# Patient Record
Sex: Male | Born: 1935 | Race: Black or African American | Hispanic: No | Smoking: Former smoker
Health system: Southern US, Community
[De-identification: ages and names within clinical notes are randomized; demographics above are authoritative.]

## PROBLEM LIST (undated history)

## (undated) DIAGNOSIS — E785 Hyperlipidemia, unspecified: Secondary | ICD-10-CM

## (undated) DIAGNOSIS — Z720 Tobacco use: Secondary | ICD-10-CM

## (undated) DIAGNOSIS — I2510 Atherosclerotic heart disease of native coronary artery without angina pectoris: Secondary | ICD-10-CM

## (undated) DIAGNOSIS — M1990 Unspecified osteoarthritis, unspecified site: Secondary | ICD-10-CM

## (undated) DIAGNOSIS — I739 Peripheral vascular disease, unspecified: Secondary | ICD-10-CM

## (undated) DIAGNOSIS — I219 Acute myocardial infarction, unspecified: Secondary | ICD-10-CM

## (undated) DIAGNOSIS — I10 Essential (primary) hypertension: Secondary | ICD-10-CM

## (undated) DIAGNOSIS — D649 Anemia, unspecified: Secondary | ICD-10-CM

## (undated) DIAGNOSIS — N184 Chronic kidney disease, stage 4 (severe): Secondary | ICD-10-CM

## (undated) DIAGNOSIS — I214 Non-ST elevation (NSTEMI) myocardial infarction: Secondary | ICD-10-CM

## (undated) HISTORY — DX: Essential (primary) hypertension: I10

## (undated) HISTORY — DX: Unspecified osteoarthritis, unspecified site: M19.90

## (undated) HISTORY — DX: Hyperlipidemia, unspecified: E78.5

## (undated) HISTORY — PX: OTHER SURGICAL HISTORY: SHX169

## (undated) HISTORY — DX: Non-ST elevation (NSTEMI) myocardial infarction: I21.4

## (undated) HISTORY — PX: REPAIR OF PERFORATED ULCER: SHX6065

---

## 2012-02-06 ENCOUNTER — Encounter: Payer: Self-pay | Admitting: Physician Assistant

## 2012-02-06 ENCOUNTER — Inpatient Hospital Stay (HOSPITAL_COMMUNITY)
Admission: AD | Admit: 2012-02-06 | Discharge: 2012-02-12 | DRG: 247 | Disposition: A | Discharge status: Home / Self Care | Payer: Medicare Other | Source: Other Acute Inpatient Hospital | Attending: Cardiology | Admitting: Cardiology

## 2012-02-06 ENCOUNTER — Other Ambulatory Visit: Payer: Self-pay | Admitting: Physician Assistant

## 2012-02-06 ENCOUNTER — Other Ambulatory Visit: Payer: Self-pay | Admitting: Cardiology

## 2012-02-06 DIAGNOSIS — I739 Peripheral vascular disease, unspecified: Secondary | ICD-10-CM

## 2012-02-06 DIAGNOSIS — D638 Anemia in other chronic diseases classified elsewhere: Secondary | ICD-10-CM

## 2012-02-06 DIAGNOSIS — I214 Non-ST elevation (NSTEMI) myocardial infarction: Secondary | ICD-10-CM

## 2012-02-06 DIAGNOSIS — I129 Hypertensive chronic kidney disease with stage 1 through stage 4 chronic kidney disease, or unspecified chronic kidney disease: Secondary | ICD-10-CM | POA: Diagnosis present

## 2012-02-06 DIAGNOSIS — I200 Unstable angina: Secondary | ICD-10-CM

## 2012-02-06 DIAGNOSIS — I2510 Atherosclerotic heart disease of native coronary artery without angina pectoris: Principal | ICD-10-CM | POA: Diagnosis present

## 2012-02-06 DIAGNOSIS — N179 Acute kidney failure, unspecified: Secondary | ICD-10-CM | POA: Diagnosis present

## 2012-02-06 DIAGNOSIS — N184 Chronic kidney disease, stage 4 (severe): Secondary | ICD-10-CM

## 2012-02-06 DIAGNOSIS — N039 Chronic nephritic syndrome with unspecified morphologic changes: Secondary | ICD-10-CM | POA: Diagnosis present

## 2012-02-06 DIAGNOSIS — D631 Anemia in chronic kidney disease: Secondary | ICD-10-CM | POA: Diagnosis present

## 2012-02-06 DIAGNOSIS — E872 Acidosis, unspecified: Secondary | ICD-10-CM | POA: Diagnosis present

## 2012-02-06 DIAGNOSIS — E785 Hyperlipidemia, unspecified: Secondary | ICD-10-CM

## 2012-02-06 DIAGNOSIS — Z720 Tobacco use: Secondary | ICD-10-CM

## 2012-02-06 DIAGNOSIS — F17200 Nicotine dependence, unspecified, uncomplicated: Secondary | ICD-10-CM | POA: Diagnosis present

## 2012-02-06 DIAGNOSIS — Z955 Presence of coronary angioplasty implant and graft: Secondary | ICD-10-CM

## 2012-02-06 DIAGNOSIS — R079 Chest pain, unspecified: Secondary | ICD-10-CM

## 2012-02-06 DIAGNOSIS — Z7982 Long term (current) use of aspirin: Secondary | ICD-10-CM

## 2012-02-06 DIAGNOSIS — I10 Essential (primary) hypertension: Secondary | ICD-10-CM

## 2012-02-06 DIAGNOSIS — Z79899 Other long term (current) drug therapy: Secondary | ICD-10-CM

## 2012-02-06 DIAGNOSIS — I959 Hypotension, unspecified: Secondary | ICD-10-CM | POA: Diagnosis present

## 2012-02-06 HISTORY — DX: Anemia, unspecified: D64.9

## 2012-02-06 HISTORY — DX: Peripheral vascular disease, unspecified: I73.9

## 2012-02-06 HISTORY — DX: Hyperlipidemia, unspecified: E78.5

## 2012-02-06 HISTORY — DX: Atherosclerotic heart disease of native coronary artery without angina pectoris: I25.10

## 2012-02-06 HISTORY — DX: Tobacco use: Z72.0

## 2012-02-06 HISTORY — DX: Chronic kidney disease, stage 4 (severe): N18.4

## 2012-02-06 MED ORDER — ASPIRIN 81 MG PO CHEW
324.0000 mg | CHEWABLE_TABLET | ORAL | Status: AC
  Administered 2012-02-09: 324 mg via ORAL
  Filled 2012-02-06: qty 4

## 2012-02-06 MED ORDER — HEPARIN (PORCINE) IN NACL 100-0.45 UNIT/ML-% IJ SOLN
1000.0000 [IU]/h | INTRAMUSCULAR | Status: DC
  Administered 2012-02-06: 1000 [IU]/h via INTRAVENOUS
  Administered 2012-02-07 – 2012-02-08 (×2): 1200 [IU]/h via INTRAVENOUS
  Filled 2012-02-06 (×4): qty 250

## 2012-02-06 MED ORDER — ATORVASTATIN CALCIUM 80 MG PO TABS
80.0000 mg | ORAL_TABLET | ORAL | Status: AC
  Administered 2012-02-06: 80 mg via ORAL
  Filled 2012-02-06: qty 1

## 2012-02-06 MED ORDER — AMLODIPINE BESYLATE 10 MG PO TABS
10.0000 mg | ORAL_TABLET | Freq: Every day | ORAL | Status: DC
  Administered 2012-02-07 – 2012-02-12 (×6): 10 mg via ORAL
  Filled 2012-02-06 (×6): qty 1

## 2012-02-06 MED ORDER — ACETAMINOPHEN 325 MG PO TABS
650.0000 mg | ORAL_TABLET | ORAL | Status: DC | PRN
  Administered 2012-02-07: 650 mg via ORAL
  Filled 2012-02-06: qty 2

## 2012-02-06 MED ORDER — SODIUM CHLORIDE 0.9 % IJ SOLN
3.0000 mL | Freq: Two times a day (BID) | INTRAMUSCULAR | Status: DC
  Administered 2012-02-06 – 2012-02-10 (×7): 3 mL via INTRAVENOUS

## 2012-02-06 MED ORDER — SODIUM CHLORIDE 0.9 % IV SOLN: 250.0000 mL | INTRAVENOUS | Status: DC | PRN

## 2012-02-06 MED ORDER — ASPIRIN EC 81 MG PO TBEC
81.0000 mg | DELAYED_RELEASE_TABLET | Freq: Every day | ORAL | Status: DC
  Administered 2012-02-07 – 2012-02-12 (×5): 81 mg via ORAL
  Filled 2012-02-06 (×6): qty 1

## 2012-02-06 MED ORDER — ATORVASTATIN CALCIUM 80 MG PO TABS
80.0000 mg | ORAL_TABLET | Freq: Every day | ORAL | Status: AC
  Administered 2012-02-07 – 2012-02-09 (×3): 80 mg via ORAL
  Filled 2012-02-06 (×3): qty 1

## 2012-02-06 MED ORDER — ATORVASTATIN CALCIUM 80 MG PO TABS
80.0000 mg | ORAL_TABLET | Freq: Every day | ORAL | Status: DC
  Administered 2012-02-10 – 2012-02-11 (×2): 80 mg via ORAL
  Filled 2012-02-06 (×4): qty 1

## 2012-02-06 MED ORDER — METOPROLOL TARTRATE 25 MG PO TABS
25.0000 mg | ORAL_TABLET | Freq: Two times a day (BID) | ORAL | Status: DC
  Administered 2012-02-06 – 2012-02-12 (×12): 25 mg via ORAL
  Filled 2012-02-06 (×9): qty 1
  Filled 2012-02-06: qty 2
  Filled 2012-02-06 (×4): qty 1

## 2012-02-06 MED ORDER — NITROGLYCERIN 0.4 MG SL SUBL
0.4000 mg | SUBLINGUAL_TABLET | SUBLINGUAL | Status: DC | PRN
  Administered 2012-02-07 (×2): 0.4 mg via SUBLINGUAL

## 2012-02-06 MED ORDER — SODIUM CHLORIDE 0.9 % IV SOLN
INTRAVENOUS | Status: DC
  Administered 2012-02-06: 50 mL via INTRAVENOUS
  Administered 2012-02-07: 15:00:00 via INTRAVENOUS
  Administered 2012-02-08: 50 mL via INTRAVENOUS
  Administered 2012-02-09: 06:00:00 via INTRAVENOUS

## 2012-02-06 MED ORDER — ONDANSETRON HCL 4 MG/2ML IJ SOLN: 4.0000 mg | Freq: Four times a day (QID) | INTRAMUSCULAR | Status: DC | PRN

## 2012-02-06 MED ORDER — SODIUM CHLORIDE 0.9 % IJ SOLN: 3.0000 mL | INTRAMUSCULAR | Status: DC | PRN

## 2012-02-06 NOTE — Progress Notes (Signed)
ANTICOAGULATION CONSULT NOTE - Initial Consult  Pharmacy Consult for heparin Indication: chect pain, r/o PE  Allergies not on file  Patient Measurements: Height: 5\' 6"  (167.6 cm) Weight: 185 lb (83.915 kg) IBW/kg (Calculated) : 63.8  Heparin Dosing Weight: 71 kg  Vital Signs: Temp: 98.6 F (37 C) (01/10 1543) Temp src: Oral (01/10 1543) BP: 143/59 mmHg (01/10 1543) Pulse Rate: 67  (01/10 1543)  Labs: No results found for this basename: HGB:2,HCT:3,PLT:3,APTT:3,LABPROT:3,INR:3,HEPARINUNFRC:3,CREATININE:3,CKTOTAL:3,CKMB:3,TROPONINI:3 in the last 72 hours  CrCl is unknown because no creatinine reading has been taken.   Medical History: No past medical history on file.  Medications:  Prescriptions prior to admission  Medication Sig Dispense Refill  . amLODipine (NORVASC) 10 MG tablet Take 10 mg by mouth daily.      Marland Kitchen aspirin EC 81 MG tablet Take 81 mg by mouth daily.      . calcium-vitamin D (OSCAL WITH D) 500-200 MG-UNIT per tablet Take 1 tablet by mouth 3 (three) times daily.      . hydrALAZINE (APRESOLINE) 25 MG tablet Take 25 mg by mouth 2 (two) times daily.      . metoprolol succinate (TOPROL-XL) 25 MG 24 hr tablet Take 25 mg by mouth daily.        Assessment: 79 yo man to start heparin for CP.  He received lovenox 90 mg this am at [facility].   VQ scan was low probability for PE.  CrCl ~ 42 ml/min. Goal of Therapy:  Heparin level 0.3-0.7 units/ml Monitor platelets by anticoagulation protocol: Yes   Plan:  Will start heparin with no bolus as received lovenox earlier today. Heparin drip at 1000 units/hr Check heparin level and CBC 8 hours after start and daily while on heparin.  Excell Seltzer Poteet 02/06/2012,4:22 PM

## 2012-02-06 NOTE — H&P (Signed)
Kevin [name], Kevin [name]  [id number]     Kevin [name]                    ROOM:          305-01                UNIT NUMBER:  [id number]                        LOCATION:      81F         ADM/VISIT DATE:     02/06/12                ADM PHYS:      Kevin Chroman MD       ACCT: [id number]                               DOB:           06-03-2030   CARDIOLOGY CONSULTATION REPORT   CONSULTING CARDIOLOGIST:  Kevin [name], M.D. (new).   REFERRING PHYSICIAN:  Jerene [name], M.D.   REASON FOR CONSULTATION:  Mr. Kevin [name] is a very pleasant 79 year old male, with no known history of heart disease, but several cardiac risk factors, including longstanding tobacco smoking, who presented to the ED late last night with the complaint of severe chest pain.  He is now referred to Kevin [name] for further evaluation.   Patient experienced sudden, severe midsternal chest pain (8/10) at 9:00 p.m. last evening, while he was sitting and watching television.  He referred to this as dull and was associated with radiation to the left biceps, as well as diaphoresis, dyspnea, nausea/vomiting.  He states that he had a similar episode 1 month ago, when he was briefly hospitalized here with acute/chronic renal failure and hyperkalemia.   Patient denies any antecedent history of exertional angina, recent development of exertional dyspnea.  His cardiac risk factors are notable for HTN, longstanding tobacco smoking, HLD, and age.  He denies ever having had a stress test.  Of note, during his recent hospitalization he did have an echocardiogram, reviewed by Kevin [name], which was essentially within normal limits.   Patient was essentially pain free by the time of arrival to the ED, and he was treated with morphine and Lovenox.  He presented with a blood pressure of 108/74.  He is currently pain free, and 1 set of cardiac markers was  drawn, notable for normal CPK-MB with a troponin I of 0.04.   Admission EKG notable for normal sinus rhythm with short PR interval, but no deltoid wave, and nonspecific ST changes.   ALLERGIES:  No known drug allergies.   HOME MEDICATIONS: 1.   Lopressor 25 mg daily. 2.   Gemfibrozil 600 mg b.i.d. 3.   Norvasc 10 mg daily. 4.   Aspirin 81 mg daily. 5.   Hydralazine 25 mg b.i.d.   PAST MEDICAL HISTORY: 1.   HTN. 2.   HLD. 3.  Tobacco. 4.   Chronic kidney disease.      a.   Followed by Kevin [name]. 5.   Chronic anemia. 6.   Ascending aorta dilatation, mild.           a.   2-D echocardiogram, 12/2011.   PAST SURGICAL HISTORY:  Bilateral carpal tunnel surgery.   SOCIAL HISTORY:  Patient lives here in [place], alone.  He has 5 children, 4 grandchildren and 2 great grandchildren.  He is a retired Programmer, systems.  He continues to smoke half a pack a day, and started at the age of 55. Denies alcohol use.   FAMILY HISTORY:  Negative for coronary artery disease.   REVIEW OF SYSTEMS:  Denies diabetes mellitus.  Has had longstanding occasional palpitations, for which he has never had a formal evaluation.  Denies history of MI, CHF, or stroke.  Denies intermittent claudication.  Remaining symptoms reviewed and are negative.   PHYSICAL EXAMINATION:  Vital signs:  Blood pressure currently 102/55, pulse 80s, regular; respirations 23, temperature afebrile, sats 100% on 2 L, weight 185 pounds.  General:  A 79 year old male, well nourished, well developed, lying supine, in no distress.  HEENT:  Normocephalic, atraumatic.  PERRLA.  EOMI.  Neck:  Palpable bilateral carotid bruits without bruits; no JVD at 30 degrees.  Lungs:  Clear to auscultation in all fields.  Heart:  Regular rate and rhythm.  No significant murmurs, rubs or gallops.  Abdomen:  Soft, nontender with intact bowel sounds.  No abdominal bruits.  Extremities:  Palpable bilateral femoral pulses with bilateral bruits  (R greater than L); nonpalpable PT/DPs.  No peripheral edema.  Skin:  Warm and dry.  Musculoskeletal:  No obvious abnormality.  Neurologic:  No focal deficits.   IMAGING: 1.   Chest x-ray:  No acute changes. 2.   Admission EKG:  NSR at 92 BPM; left anterior descending artery; short PR      interval, no delta waves; nonspecific ST-T changes.   LABORATORY DATA:  CPK 56/1.2, troponin I 0.04 (no prior studies for comparison).  INR 1.0, D-dimer 4.7, BNP 160, elevated alkaline phosphatase 138, AST 111, and ALT 63, otherwise normal LFTs.  Sodium 136, potassium 4.3, BUN 33, creatinine 1.9 (GFR 42), glucose 97 and pCO2 17.  WBC 4400, hemoglobin 10.3, hematocrit 33 (MCV 93) and platelets 148,000.  IMPRESSION: 1.   Chest pain.      a.   Typical/atypical features, consider unstable angina. 2.   Multiple cardiac risk factors.      a.   HTN.      b.   HLD.      c.   Tobacco, longstanding.      d.   Age.      [name].   Probable PAD. 3.   Normal LV function.      a.   2-D echocardiogram, 12/2011. 4.   Chronic kidney disease. 5.   Chronic anemia. 6.   Elevated D-dimer. VQ scan pending. 7.   Abnormal LFTs. 8.   Hypotension. 9.   Femoral bruits, bilateral. Probable PAD.   PLAN: 1.   Continue cycling cardiac markers and, if negative for MI, then      recommendation would be to proceed with a stress test for risk      stratification, and rule out of underlying ischemia.  If, however, cardiac      markers are abnormal and suggestive of MI, and if a V/Q scan is low  probability for PE, then we will need to consider more aggressive      evaluation with diagnostic coronary angiography.  Of note, however,      patient is at increased risk for contrast-induced nephropathy, given his      underlying chronic renal disease. 2.   Recommend initiation of low-dose aspirin.  Due to patient's current relative hypotension, we are unable to continue treatment with beta blocker.                                                 ______________________________                                             Kevin Mask, PA-C                                                                                                      ______________________________                                             Kevin Lesches, MD, [facility]   Attending note:  The patient was seen and examined.  Please refer to the complete consultation note by Kevin [name] for details.  We discussed the case and plan prior to dictation.  In brief summary, Kevin [name] is a 79 year old male with a longstanding history of tobacco abuse, hypertension, hyperlipidemia, chronic kidney disease followed by Kevin [name], and possible peripheral arterial disease based on current examination with finding of femoral bruits.  He presented to [facility] with recurring chest pain, describing the sudden onset of a severe midsternal discomfort at rest, radiation to the left biceps, subsequently diaphoresis, shortness of breath, and nausea with emesis.  He states in retrospect that he has had more mild episodes over the last few months, also with prior hospitalization at [facility] in December.   He did have an echocardiogram done in December demonstrating normal left ventricular function, although has not undergone any ischemic evaluation as yet.  He was found to have an elevated D-dimer of 4.7, prompting Dr. Woody [name] to arrange a ventilation perfusion lung scan today which was low probability for pulmonary embolus.  Otherwise, we were consulted to assist with his evaluation.   On examination this morning he was comfortable without active chest pain.  Vital signs:  Blood pressure 102/55, heart rate in the 80s in sinus rhythm.  Lungs:  Clear and nonlabored.  Cardiac:  With regular rate and rhythm.  No gallop.  No pitting edema.   Laboratory data showed initial troponin I of 0.04, subsequently found to be increased to 0.15.  BNP 160, AST 111, ALT 63,  potassium 4.3, BUN 33, creatinine 1.9 with GFR  of 42, hemoglobin 10.3, and platelets 148,000.  ECG reviewed showing sinus rhythm with PAC, nonspecific T wave changes.  Previous tracings also reviewed without acute ST segment changes, short PR interval visualized as well.  His chest x-ray reports no acute findings.   Concern based on symptom description in the setting of significantly increased cardiac risk factor profile and mild increase in troponin I at this point with nonspecific ECG is acute coronary syndrome/unstable angina. He did have a low probability ventilation perfusion lung scan with initially elevated D-dimer   I had a detailed discussion with the patient and several family members and friends in the room regarding these concerns, possibility of further cardiac testing to include both noninvasive and invasive techniques, also risks for contrast-induced nephropathy, and potentially even hemodialysis, although not a high likelihood of the latter based on his current renal function.  I have recommended that we transfer him to our cardiology service at [facility] to receive further medication modifications, obtain nephrology consultation for further recommendations, and tentatively to be scheduled for a diagnostic cardiac catheterization on Monday with limited contrast dye and no left ventriculogram to best define his coronary anatomy and determine if revascularization would be indicated.  The patient and family members/friends present were all in agreement, some taking copious notes and another appearing to record the entire conversation on his cell phone.  We are making arrangements to have him transferred via [place]. Further plans can be directed by our inpatient cardiology service at [facility] going forward.                                                 ______________________________                                                Kevin Lesches, MD, [facility]

## 2012-02-07 DIAGNOSIS — I200 Unstable angina: Secondary | ICD-10-CM

## 2012-02-07 DIAGNOSIS — E785 Hyperlipidemia, unspecified: Secondary | ICD-10-CM

## 2012-02-07 DIAGNOSIS — F17200 Nicotine dependence, unspecified, uncomplicated: Secondary | ICD-10-CM

## 2012-02-07 DIAGNOSIS — I10 Essential (primary) hypertension: Secondary | ICD-10-CM

## 2012-02-07 DIAGNOSIS — I739 Peripheral vascular disease, unspecified: Secondary | ICD-10-CM

## 2012-02-07 DIAGNOSIS — D638 Anemia in other chronic diseases classified elsewhere: Secondary | ICD-10-CM

## 2012-02-07 DIAGNOSIS — N184 Chronic kidney disease, stage 4 (severe): Secondary | ICD-10-CM

## 2012-02-07 DIAGNOSIS — Z720 Tobacco use: Secondary | ICD-10-CM

## 2012-02-07 LAB — URINALYSIS, ROUTINE W REFLEX MICROSCOPIC
Bilirubin Urine: NEGATIVE
Glucose, UA: NEGATIVE mg/dL
Leukocytes, UA: NEGATIVE
Nitrite: NEGATIVE
Specific Gravity, Urine: 1.014 (ref 1.005–1.030)
pH: 5 (ref 5.0–8.0)

## 2012-02-07 LAB — COMPREHENSIVE METABOLIC PANEL
AST: 42 U/L — ABNORMAL HIGH (ref 0–37)
BUN: 44 mg/dL — ABNORMAL HIGH (ref 6–23)
CO2: 18 mEq/L — ABNORMAL LOW (ref 19–32)
Calcium: 8.2 mg/dL — ABNORMAL LOW (ref 8.4–10.5)
Chloride: 104 mEq/L (ref 96–112)
Creatinine, Ser: 2.54 mg/dL — ABNORMAL HIGH (ref 0.50–1.35)
GFR calc Af Amer: 26 mL/min — ABNORMAL LOW (ref >90)
GFR calc non Af Amer: 22 mL/min — ABNORMAL LOW (ref >90)
Glucose, Bld: 92 mg/dL (ref 70–99)
Total Bilirubin: 0.3 mg/dL (ref 0.3–1.2)

## 2012-02-07 LAB — LIPID PANEL
Cholesterol: 132 mg/dL (ref 0–200)
HDL: 35 mg/dL — ABNORMAL LOW (ref >39)
LDL Cholesterol: 75 mg/dL (ref 0–99)
Triglycerides: 108 mg/dL (ref <150)

## 2012-02-07 LAB — CBC
HCT: 27.9 % — ABNORMAL LOW (ref 39.0–52.0)
Hemoglobin: 9.1 g/dL — ABNORMAL LOW (ref 13.0–17.0)
MCH: 30.2 pg (ref 26.0–34.0)
MCHC: 32.6 g/dL (ref 30.0–36.0)
RBC: 3.01 MIL/uL — ABNORMAL LOW (ref 4.22–5.81)

## 2012-02-07 LAB — HEPARIN LEVEL (UNFRACTIONATED)
Heparin Unfractionated: 0.45 IU/mL (ref 0.30–0.70)
Heparin Unfractionated: 0.21 IU/mL — ABNORMAL LOW (ref 0.30–0.70)

## 2012-02-07 LAB — TROPONIN I: Troponin I: <0.3 ng/mL (ref <0.30)

## 2012-02-07 MED ORDER — MORPHINE SULFATE 2 MG/ML IJ SOLN: 2.0000 mg | Freq: Every day | INTRAMUSCULAR | Status: DC | PRN

## 2012-02-07 MED ORDER — NITROGLYCERIN 2 % TD OINT
0.5000 [in_us] | TOPICAL_OINTMENT | Freq: Four times a day (QID) | TRANSDERMAL | Status: DC
  Administered 2012-02-07 – 2012-02-10 (×10): 0.5 [in_us] via TOPICAL
  Filled 2012-02-07: qty 30

## 2012-02-07 MED ORDER — MORPHINE BOLUS VIA INFUSION: 2.0000 mg | Freq: Every day | INTRAVENOUS | Status: DC | PRN

## 2012-02-07 MED ORDER — MORPHINE SULFATE 2 MG/ML IJ SOLN
2.0000 mg | INTRAMUSCULAR | Status: DC | PRN
  Administered 2012-02-07: 2 mg via INTRAVENOUS
  Filled 2012-02-07: qty 1

## 2012-02-07 NOTE — Progress Notes (Signed)
ANTICOAGULATION CONSULT NOTE - Follow Up Consult  Pharmacy Consult for Heparin Indication: chest pain/ACS  No Known Allergies  Patient Measurements: Height: 5\' 6"  (167.6 cm) Weight: 185 lb (83.915 kg) IBW/kg (Calculated) : 63.8  Heparin Dosing Weight: 81 kg  Vital Signs: Temp: 97.9 F (36.6 C) (01/11 0416) Temp src: Oral (01/11 0416) BP: 127/65 mmHg (01/11 1100) Pulse Rate: 60  (01/11 1100)  Labs:  Basename 02/07/12 1200 02/07/12 0730 02/07/12 0630 02/07/12 0059  HGB -- 9.1* -- --  HCT -- 27.9* -- --  PLT -- 133* -- --  APTT -- -- -- --  LABPROT -- -- -- --  INR -- -- -- --  HEPARINUNFRC 0.21* -- -- 0.45  CREATININE -- 2.54* -- --  CKTOTAL -- -- -- --  CKMB -- -- -- --  TROPONINI -- -- <0.30 --    Estimated Creatinine Clearance: 23.9 ml/min (by C-G formula based on Cr of 2.54).   Medications:  Infusions:    . sodium chloride 50 mL (02/06/12 1739)  . heparin 1,000 Units/hr (02/06/12 1734)    Assessment: 79 year old male who continues on anticoagulation with heparin for chest pain.  His heparin level is now subtherapeutic.  Goal of Therapy:  Heparin level 0.3-0.7 units/ml Monitor platelets by anticoagulation protocol: Yes   Plan:  Increase Heparin to 1200 units/hr Check heparin level in 8 hours  Legrand Como, Pharm.D., BCPS Clinical Pharmacist  Phone [phone] Pager [phone] 02/07/2012, 2:22 PM

## 2012-02-07 NOTE — Progress Notes (Signed)
Patient complained of midsternal chest pressure/pain. Vitals T 97.9, P 77, R 22, BP 172/73, 02 98 RA. EKG showed SR with nonspecific ST &T wave abnormalities. One sublingual nitrogylcerin given, 2L of oxygen applied to patient.  BP decreased to 124/63 after administration of the one sublingual nitroglycerin.   Dr. Raelyn Number on call notified and made aware. IV morphine ordered for patient. Patient expressed relief with the one sublingual nitroglycerin. Will continue to monitor patient.

## 2012-02-07 NOTE — Progress Notes (Signed)
[place] for heparin Indication: chest pain  No Known Allergies  Patient Measurements: Height: 5\' 6"  (167.6 cm) Weight: 185 lb (83.915 kg) IBW/kg (Calculated) : 63.8  Heparin Dosing Weight: 71 kg  Vital Signs: Temp: 98.5 F (36.9 C) (01/10 2100) Temp src: Oral (01/10 1543) BP: 108/56 mmHg (01/10 2146) Pulse Rate: 65  (01/10 2146)  Labs:  Basename 02/07/12 0059  HGB --  HCT --  PLT --  APTT --  LABPROT --  INR --  HEPARINUNFRC 0.45  CREATININE --  CKTOTAL --  CKMB --  TROPONINI --    CrCl is unknown because no creatinine reading has been taken.    Assessment: 79 yo man with chest pain for Heparin  Goal of Therapy:  Heparin level 0.3-0.7 units/ml Monitor platelets by anticoagulation protocol: Yes   Plan:  Continue Heparin at current rate  Recheck level later today to ensure heparin remains in therapeutic range  Briteny Fulghum, Bronson Curb 02/07/2012,2:19 AM

## 2012-02-07 NOTE — Progress Notes (Signed)
Primary cardiologist: Dr. Rozann Lesches (new as of 1/10)  Subjective:   Patient sitting up in room, family member present. Note that he has had some recurrent episodes of chest pain overnight that were nitroglycerin responsive. None this morning.   Objective:   Temp:  [97.9 F (36.6 C)-98.6 F (37 C)] 97.9 F (36.6 C) (01/11 0416) Pulse Rate:  [60-77] 60  (01/11 1100) Resp:  [18-22] 22  (01/11 0416) BP: (108-172)/(56-73) 127/65 mmHg (01/11 1100) SpO2:  [96 %-100 %] 98 % (01/11 0423) Weight:  [185 lb (83.915 kg)] 185 lb (83.915 kg) (01/10 1543) Last BM Date: 02/06/12  Filed Weights   02/06/12 1543  Weight: 185 lb (83.915 kg)    Intake/Output Summary (Last 24 hours) at 02/07/12 1141 Last data filed at 02/07/12 1100  Gross per 24 hour  Intake      3 ml  Output    300 ml  Net   -297 ml   Telemetry: Sinus rhythm.  Exam:  General: NAD.  Lungs: Clear, nonlabored.  Cardiac: RRR, no rub or gallop.  Abdomen: NABS.  Extremities: No pitting.  Lab Results:  Basic Metabolic Panel:  Lab [id number] 0730  NA 134*  K 4.9  CL 104  CO2 18*  GLUCOSE 92  BUN 44*  CREATININE 2.54*  CALCIUM 8.2*  MG --    Liver Function Tests:  Lab 02/07/12 0730  AST 42*  ALT 58*  ALKPHOS 120*  BILITOT 0.3  PROT 6.0  ALBUMIN 2.9*    CBC:  Lab 02/07/12 0730  WBC 11.9*  HGB 9.1*  HCT 27.9*  MCV 92.7  PLT 133*    Cardiac Enzymes:  Lab 02/07/12 0630  CKTOTAL --  CKMB --  CKMBINDEX --  TROPONINI <0.30    ECG: Sinus rhythm with nonspecific ST-T changes.   Medications:   Scheduled Medications:    . amLODipine  10 mg Oral Daily  . aspirin  324 mg Oral Pre-Cath  . aspirin EC  81 mg Oral Daily  . atorvastatin  80 mg Oral Q0600  . atorvastatin  80 mg Oral q1800  . metoprolol tartrate  25 mg Oral BID  . sodium chloride  3 mL Intravenous Q12H     Infusions:    . sodium chloride 50 mL (02/06/12 1739)  . heparin 1,000 Units/hr (02/06/12 1734)     PRN  Medications:  sodium chloride, acetaminophen, morphine injection, nitroGLYCERIN, ondansetron (ZOFRAN) IV, sodium chloride   Assessment:   1. Symptoms concerning for accelerating angina/ACS, minor increase in troponin I 0.15 at [facility] (normal here). He has had recurrent episodes of chest pain on heparin, nitroglycerin responsive, nonspecific ST-T changes by ECG.  2. Elevated d-dimer of 4.7 and [place], low probability VQ scan noted. Doubt pulmonary embolus.  3. Chronic kidney disease, stage 3-4, typically followed by Dr. Lowanda Foster. Episode of acute on chronic renal insufficiency with hyperkalemia back in December. Not on ACE inhibitor or ARB.  4. History of anemia chronic disease, current hemoglobin 9.1 down from 10.3 at [facility].  5. Hyperlipidemia, on statin therapy. LDL 75.  6. Possible PAD based on femoral bruits.  7. Long-standing history of tobacco abuse.  8. Hypertension, blood pressure currently controlled.  9. Echocardiogram from December at [facility] indicated normal LV function, mild aortic root dilatation.   Plan/Discussion:    Continue current medical regimen, add low-dose nitroglycerin paste. Also guaiac stools. Cycle further cardiac markers. Nephrology consultation is pending to assist with renal status as ultimately cardiac catheterization and  is planned. He is at high risk for contrast-induced nephropathy and is being gently hydrated at this time, not on ACE inhibitor or ARB, no other obvious nephrotoxic agents. Creatinine 1.9 and Morehead up to 2.5 at [facility].   Satira Sark, M.D., F.A.C.C.

## 2012-02-07 NOTE — Progress Notes (Signed)
Pt with a couple episodes of CP overnight.  EKG with nonspec ST changes.  Pain relieved with nitro.  Morphine ordered as back up in case nitro doesn't relieve pain.  Pt is on heparin.  Plan is for cath on Monday.  Pt currently CP free.

## 2012-02-08 ENCOUNTER — Encounter (HOSPITAL_COMMUNITY): Payer: Self-pay | Admitting: *Deleted

## 2012-02-08 ENCOUNTER — Inpatient Hospital Stay (HOSPITAL_COMMUNITY): Payer: Medicare Other

## 2012-02-08 LAB — BASIC METABOLIC PANEL
CO2: 16 mEq/L — ABNORMAL LOW (ref 19–32)
Chloride: 107 mEq/L (ref 96–112)
GFR calc Af Amer: 31 mL/min — ABNORMAL LOW (ref >90)
Potassium: 5.1 mEq/L (ref 3.5–5.1)

## 2012-02-08 LAB — CBC
Platelets: 126 10*3/uL — ABNORMAL LOW (ref 150–400)
RBC: 2.73 MIL/uL — ABNORMAL LOW (ref 4.22–5.81)
RDW: 13.9 % (ref 11.5–15.5)
WBC: 9.3 10*3/uL (ref 4.0–10.5)

## 2012-02-08 LAB — URINALYSIS, ROUTINE W REFLEX MICROSCOPIC
Glucose, UA: NEGATIVE mg/dL
Hgb urine dipstick: NEGATIVE
Ketones, ur: NEGATIVE mg/dL
Protein, ur: NEGATIVE mg/dL

## 2012-02-08 LAB — PROTEIN / CREATININE RATIO, URINE
Creatinine, Urine: 80.81 mg/dL
Total Protein, Urine: 9.2 mg/dL

## 2012-02-08 LAB — IRON AND TIBC
Iron: 17 ug/dL — ABNORMAL LOW (ref 42–135)
TIBC: 245 ug/dL (ref 215–435)

## 2012-02-08 LAB — TROPONIN I: Troponin I: <0.3 ng/mL (ref <0.30)

## 2012-02-08 LAB — HEPARIN LEVEL (UNFRACTIONATED): Heparin Unfractionated: 0.49 IU/mL (ref 0.30–0.70)

## 2012-02-08 MED ORDER — DOCUSATE SODIUM 100 MG PO CAPS
100.0000 mg | ORAL_CAPSULE | Freq: Two times a day (BID) | ORAL | Status: DC | PRN
  Filled 2012-02-08: qty 1

## 2012-02-08 NOTE — Progress Notes (Signed)
Primary cardiologist: Dr. Rozann Lesches (new as of 1/10)  Subjective:   Patient had recurrent recurrent chest pain overnight, treated with nitroglycerin and morphine, no pain this morning.   Objective:   Temp:  [98.6 F (37 C)-100.6 F (38.1 C)] 98.6 F (37 C) (01/12 0541) Pulse Rate:  [60-78] 69  (01/12 0541) Resp:  [18-20] 18  (01/12 0541) BP: (105-162)/(31-81) 155/77 mmHg (01/12 0541) SpO2:  [96 %-100 %] 96 % (01/12 0541) Weight:  [184 lb 8 oz (83.689 kg)] 184 lb 8 oz (83.689 kg) (01/12 0541) Last BM Date: 02/08/12  Filed Weights   02/06/12 1543 02/08/12 0541  Weight: 185 lb (83.915 kg) 184 lb 8 oz (83.689 kg)    Intake/Output Summary (Last 24 hours) at 02/08/12 0838 Last data filed at 02/08/12 0500  Gross per 24 hour  Intake      3 ml  Output    325 ml  Net   -322 ml   Telemetry: Sinus rhythm. PACs and 3 beat VT noted.  Exam:  General: NAD.  Lungs: Clear, nonlabored.  Cardiac: RRR, no rub or gallop.  Abdomen: NABS.  Extremities: No pitting.  Lab Results:  Basic Metabolic Panel:  Lab [id number] 0249 02/07/12 0730  NA 135 134*  K 5.1 4.9  CL 107 104  CO2 16* 18*  GLUCOSE 142* 92  BUN 45* 44*  CREATININE 2.21* 2.54*  CALCIUM 8.2* 8.2*  MG -- --    Liver Function Tests:  Lab 02/07/12 0730  AST 42*  ALT 58*  ALKPHOS 120*  BILITOT 0.3  PROT 6.0  ALBUMIN 2.9*    CBC:  Lab 02/08/12 0249 02/07/12 0730  WBC 9.3 11.9*  HGB 8.2* 9.1*  HCT 25.5* 27.9*  MCV 93.4 92.7  PLT 126* 133*    Cardiac Enzymes:  Lab 02/08/12 0250 02/07/12 2030 02/07/12 0630  CKTOTAL -- -- --  CKMB -- -- --  CKMBINDEX -- -- --  TROPONINI <0.30 <0.30 <0.30    ECG 1/12: Sinus rhythm.   Medications:   Scheduled Medications:    . amLODipine  10 mg Oral Daily  . aspirin  324 mg Oral Pre-Cath  . aspirin EC  81 mg Oral Daily  . atorvastatin  80 mg Oral Q0600  . atorvastatin  80 mg Oral q1800  . metoprolol tartrate  25 mg Oral BID  . nitroGLYCERIN  0.5  inch Topical Q6H  . sodium chloride  3 mL Intravenous Q12H    Infusions:    . sodium chloride 50 mL/hr at 02/07/12 1459  . heparin 1,200 Units/hr (02/07/12 1733)    PRN Medications: sodium chloride, acetaminophen, docusate sodium, morphine injection, nitroGLYCERIN, ondansetron (ZOFRAN) IV, sodium chloride   Assessment:   1. Symptoms concerning for accelerating angina/ACS, minor increase in troponin I 0.15 at [facility] (normal here). He has had recurrent episodes of chest pain on heparin, nitroglycerin and morphine responsive, nonspecific ST-T changes by ECG. Monitor shows sinus rhythm with occasional PACs, brief 3 beat VT.  2. Elevated d-dimer of 4.7 and [place], low probability VQ scan noted. Doubt pulmonary embolus.  3. Chronic kidney disease, stage 3-4, typically followed by Dr. Lowanda Foster. Episode of acute on chronic renal insufficiency with hyperkalemia back in December. Not on ACE inhibitor or ARB. Appreciate nephrology consult by Dr. Justin Mend.  4. History of anemia chronic disease, current hemoglobin 8.2 down from 10.3 at [facility]. Stool guaiacs pending, has also been consistently hydrated during this time. Iron studies pending. MCV normal.  5. Hyperlipidemia,  on statin therapy. LDL 75.  6. Possible PAD based on femoral bruits.  7. Long-standing history of tobacco abuse.  8. Hypertension, blood pressure currently controlled.  9. Echocardiogram from December at [facility] indicated normal LV function, mild aortic root dilatation.   Plan/Discussion:    Discussed with patient this morning. Continue medical therapy as outlined. Ultimately anticipate diagnostic cardiac catheterization with very limited dye load (no ventriculogram) to assess coronary anatomy in light of recurring episodes of unstable angina. I have not scheduled this procedure yet in light of the patient's acute on chronic renal insufficiency. Appreciate nephrology consultation with recommendations. Plan to continue  hydration at this point, creatinine is down from 2.5-2.2, although I suspect his baseline is lower. Creatinine was 1.9 at [facility]. He does have in addition a downward trend in his hemoglobin with history of chronic anemia, stool guaiacs pending. Question whether his ongoing hydration is also related. Iron studies recommended by nephrology, MCV is normal. Decision regarding proceeding with cardiac catheterization can be made on a day by day basis depending on his overall clinical status and renal function by followup lab work.  Satira Sark, M.D., F.A.C.C.

## 2012-02-08 NOTE — Progress Notes (Signed)
ANTICOAGULATION CONSULT NOTE - Follow Up Consult  Pharmacy Consult for Heparin Indication: chest pain/ACS  No Known Allergies  Patient Measurements: Height: 5\' 6"  (167.6 cm) Weight: 184 lb 8 oz (83.689 kg) IBW/kg (Calculated) : 63.8  Heparin Dosing Weight: 81 kg  Vital Signs: Temp: 98.6 F (37 C) (01/12 0541) Temp src: Oral (01/12 0541) BP: 147/61 mmHg (01/12 0952) Pulse Rate: 69  (01/12 0952)  Labs:  Basename 02/08/12 0250 02/08/12 0249 02/08/12 0045 02/07/12 2030 02/07/12 1200 02/07/12 0730 02/07/12 0630  HGB -- 8.2* -- -- -- 9.1* --  HCT -- 25.5* -- -- -- 27.9* --  PLT -- 126* -- -- -- 133* --  APTT -- -- -- -- -- -- --  LABPROT -- -- -- -- -- -- --  INR -- -- -- -- -- -- --  HEPARINUNFRC -- 0.49 0.48 -- 0.21* -- --  CREATININE -- 2.21* -- -- -- 2.54* --  CKTOTAL -- -- -- -- -- -- --  CKMB -- -- -- -- -- -- --  TROPONINI <0.30 -- -- <0.30 -- -- <0.30    Estimated Creatinine Clearance: 27.5 ml/min (by C-G formula based on Cr of 2.21).   Medications:  Infusions:     . sodium chloride 50 mL (02/08/12 1000)  . heparin 1,200 Units/hr (02/07/12 1733)    Assessment: 79 year old male who continues on anticoagulation with heparin for chest pain.  His heparin level remains therapeutic.  Goal of Therapy:  Heparin level 0.3-0.7 units/ml Monitor platelets by anticoagulation protocol: Yes   Plan:  Continue Heparin at 1200 units/hr AM Heparin level and CBC  Legrand Como, Pharm.D., BCPS Clinical Pharmacist  Phone [phone] Pager [phone] 02/08/2012, 10:55 AM

## 2012-02-08 NOTE — Consult Note (Signed)
Referring Provider: No ref. provider found Primary Care Physician:  No primary provider on file. Primary Nephrologist:  Dr. Tracey Harries Reason for Consultation: Chronic renal insufficiency Stage 3 Will nee IV contrast  HPI: no known history of heart disease, but several cardiac risk factors, including  longstanding tobacco smoking, who presented to the ED. Needs cardiac catherization, no prior creatinines. No history of diabetes, history hypertension, smoking tobacco and prior NSAID use. No complaints of prostatism or renal calculi no FH of renal disease.  No past medical history on file.  No past surgical history on file.  Prior to Admission medications   Medication Sig Start Date End Date Taking? Authorizing Provider  amLODipine (NORVASC) 10 MG tablet Take 10 mg by mouth daily.   Yes Historical Provider, MD  aspirin EC 81 MG tablet Take 81 mg by mouth daily.   Yes Historical Provider, MD  calcium-vitamin D (OSCAL WITH D) 500-200 MG-UNIT per tablet Take 1 tablet by mouth 3 (three) times daily.   Yes Historical Provider, MD  hydrALAZINE (APRESOLINE) 25 MG tablet Take 25 mg by mouth 2 (two) times daily.   Yes Historical Provider, MD  metoprolol succinate (TOPROL-XL) 25 MG 24 hr tablet Take 25 mg by mouth daily.   Yes Historical Provider, MD    Current Facility-Administered Medications  Medication Dose Route Frequency Provider Last Rate Last Dose  . 0.9 %  sodium chloride infusion  250 mL Intravenous PRN Aurora Mask, PA      . 0.9 %  sodium chloride infusion   Intravenous Continuous Aurora Mask, PA 50 mL/hr at 02/07/12 1459    . acetaminophen (TYLENOL) tablet 650 mg  650 mg Oral Q4H PRN Aurora Mask, PA   650 mg at 02/07/12 1458  . amLODipine (NORVASC) tablet 10 mg  10 mg Oral Daily Aurora Mask, PA   10 mg at 02/07/12 1056  . aspirin chewable tablet 324 mg  324 mg Oral Pre-Cath Aurora Mask, PA      . aspirin EC tablet 81 mg  81 mg Oral Daily Aurora Mask, PA   81 mg at 02/07/12 1056  .  atorvastatin (LIPITOR) tablet 80 mg  80 mg Oral Q0600 Peter M Martinique, MD   80 mg at 02/08/12 0531  . atorvastatin (LIPITOR) tablet 80 mg  80 mg Oral q1800 Peter M Martinique, MD      . docusate sodium (COLACE) capsule 100 mg  100 mg Oral BID PRN Peter M Martinique, MD      . heparin ADULT infusion 100 units/mL (25000 units/250 mL)  1,200 Units/hr Intravenous Continuous Sandford Craze, PHARMD 12 mL/hr at 02/07/12 1733 1,200 Units/hr at 02/07/12 1733  . metoprolol tartrate (LOPRESSOR) tablet 25 mg  25 mg Oral BID Aurora Mask, PA   25 mg at 02/07/12 2156  . morphine 2 MG/ML injection 2 mg  2 mg Intravenous Q2H PRN Peter M Martinique, MD   2 mg at 02/07/12 2350  . nitroGLYCERIN (NITROGLYN) 2 % ointment 0.5 inch  0.5 inch Topical Q6H Satira Sark, MD   0.5 inch at 02/08/12 0531  . nitroGLYCERIN (NITROSTAT) SL tablet 0.4 mg  0.4 mg Sublingual Q5 Min x 3 PRN Aurora Mask, PA   0.4 mg at 02/07/12 2025  . ondansetron (ZOFRAN) injection 4 mg  4 mg Intravenous Q6H PRN Aurora Mask, PA      . sodium chloride 0.9 % injection 3 mL  3 mL Intravenous Q12H Aurora Mask, PA   3 mL at 02/07/12  2200  . sodium chloride 0.9 % injection 3 mL  3 mL Intravenous PRN Aurora Mask, PA        Allergies as of 02/06/2012  . (No Known Allergies)    No family history on file.  History   Social History  . Marital Status: Widowed    Spouse Name: N/A    Number of Children: N/A  . Years of Education: N/A   Occupational History  . Not on file.   Social History Main Topics  . Smoking status: Not on file  . Smokeless tobacco: Not on file  . Alcohol Use: Not on file  . Drug Use: Not on file  . Sexually Active: Not on file   Other Topics Concern  . Not on file   Social History Narrative  . No narrative on file    Review of Systems: Gen: Denies any fever, chills, sweats, anorexia, fatigue, weakness, malaise, weight loss, and sleep disorder HEENT: No visual complaints, No history of Retinopathy. Normal  external appearance No Epistaxis or Sore throat. No sinusitis.   CV: Chest pain since Thursday on minimal exercise also dyspnea and diaphoresis. Also complains of rest symptoms that are only partially controlled with nitroglycerin Resp: Denies dyspnea at rest, dyspnea with exercise, cough, sputum, wheezing, coughing up blood, and pleurisy. GI: Denies vomiting blood, jaundice, and fecal incontinence.   Denies dysphagia or odynophagia. Prior history of GI Bleed from ulcer disease Colonoscopy 2 years ago GU : Denies urinary burning, blood in urine, urinary frequency, urinary hesitancy, nocturnal urination, and urinary incontinence.  No renal calculi. [id number] in hips on ambulation Derm: Denies rash, itching, dry skin, hives, moles, warts, or unhealing ulcers.  Psych: Denies depression, anxiety, memory loss, suicidal ideation, hallucinations, paranoia, and confusion. Heme: Denies bruising, bleeding, and enlarged lymph nodes. Neuro: No headache.  No diplopia. No dysarthria.  No dysphasia.  No history of CVA.  No Seizures. No paresthesias.  No weakness. Endocrine No DM.  No Thyroid disease.  No Adrenal disease.  Physical Exam: Vital signs in last 24 hours: Temp:  [98.6 F (37 C)-100.6 F (38.1 C)] 98.6 F (37 C) (01/12 0541) Pulse Rate:  [60-78] 69  (01/12 0541) Resp:  [18-20] 18  (01/12 0541) BP: (105-162)/(31-81) 155/77 mmHg (01/12 0541) SpO2:  [96 %-100 %] 96 % (01/12 0541) Weight:  [83.689 kg (184 lb 8 oz)] 83.689 kg (184 lb 8 oz) (01/12 0541) Last BM Date: 02/06/12 General:   Alert,  Well-developed, well-nourished, pleasant and cooperative in NAD Head:  Normocephalic and atraumatic. Eyes:  Sclera clear, no icterus.   Conjunctiva pink. Ears:  Normal auditory acuity. Nose:  No deformity, discharge,  or lesions. Mouth:  No deformity or lesions, dentition normal. Neck:  Supple; no masses or thyromegaly. JVP not elevated Lungs:  Clear throughout to auscultation.   No wheezes, crackles,  or rhonchi. No acute distress. Heart:  Regular rate and rhythm; no murmurs, clicks, rubs,  or gallops. Abdomen:  Soft, nontender and nondistended.obese  No masses, hepatosplenomegaly or hernias noted. Normal bowel sounds, without guarding, and without rebound.   Msk:  Symmetrical without gross deformities. Normal posture. Pulses:  No carotid, renal,Faint femoral bruits. diminshed DP and PT symmetrical and equal Extremities:  Without clubbing or edema. Neurologic:  Alert and  oriented x4;  grossly normal neurologically. Skin:  Intact without significant lesions or rashes. Cervical Nodes:  No significant cervical adenopathy. Psych:  Alert and cooperative. Normal mood and affect.  Intake/Output from previous day: 01/11 0701 -  01/12 0700 In: 3 [I.V.:3] Out: 325 [Urine:325] Intake/Output this shift:    Lab Results:  Basename 02/08/12 0249 02/07/12 0730  WBC 9.3 11.9*  HGB 8.2* 9.1*  HCT 25.5* 27.9*  PLT 126* 133*   BMET  Basename 02/08/12 0249 02/07/12 0730  NA 135 134*  K 5.1 4.9  CL 107 104  CO2 16* 18*  GLUCOSE 142* 92  BUN 45* 44*  CREATININE 2.21* 2.54*  CALCIUM 8.2* 8.2*  PHOS -- --   LFT  Basename 02/07/12 0730  PROT 6.0  ALBUMIN 2.9*  AST 42*  ALT 58*  ALKPHOS 120*  BILITOT 0.3  BILIDIR --  IBILI --   PT/INR No results found for this basename: LABPROT:2,INR:2 in the last 72 hours Hepatitis Panel No results found for this basename: HEPBSAG,HCVAB,HEPAIGM,HEPBIGM in the last 72 hours  Studies/Results: No results found.  Assessment/Plan:  Chronic renal Failure most likely but will check U/A and Renal ultrasound. SPEP and UPEP. Urinalysis for protein creatinine ratio  HTN/VOL  Controlled with amlodipine and Metoprolol  Anemia will check iron studies  Bones check calcium phophorus and PTH  Patient is at risk of Contrast induced acute kidney injury, he appears well hydrated and could use mucomyst although studies have been inconclusive. Minimize dye  load.   LOS: 2 Reida Hem W @TODAY @8 :24 AM

## 2012-02-08 NOTE — Progress Notes (Signed)
ANTICOAGULATION CONSULT NOTE  Pharmacy Consult for Heparin Indication: chest pain/ACS  No Known Allergies  Patient Measurements: Height: 5\' 6"  (167.6 cm) Weight: 185 lb (83.915 kg) IBW/kg (Calculated) : 63.8  Heparin Dosing Weight: 81 kg  Vital Signs: Temp: 99.5 F (37.5 C) (01/11 2100) Temp src: Oral (01/11 1440) BP: 144/65 mmHg (01/11 2156) Pulse Rate: 69  (01/11 2156)  Labs:  Basename 02/08/12 0045 02/07/12 2030 02/07/12 1200 02/07/12 0730 02/07/12 0630 02/07/12 0059  HGB -- -- -- 9.1* -- --  HCT -- -- -- 27.9* -- --  PLT -- -- -- 133* -- --  APTT -- -- -- -- -- --  LABPROT -- -- -- -- -- --  INR -- -- -- -- -- --  HEPARINUNFRC 0.48 -- 0.21* -- -- 0.45  CREATININE -- -- -- 2.54* -- --  CKTOTAL -- -- -- -- -- --  CKMB -- -- -- -- -- --  TROPONINI -- <0.30 -- -- <0.30 --    Estimated Creatinine Clearance: 23.9 ml/min (by C-G formula based on Cr of 2.54).  Assessment: 79 year old male with chest pain for Heparin  Goal of Therapy:  Heparin level 0.3-0.7 units/ml Monitor platelets by anticoagulation protocol: Yes   Plan:  Continue Heparin at current rate   Phillis Knack, PharmD, BCPS  02/08/2012, 1:43 AM

## 2012-02-08 NOTE — Progress Notes (Addendum)
Pt c/o midsternal chest pressure/pain rating it a 9/10. Vital signs P 78, R 20, BP 162/81 02 100 2L N/C. EKG showed SR with short PR with premature atrial complexes, Nonspecific ST and T wave abnormality.  1 sublingual nitroglycerin given. Dr. On call notified and made aware. Will continue to monitor patient.

## 2012-02-09 ENCOUNTER — Encounter (HOSPITAL_COMMUNITY)
Admission: AD | Disposition: A | Discharge status: Home / Self Care | Payer: Self-pay | Source: Other Acute Inpatient Hospital | Attending: Cardiology

## 2012-02-09 ENCOUNTER — Encounter (HOSPITAL_COMMUNITY): Payer: Self-pay | Admitting: Nurse Practitioner

## 2012-02-09 ENCOUNTER — Inpatient Hospital Stay (HOSPITAL_COMMUNITY): Payer: Medicare Other

## 2012-02-09 DIAGNOSIS — I2510 Atherosclerotic heart disease of native coronary artery without angina pectoris: Secondary | ICD-10-CM

## 2012-02-09 HISTORY — PX: LEFT HEART CATHETERIZATION WITH CORONARY ANGIOGRAM: SHX5451

## 2012-02-09 LAB — POCT ACTIVATED CLOTTING TIME: Activated Clotting Time: 171 seconds

## 2012-02-09 LAB — CBC
Hemoglobin: 8.4 g/dL — ABNORMAL LOW (ref 13.0–17.0)
MCH: 29.9 pg (ref 26.0–34.0)
RBC: 2.81 MIL/uL — ABNORMAL LOW (ref 4.22–5.81)
WBC: 7.4 10*3/uL (ref 4.0–10.5)

## 2012-02-09 LAB — BASIC METABOLIC PANEL
CO2: 20 mEq/L (ref 19–32)
Chloride: 109 mEq/L (ref 96–112)
Glucose, Bld: 97 mg/dL (ref 70–99)
Potassium: 5.2 mEq/L — ABNORMAL HIGH (ref 3.5–5.1)
Sodium: 139 mEq/L (ref 135–145)

## 2012-02-09 LAB — PROTIME-INR
INR: 1.14 (ref 0.00–1.49)
Prothrombin Time: 14.4 seconds (ref 11.6–15.2)

## 2012-02-09 LAB — PARATHYROID HORMONE, INTACT (NO CA): PTH: 134.5 pg/mL — ABNORMAL HIGH (ref 14.0–72.0)

## 2012-02-09 SURGERY — LEFT HEART CATHETERIZATION WITH CORONARY ANGIOGRAM
Anesthesia: LOCAL

## 2012-02-09 MED ORDER — CLOPIDOGREL BISULFATE 75 MG PO TABS
300.0000 mg | ORAL_TABLET | Freq: Once | ORAL | Status: AC
  Administered 2012-02-09: 300 mg via ORAL
  Filled 2012-02-09: qty 4

## 2012-02-09 MED ORDER — DARBEPOETIN ALFA-POLYSORBATE 60 MCG/0.3ML IJ SOLN
60.0000 ug | INTRAMUSCULAR | Status: DC
  Administered 2012-02-10: 60 ug via SUBCUTANEOUS
  Filled 2012-02-09 (×3): qty 0.3

## 2012-02-09 MED ORDER — NITROGLYCERIN 0.2 MG/ML ON CALL CATH LAB
INTRAVENOUS | Status: AC
  Filled 2012-02-09: qty 1

## 2012-02-09 MED ORDER — SODIUM CHLORIDE 0.9 % IV SOLN: 1.0000 mL/kg/h | INTRAVENOUS | Status: AC

## 2012-02-09 MED ORDER — HEPARIN (PORCINE) IN NACL 100-0.45 UNIT/ML-% IJ SOLN
1050.0000 [IU]/h | INTRAMUSCULAR | Status: DC
  Administered 2012-02-09 – 2012-02-10 (×2): 1050 [IU]/h via INTRAVENOUS
  Filled 2012-02-09 (×4): qty 250

## 2012-02-09 MED ORDER — CLOPIDOGREL BISULFATE 75 MG PO TABS
75.0000 mg | ORAL_TABLET | Freq: Every day | ORAL | Status: DC
  Administered 2012-02-10 – 2012-02-12 (×3): 75 mg via ORAL
  Filled 2012-02-09 (×2): qty 1

## 2012-02-09 MED ORDER — MIDAZOLAM HCL 2 MG/2ML IJ SOLN
INTRAMUSCULAR | Status: AC
  Filled 2012-02-09: qty 2

## 2012-02-09 MED ORDER — FENTANYL CITRATE 0.05 MG/ML IJ SOLN
INTRAMUSCULAR | Status: AC
  Filled 2012-02-09: qty 2

## 2012-02-09 MED ORDER — FERUMOXYTOL INJECTION 510 MG/17 ML
1020.0000 mg | Freq: Once | INTRAVENOUS | Status: AC
  Administered 2012-02-09: 1020 mg via INTRAVENOUS
  Filled 2012-02-09: qty 34

## 2012-02-09 MED ORDER — LIDOCAINE HCL (PF) 1 % IJ SOLN
INTRAMUSCULAR | Status: AC
  Filled 2012-02-09: qty 30

## 2012-02-09 MED ORDER — HEPARIN (PORCINE) IN NACL 2-0.9 UNIT/ML-% IJ SOLN
INTRAMUSCULAR | Status: AC
  Filled 2012-02-09: qty 1000

## 2012-02-09 MED ORDER — SODIUM BICARBONATE 650 MG PO TABS
650.0000 mg | ORAL_TABLET | Freq: Two times a day (BID) | ORAL | Status: DC
  Administered 2012-02-09 – 2012-02-12 (×7): 650 mg via ORAL
  Filled 2012-02-09 (×9): qty 1

## 2012-02-09 NOTE — Progress Notes (Signed)
I reviewed cardiac cath findings in detail with the patient and his family. The options are to stent the LAD and treat his other disease medically versus consider CABG. I think his best option is to have PCI. The distal RCA is a poor target for CABG and the distal LCX disease is also small for grafting. I think PCI would carry less risk due to his age, CKD, and other co-morbidities. With stenting of the LAD he should do very well. I would like to monitor his renal function over the next 48 hours. If it remains stable we will plan PCI on Wednesday. Will go ahead and load with Plavix. Patient and family understand options and risks and are willing to proceed.  Kimberleigh Mehan Martinique MD, [facility]  02/09/2012 3:58 PM

## 2012-02-09 NOTE — CV Procedure (Signed)
   Cardiac Catheterization Procedure Note  Name: Kevin [name] MRN: [id number] DOB: 10/06/1930  Procedure: Left Heart Cath, Selective Coronary Angiography  Indication: 79 year old black male with history of chronic kidney disease stage IV, hypertension, tobacco abuse, and hyperlipidemia presents with unstable angina.   Procedural details: The right groin was prepped, draped, and anesthetized with 1% lidocaine. Using modified Seldinger technique, a 5 French sheath was introduced into the right femoral artery. Standard Judkins catheters were used for coronary angiography. Catheter exchanges were performed over a guidewire. There were no immediate procedural complications. The patient was transferred to the post catheterization recovery area for further monitoring. 50 cc of Omnipaque was used.  Procedural Findings: Hemodynamics:  AO 153/60 with a mean of 96 mmHg LV 152/18 mmHg   Coronary angiography: Coronary dominance: right  Left mainstem: The left main coronary has mild irregularities less than 10%.  Left anterior descending (LAD): The LAD has a 70-80% stenosis proximally followed by a 90-95% stenosis at the bifurcation with the second diagonal branch. The first diagonal branch is without significant disease.  Left circumflex (LCx): The left circumflex is a large vessel. It has a 30% stenosis in the mid vessel. There is a 70% focal stenosis prior to the takeoff of the second marginal branch. The distal circumflex on the posterior lateral wall has a 90% stenosis.  Right coronary artery (RCA): The right coronary arises and distributes normally. Has a 90% stenosis in the mid vessel and is then occluded at the crux. There are left to right collaterals to the distal RCA.  Left ventriculography: Not performed  Final Conclusions:   1. Severe three-vessel obstructive coronary disease.   Recommendations: We will discuss revascularization options with the patient. These include coronary  bypass surgery versus percutaneous intervention of the LAD only.  Collier Salina [facility] 02/09/2012, 12:19 PM

## 2012-02-09 NOTE — Interval H&P Note (Signed)
History and Physical Interval Note:  02/09/2012 11:47 AM  Kevin [name]  has presented today for surgery, with the diagnosis of CP  The various methods of treatment have been discussed with the patient and family. After consideration of risks, benefits and other options for treatment, the patient has consented to  Procedure(s) (LRB) with comments: LEFT HEART CATHETERIZATION WITH CORONARY ANGIOGRAM (N/A) as a surgical intervention .  The patient's history has been reviewed, patient examined, no change in status, stable for surgery.  I have reviewed the patient's chart and labs.  Questions were answered to the patient's satisfaction.     Collier Salina [facility] 02/09/2012 11:48 AM

## 2012-02-09 NOTE — Progress Notes (Signed)
Patient ID: Kevin [name], male   DOB: [birth year], 79 y.o.   MRN: [id number]   [facility] Progress Note    Subjective:   Reports mild chest pain overnight otherwise asymptomatic. Denies any emergent shortness of breath.    Objective:   BP 136/65  Pulse 55  Temp 98.6 F (37 C) (Oral)  Resp 16  Ht 5\' 6"  (1.676 m)  Wt 83.689 kg (184 lb 8 oz)  BMI 29.78 kg/m2  SpO2 99%  Intake/Output Summary (Last 24 hours) at 02/09/12 0753 Last data filed at 02/09/12 0500  Gross per 24 hour  Intake      3 ml  Output   1100 ml  Net  -1097 ml   Weight change:   Physical Exam: Gen: Comfortably resting in bed-laying flat on his back. CVS: Pulse regular bradycardia, heart sounds S1 and S2 normal Resp: Clear to auscultation bilaterally, no rales/rhonchi Abd: Soft, obese, nontender and bowel sounds are normal Ext: Trace to 1+ bipedal ankle edema  Imaging: No results found.  Labs: BMET  Lab 02/08/12 0249 02/07/12 0730  NA 135 134*  K 5.1 4.9  CL 107 104  CO2 16* 18*  GLUCOSE 142* 92  BUN 45* 44*  CREATININE 2.21* 2.54*  ALB -- --  CALCIUM 8.2* 8.2*  PHOS -- --   CBC  Lab 02/08/12 0249 02/07/12 0730  WBC 9.3 11.9*  NEUTROABS -- --  HGB 8.2* 9.1*  HCT 25.5* 27.9*  MCV 93.4 92.7  PLT 126* 133*    Medications:      . amLODipine  10 mg Oral Daily  . aspirin  324 mg Oral Pre-Cath  . aspirin EC  81 mg Oral Daily  . atorvastatin  80 mg Oral q1800  . metoprolol tartrate  25 mg Oral BID  . nitroGLYCERIN  0.5 inch Topical Q6H  . sodium chloride  3 mL Intravenous Q12H     Assessment/ Plan:   1. Chronic kidney disease stage IV with anticipated nephrotoxic exposure/contrast. Appropriate maneuvers have been taken to minimize contrast toxicity with intravenous fluids and anticipated low-dose contrast. According to the [facility] production model-he has a 25-30% chance of acute renal failure following contrast exposure and a 1-2% chance of needing dialysis. 2. Unstable  angina/acute coronary syndrome: Cardiology notes reviewed, anticipated coronary angiography today. Currently on nitroglycerin/morphine for symptomatic relief of chest pain. 3. Anemia: Suspect anemia of chronic kidney disease, we'll start ESA following the procedure. Suspect that his anemia may also be contributing to his angina. 4. Metabolic acidosis: Suspected this is chronic from underlying chronic kidney disease, start oral sodium bicarbonate therapy.   Elmarie Shiley, MD 02/09/2012, 7:53 AM

## 2012-02-09 NOTE — Progress Notes (Addendum)
ANTICOAGULATION CONSULT NOTE - Follow Up Consult  Pharmacy Consult for Heparin Indication: chest pain/ACS  No Known Allergies  Patient Measurements: Height: 5\' 6"  (167.6 cm) Weight: 184 lb 8 oz (83.689 kg) IBW/kg (Calculated) : 63.8  Heparin Dosing Weight: 81kg  Vital Signs: Temp: 98.6 F (37 C) (01/13 0500) BP: 136/65 mmHg (01/13 0500) Pulse Rate: 55  (01/13 0500)  Labs:  Basename 02/09/12 0805 02/08/12 0250 02/08/12 0249 02/08/12 0045 02/07/12 2030 02/07/12 0730 02/07/12 0630  HGB 8.4* -- 8.2* -- -- -- --  HCT 26.3* -- 25.5* -- -- 27.9* --  PLT 149* -- 126* -- -- 133* --  APTT -- -- -- -- -- -- --  LABPROT 14.4 -- -- -- -- -- --  INR 1.14 -- -- -- -- -- --  HEPARINUNFRC 0.95* -- 0.49 0.48 -- -- --  CREATININE 1.81* -- 2.21* -- -- 2.54* --  CKTOTAL -- -- -- -- -- -- --  CKMB -- -- -- -- -- -- --  TROPONINI -- <0.30 -- -- <0.30 -- <0.30    Estimated Creatinine Clearance: 33.6 ml/min (by C-G formula based on Cr of 1.81).   Medications:  Heparin 1200 units/hr  Assessment: 81yom on heparin for CP/ACS. Heparin level (0.95) has trended up to supratherapeutic levels. Lab drawn on opposite arm of infusion. Patient is scheduled for cath today - will decrease heparin rate and follow-up post cath orders. - H/H and Plts low but stable - No significant bleeding reported  Goal of Therapy:  Heparin level 0.3-0.7 units/ml Monitor platelets by anticoagulation protocol: Yes   Plan:  1. Decrease heparin drip to 1000 units/hr (10 ml/hr) 2. Follow-up post cath orders  [name] [id number] 02/09/2012,10:23 AM    Addendum:  Patient now s/p cath revealing severe multivessel CAD. Pharmacy consulted to restart heparin 8 hours post sheath removal while treatment options discussed. Heparin level was supratherapeutic (0.95) this AM on 1200 units/hr - most likely from accumulation with renal function.  - Sheath was removed ~1300 per Cath Note - No significant bleeding/cath  site appropriate per RN  Goal Heparin Level: 0.3-0.7  Plan: 1. Restart heparin drip 1050 units/hr (10.5 ml/hr) @ 2100 tonight 2. Follow-up AM heparin level and CBC  Janina Mayo, PharmD Clinical Pharmacist [phone] 02/09/2012, 3:34 PM

## 2012-02-09 NOTE — Progress Notes (Signed)
Patient Name: Kevin [name] Date of Encounter: 02/09/2012   Principal Problem:  *Accelerating angina Active Problems:  CKD (chronic kidney disease) stage 4, GFR 15-29 ml/min  Tobacco abuse  Essential hypertension, benign  Hyperlipidemia  Peripheral arterial disease  Anemia of chronic disease   SUBJECTIVE  No chest pain or sob overnight.  BMET pending this AM.  Tentatively scheduled for cath today.  CURRENT MEDS    . amLODipine  10 mg Oral Daily  . aspirin EC  81 mg Oral Daily  . atorvastatin  80 mg Oral q1800  . darbepoetin (ARANESP) injection - NON-DIALYSIS  60 mcg Subcutaneous Q Tue-1800  . ferumoxytol  1,020 mg Intravenous Once  . metoprolol tartrate  25 mg Oral BID  . nitroGLYCERIN  0.5 inch Topical Q6H  . sodium bicarbonate  650 mg Oral BID  . sodium chloride  3 mL Intravenous Q12H   OBJECTIVE  Filed Vitals:   02/08/12 1749 02/08/12 2100 02/08/12 2135 02/09/12 0500  BP: 160/67 130/71 130/71 136/65  Pulse: 62 62 60 55  Temp:  98.4 F (36.9 C)  98.6 F (37 C)  TempSrc:      Resp:  18  16  Height:      Weight:      SpO2:  98%  99%    Intake/Output Summary (Last 24 hours) at 02/09/12 0920 Last data filed at 02/09/12 0700  Gross per 24 hour  Intake      3 ml  Output   1100 ml  Net  -1097 ml   Filed Weights   02/06/12 1543 02/08/12 0541  Weight: 185 lb (83.915 kg) 184 lb 8 oz (83.689 kg)    PHYSICAL EXAM  General: Pleasant, NAD. Neuro: Alert and oriented X 3. Moves all extremities spontaneously. Psych: Normal affect. HEENT:  Normal  Neck: Supple without bruits or JVD. Lungs:  Resp regular and unlabored, CTA. Heart: RRR no s3, s4, or murmurs. Abdomen: Soft, non-tender, non-distended, BS + x 4.  Extremities: No clubbing, cyanosis or edema. DP/PT/Radials 1+ and equal bilaterally.  Bilat femoral bruits.  Accessory Clinical Findings  CBC  Basename 02/09/12 0805 02/08/12 0249  WBC 7.4 9.3  NEUTROABS -- --  HGB 8.4* 8.2*  HCT 26.3* 25.5*  MCV  93.6 93.4  PLT 149* 123XX123*   Basic Metabolic Panel  Basename [id number] 0249 02/07/12 0730  NA 135 134*  K 5.1 4.9  CL 107 104  CO2 16* 18*  GLUCOSE 142* 92  BUN 45* 44*  CREATININE 2.21* 2.54*  CALCIUM 8.2* 8.2*  MG -- --  PHOS -- --   Liver Function Tests  Basename 02/07/12 0730  AST 42*  ALT 58*  ALKPHOS 120*  BILITOT 0.3  PROT 6.0  ALBUMIN 2.9*   Cardiac Enzymes  Basename 02/08/12 0250 02/07/12 2030 02/07/12 0630  CKTOTAL -- -- --  CKMB -- -- --  CKMBINDEX -- -- --  TROPONINI <0.30 <0.30 <0.30   Fasting Lipid Panel  Basename 02/07/12 0630  CHOL 132  HDL 35*  LDLCALC 75  TRIG 108  CHOLHDL 3.8  LDLDIRECT --   TELE  Sb, pac's - 50's.  Radiology/Studies  No results found.  ASSESSMENT AND PLAN  1.  Accelerating Angina/USA:  No further chest pain.  CE negative.  Tentatively scheduled for cath today pending creatinine this AM (bmet pending).  Cont asa, statin, bb, heparin, & NTP.  2.  CKD IV:  BMET pending this AM.  Appreciate renal input.  He has  been receiving hydration pending cath.  3.  HTN:  BP stable.  Cont low dose bb and CCB.  Had been on hydralazine @ home also.  4.  HL:  LDL 75 - previously statin naive.  5.  R Leg Claudication:  bilat femoral bruits ->prob PVD.  Will need outpt ABI's.  6.  Tobacco Abuse:  Cessation advised.  Signed, Murray Hodgkins NP Patient seen and examined and history reviewed. Agree with above findings and plan.Creatinine decreased to 1.8 today. Will proceed with cardiac cath. The procedure and risks were reviewed including but not limited to death, myocardial infarction, stroke, arrythmias, bleeding, transfusion, emergency surgery, dye allergy, or renal dysfunction. The patient voices understanding and is agreeable to proceed. Luana Shu 02/09/2012 10:23 AM

## 2012-02-09 NOTE — H&P (View-Only) (Signed)
Patient Name: Kevin [name] Date of Encounter: 02/09/2012   Principal Problem:  *Accelerating angina Active Problems:  CKD (chronic kidney disease) stage 4, GFR 15-29 ml/min  Tobacco abuse  Essential hypertension, benign  Hyperlipidemia  Peripheral arterial disease  Anemia of chronic disease   SUBJECTIVE  No chest pain or sob overnight.  BMET pending this AM.  Tentatively scheduled for cath today.  CURRENT MEDS    . amLODipine  10 mg Oral Daily  . aspirin EC  81 mg Oral Daily  . atorvastatin  80 mg Oral q1800  . darbepoetin (ARANESP) injection - NON-DIALYSIS  60 mcg Subcutaneous Q Tue-1800  . ferumoxytol  1,020 mg Intravenous Once  . metoprolol tartrate  25 mg Oral BID  . nitroGLYCERIN  0.5 inch Topical Q6H  . sodium bicarbonate  650 mg Oral BID  . sodium chloride  3 mL Intravenous Q12H   OBJECTIVE  Filed Vitals:   02/08/12 1749 02/08/12 2100 02/08/12 2135 02/09/12 0500  BP: 160/67 130/71 130/71 136/65  Pulse: 62 62 60 55  Temp:  98.4 F (36.9 C)  98.6 F (37 C)  TempSrc:      Resp:  18  16  Height:      Weight:      SpO2:  98%  99%    Intake/Output Summary (Last 24 hours) at 02/09/12 0920 Last data filed at 02/09/12 0700  Gross per 24 hour  Intake      3 ml  Output   1100 ml  Net  -1097 ml   Filed Weights   02/06/12 1543 02/08/12 0541  Weight: 185 lb (83.915 kg) 184 lb 8 oz (83.689 kg)    PHYSICAL EXAM  General: Pleasant, NAD. Neuro: Alert and oriented X 3. Moves all extremities spontaneously. Psych: Normal affect. HEENT:  Normal  Neck: Supple without bruits or JVD. Lungs:  Resp regular and unlabored, CTA. Heart: RRR no s3, s4, or murmurs. Abdomen: Soft, non-tender, non-distended, BS + x 4.  Extremities: No clubbing, cyanosis or edema. DP/PT/Radials 1+ and equal bilaterally.  Bilat femoral bruits.  Accessory Clinical Findings  CBC  Basename 02/09/12 0805 02/08/12 0249  WBC 7.4 9.3  NEUTROABS -- --  HGB 8.4* 8.2*  HCT 26.3* 25.5*  MCV  93.6 93.4  PLT 149* 123XX123*   Basic Metabolic Panel  Basename [id number] 0249 02/07/12 0730  NA 135 134*  K 5.1 4.9  CL 107 104  CO2 16* 18*  GLUCOSE 142* 92  BUN 45* 44*  CREATININE 2.21* 2.54*  CALCIUM 8.2* 8.2*  MG -- --  PHOS -- --   Liver Function Tests  Basename 02/07/12 0730  AST 42*  ALT 58*  ALKPHOS 120*  BILITOT 0.3  PROT 6.0  ALBUMIN 2.9*   Cardiac Enzymes  Basename 02/08/12 0250 02/07/12 2030 02/07/12 0630  CKTOTAL -- -- --  CKMB -- -- --  CKMBINDEX -- -- --  TROPONINI <0.30 <0.30 <0.30   Fasting Lipid Panel  Basename 02/07/12 0630  CHOL 132  HDL 35*  LDLCALC 75  TRIG 108  CHOLHDL 3.8  LDLDIRECT --   TELE  Sb, pac's - 50's.  Radiology/Studies  No results found.  ASSESSMENT AND PLAN  1.  Accelerating Angina/USA:  No further chest pain.  CE negative.  Tentatively scheduled for cath today pending creatinine this AM (bmet pending).  Cont asa, statin, bb, heparin, & NTP.  2.  CKD IV:  BMET pending this AM.  Appreciate renal input.  He has  been receiving hydration pending cath.  3.  HTN:  BP stable.  Cont low dose bb and CCB.  Had been on hydralazine @ home also.  4.  HL:  LDL 75 - previously statin naive.  5.  R Leg Claudication:  bilat femoral bruits ->prob PVD.  Will need outpt ABI's.  6.  Tobacco Abuse:  Cessation advised.  Signed, Murray Hodgkins NP Patient seen and examined and history reviewed. Agree with above findings and plan.Creatinine decreased to 1.8 today. Will proceed with cardiac cath. The procedure and risks were reviewed including but not limited to death, myocardial infarction, stroke, arrythmias, bleeding, transfusion, emergency surgery, dye allergy, or renal dysfunction. The patient voices understanding and is agreeable to proceed. Luana Shu 02/09/2012 10:23 AM

## 2012-02-10 DIAGNOSIS — I517 Cardiomegaly: Secondary | ICD-10-CM

## 2012-02-10 LAB — BASIC METABOLIC PANEL
CO2: 21 mEq/L (ref 19–32)
Chloride: 108 mEq/L (ref 96–112)
Potassium: 5 mEq/L (ref 3.5–5.1)
Sodium: 139 mEq/L (ref 135–145)

## 2012-02-10 LAB — UIFE/LIGHT CHAINS/TP QN, 24-HR UR
Albumin, U: DETECTED
Alpha 1, Urine: DETECTED — AB
Beta, Urine: DETECTED — AB
Gamma Globulin, Urine: DETECTED — AB

## 2012-02-10 LAB — PROTEIN ELECTROPHORESIS, SERUM
Albumin ELP: 51 % — ABNORMAL LOW (ref 55.8–66.1)
Alpha-1-Globulin: 8 % — ABNORMAL HIGH (ref 2.9–4.9)
Alpha-2-Globulin: 15.2 % — ABNORMAL HIGH (ref 7.1–11.8)
Beta 2: 6.5 % (ref 3.2–6.5)
Beta Globulin: 6.9 % (ref 4.7–7.2)
Gamma Globulin: 12.4 % (ref 11.1–18.8)
M-Spike, %: NOT DETECTED g/dL
Total Protein ELP: 5.5 g/dL — ABNORMAL LOW (ref 6.0–8.3)

## 2012-02-10 LAB — CBC
Platelets: 167 10*3/uL (ref 150–400)
RBC: 3.07 MIL/uL — ABNORMAL LOW (ref 4.22–5.81)
WBC: 7.3 10*3/uL (ref 4.0–10.5)

## 2012-02-10 LAB — IMMUNOFIXATION ELECTROPHORESIS: IgG (Immunoglobin G), Serum: 744 mg/dL (ref 650–1600)

## 2012-02-10 LAB — HEPARIN LEVEL (UNFRACTIONATED): Heparin Unfractionated: 0.37 IU/mL (ref 0.30–0.70)

## 2012-02-10 MED ORDER — ISOSORBIDE MONONITRATE ER 30 MG PO TB24
30.0000 mg | ORAL_TABLET | Freq: Every day | ORAL | Status: DC
  Administered 2012-02-10 – 2012-02-12 (×3): 30 mg via ORAL
  Filled 2012-02-10 (×3): qty 1

## 2012-02-10 MED ORDER — SODIUM CHLORIDE 0.9 % IJ SOLN: 3.0000 mL | INTRAMUSCULAR | Status: DC | PRN

## 2012-02-10 MED ORDER — SODIUM CHLORIDE 0.9 % IV SOLN
1.0000 mL/kg/h | INTRAVENOUS | Status: DC
  Administered 2012-02-11: 1 mL/kg/h via INTRAVENOUS

## 2012-02-10 MED ORDER — DIAZEPAM 2 MG PO TABS
2.0000 mg | ORAL_TABLET | ORAL | Status: AC
  Administered 2012-02-11: 2 mg via ORAL
  Filled 2012-02-10: qty 1

## 2012-02-10 MED ORDER — SODIUM CHLORIDE 0.9 % IV SOLN: 250.0000 mL | INTRAVENOUS | Status: DC | PRN

## 2012-02-10 MED ORDER — SODIUM CHLORIDE 0.9 % IJ SOLN
3.0000 mL | Freq: Two times a day (BID) | INTRAMUSCULAR | Status: DC
  Administered 2012-02-10 – 2012-02-11 (×2): 3 mL via INTRAVENOUS

## 2012-02-10 MED ORDER — ASPIRIN 81 MG PO CHEW
324.0000 mg | CHEWABLE_TABLET | ORAL | Status: AC
  Administered 2012-02-11: 324 mg via ORAL
  Filled 2012-02-10: qty 4

## 2012-02-10 NOTE — Progress Notes (Signed)
ANTICOAGULATION CONSULT NOTE - Follow Up Consult  Pharmacy Consult for Heparin Indication: Multivessel CAD awaiting PCI  No Known Allergies  Patient Measurements: Height: 5\' 6"  (167.6 cm) Weight: 184 lb 8 oz (83.689 kg) IBW/kg (Calculated) : 63.8  Heparin Dosing Weight: 81kg  Vital Signs: Temp: 98.4 F (36.9 C) (01/14 0608) Temp src: Oral (01/14 0608) BP: 154/70 mmHg (01/14 0608) Pulse Rate: 54  (01/14 0608)  Labs:  Kevin [name] 02/10/12 0537 02/09/12 0805 02/08/12 0250 02/08/12 0249 02/07/12 2030  HGB 9.3* 8.4* -- -- --  HCT 28.6* 26.3* -- 25.5* --  PLT 167 149* -- 126* --  APTT -- -- -- -- --  LABPROT -- 14.4 -- -- --  INR -- 1.14 -- -- --  HEPARINUNFRC 0.37 0.95* -- 0.49 --  CREATININE 1.71* 1.81* -- 2.21* --  CKTOTAL -- -- -- -- --  CKMB -- -- -- -- --  TROPONINI -- -- <0.30 -- <0.30    Estimated Creatinine Clearance: 35.6 ml/min (by C-G formula based on Cr of 1.71).   Medications:  Heparin 1050 units/hr  Assessment: 81yom on heparin s/p cath (1/14) for multivessel CAD while awaiting PCI (scheduled for 1/15 AM). Heparin level (0.37) is therapeutic after restart last PM. Cath site is appropriate per documentation. - H/H and Plts improving - No significant bleeding reported  Goal of Therapy:  Heparin level 0.3-0.7 units/ml Monitor platelets by anticoagulation protocol: Yes   Plan:  1. Continue heparin 1050 units/hr (10.5 ml/hr) 2. Follow-up AM heparin level and post PCI plans  Kevin [name] [id number] 02/10/2012,8:53 AM

## 2012-02-10 NOTE — Progress Notes (Signed)
Patient ID: Kevin [name], male   DOB: February 18, 1930, 79 y.o.   MRN: [id number]   [facility] Progress Note    Subjective:   Reports an uneventful night following coronary angiography yesterday. No acute clinical changes.    Objective:   BP 154/70  Pulse 54  Temp 98.4 F (36.9 C) (Oral)  Resp 18  Ht 5\' 6"  (1.676 m)  Wt 83.689 kg (184 lb 8 oz)  BMI 29.78 kg/m2  SpO2 97%  Intake/Output Summary (Last 24 hours) at 02/10/12 0756 Last data filed at 02/10/12 0600  Gross per 24 hour  Intake 836.35 ml  Output   1600 ml  Net -763.65 ml   Weight change:   Physical Exam: Gen: Comfortably sitting up on the side of his bed, eating breakfast CVS: Pulse regular bradycardia, heart sounds S1 and S2 normal Resp: Clear to auscultation bilaterally, no rales/rhonchi Abd: Soft, obese, nontender and bowel sounds are normal Ext: Trace lower extremity edema  Imaging: US Renal  02/09/2012  *RADIOLOGY REPORT*  Clinical Data: Renal failure, chronic kidney disease, hypertension  RENAL/URINARY TRACT ULTRASOUND COMPLETE  Comparison:  Renal ultrasound - 01/04/2012  Findings:  Right Kidney:  Unchanged mild diffuse cortical thinning.  Normal cortical echogenicity and size, measuring 9.7 cm in length.  No focal renal lesions.  No echogenic renal stones.  No urinary obstruction.  Left Kidney:  Unchanged mild diffuse cortical thinning.  Normal cortical echogenicity and size, measuring 10.3 cm in length. Unchanged anechoic exophytic cyst arising from the mid aspect of the left kidney measuring approximately 5.1 x 4.1 x 3.7 cm. Previously noted adjacent sub-centimeter possible cyst within the inferior pole left kidney is not well appreciated on today's examination.  No echogenic renal stones.  No urinary obstruction.  Bladder:  Normal given degree of distension  IMPRESSION: 1.  Unchanged mild bilateral cortical thinning suggesting medical renal disease.  No evidence of urinary obstruction. 2.  Unchanged  exophytic approximately 5.1 left-sided renal cyst.   Original Report Authenticated By: Jake Seats, MD     Labs: BMET  Lab 02/10/12 0537 02/09/12 0805 02/08/12 0249 02/07/12 0730  NA 139 139 135 134*  K 5.0 5.2* 5.1 4.9  CL 108 109 107 104  CO2 21 20 16* 18*  GLUCOSE 92 97 142* 92  BUN 28* 35* 45* 44*  CREATININE 1.71* 1.81* 2.21* 2.54*  ALB -- -- -- --  CALCIUM 9.1 8.5 8.2* 8.2*  PHOS -- -- -- --   CBC  Lab 02/10/12 0537 02/09/12 0805 02/08/12 0249 02/07/12 0730  WBC 7.3 7.4 9.3 11.9*  NEUTROABS -- -- -- --  HGB 9.3* 8.4* 8.2* 9.1*  HCT 28.6* 26.3* 25.5* 27.9*  MCV 93.2 93.6 93.4 92.7  PLT 167 149* 126* 133*    Medications:      . amLODipine  10 mg Oral Daily  . aspirin EC  81 mg Oral Daily  . atorvastatin  80 mg Oral q1800  . clopidogrel  75 mg Oral Q breakfast  . darbepoetin (ARANESP) injection - NON-DIALYSIS  60 mcg Subcutaneous Q Tue-1800  . metoprolol tartrate  25 mg Oral BID  . nitroGLYCERIN  0.5 inch Topical Q6H  . sodium bicarbonate  650 mg Oral BID  . sodium chloride  3 mL Intravenous Q12H     Assessment/ Plan:   1. Chronic kidney disease stage IV with anticipated nephrotoxic exposure/contrast. Continues to have excellent urine output as well as renal function 24 hours following coronary angiography (50  cc iodinated contrast used). This was a staged procedure-he has a interventional procedure planned for tomorrow based on results. We'll continue efforts at fluid resuscitation/ minimization of contrast load to avoid nephropathy. According to the [facility] production model-he has a 25-30% chance of acute renal failure following contrast exposure and a 1-2% chance of needing dialysis.  2. Unstable angina/acute coronary syndrome: Cardiology notes reviewed, coronary angiography showed critical LAD stenosis. RCA appears chronically occluded and not amenable to intervention. Moderate distal LCX disease. Planned for PCI of LAD tomorrow 3. Anemia: Suspect anemia of  chronic kidney disease, we'll start ESA following the procedure. Suspect that his anemia may also be contributing to his angina.  4. Metabolic acidosis: Suspected this is chronic from underlying chronic kidney disease, start oral sodium bicarbonate therapy.    Elmarie Shiley, MD 02/10/2012, 7:56 AM

## 2012-02-10 NOTE — Progress Notes (Signed)
TELEMETRY: Reviewed telemetry pt in NSR: Filed Vitals:   02/09/12 1815 02/09/12 2042 02/09/12 2104 02/10/12 0608  BP: 119/77 157/67  154/70  Pulse: 57 53 65 54  Temp:  98.3 F (36.8 C)  98.4 F (36.9 C)  TempSrc:  Oral  Oral  Resp:  17  18  Height:      Weight:      SpO2:  96%  97%    Intake/Output Summary (Last 24 hours) at 02/10/12 0751 Last data filed at 02/10/12 0600  Gross per 24 hour  Intake 836.35 ml  Output   1600 ml  Net -763.65 ml    SUBJECTIVE Feels well this am. No chest pain or SOB. Urine output good.  LABS: Basic Metabolic Panel:  Basename 02/10/12 0537 02/09/12 0805  NA 139 139  K 5.0 5.2*  CL 108 109  CO2 21 20  GLUCOSE 92 97  BUN 28* 35*  CREATININE 1.71* 1.81*  CALCIUM 9.1 8.5  MG -- --  PHOS -- --   CBC:  Basename 02/10/12 0537 02/09/12 0805  WBC 7.3 7.4  NEUTROABS -- --  HGB 9.3* 8.4*  HCT 28.6* 26.3*  MCV 93.2 93.6  PLT 167 149*   Cardiac Enzymes:  Basename 02/08/12 0250 02/07/12 2030  CKTOTAL -- --  CKMB -- --  CKMBINDEX -- --  TROPONINI <0.30 <0.30   Radiology/Studies:  US Renal  02/09/2012  *RADIOLOGY REPORT*  Clinical Data: Renal failure, chronic kidney disease, hypertension  RENAL/URINARY TRACT ULTRASOUND COMPLETE  Comparison:  Renal ultrasound - 01/04/2012  Findings:  Right Kidney:  Unchanged mild diffuse cortical thinning.  Normal cortical echogenicity and size, measuring 9.7 cm in length.  No focal renal lesions.  No echogenic renal stones.  No urinary obstruction.  Left Kidney:  Unchanged mild diffuse cortical thinning.  Normal cortical echogenicity and size, measuring 10.3 cm in length. Unchanged anechoic exophytic cyst arising from the mid aspect of the left kidney measuring approximately 5.1 x 4.1 x 3.7 cm. Previously noted adjacent sub-centimeter possible cyst within the inferior pole left kidney is not well appreciated on today's examination.  No echogenic renal stones.  No urinary obstruction.  Bladder:  Normal given  degree of distension  IMPRESSION: 1.  Unchanged mild bilateral cortical thinning suggesting medical renal disease.  No evidence of urinary obstruction. 2.  Unchanged exophytic approximately 5.1 left-sided renal cyst.   Original Report Authenticated By: Jake Seats, MD     PHYSICAL EXAM General: Well developed, well nourished, in no acute distress. Head: Normal Neck: Negative for carotid bruits. JVD not elevated. Lungs: Clear bilaterally to auscultation without wheezes, rales, or rhonchi. Breathing is unlabored. Heart: RRR S1 S2 without murmurs, rubs, or gallops.  Abdomen: Soft, non-tender, non-distended with normoactive bowel sounds. No hepatomegaly. No rebound/guarding. No obvious abdominal masses. Extremities: No clubbing, cyanosis or edema.  Distal pedal pulses are 2+ and equal bilaterally. No groin hematoma. Neuro: Alert and oriented X 3. Moves all extremities spontaneously. Psych:  Responds to questions appropriately with a normal affect.  ASSESSMENT AND PLAN: 1. ACS with troponin .15 at [facility]. Cath demonstrates critical LAD stenosis. RCA chronically occluded. Moderate distal LCX disease. Plan PCI of LAD tomorrow. Will treat other disease medically. On beta blocker, nitrates, amlodipine. On ASA and plavix. Will update Echo here.  2. CKD stage IV- creatinine improved with hydration. Now down to 1.71. Will reassess in am and if stable proceed with PCI.   3. Anemia of chronic disease.  4.  Hyperlipidemia. On statin.  5. PAD with femoral bruits.   6. Tobacco abuse- counseled on smoking cessation.  8. HTN  Principal Problem:  *Accelerating angina Active Problems:  CKD (chronic kidney disease) stage 4, GFR 15-29 ml/min  Tobacco abuse  Essential hypertension, benign  Hyperlipidemia  Peripheral arterial disease  Anemia of chronic disease    Signed, Elorah Dewing [place] MD,FACC 02/10/2012 7:58 AM

## 2012-02-10 NOTE — Progress Notes (Signed)
  Echocardiogram 2D Echocardiogram has been performed.  Diamond Nickel 02/10/2012, 1:57 PM

## 2012-02-11 ENCOUNTER — Encounter (HOSPITAL_COMMUNITY)
Admission: AD | Disposition: A | Discharge status: Home / Self Care | Payer: Self-pay | Source: Other Acute Inpatient Hospital | Attending: Cardiology

## 2012-02-11 DIAGNOSIS — I2510 Atherosclerotic heart disease of native coronary artery without angina pectoris: Secondary | ICD-10-CM

## 2012-02-11 HISTORY — PX: PERCUTANEOUS CORONARY STENT INTERVENTION (PCI-S): SHX5485

## 2012-02-11 LAB — BASIC METABOLIC PANEL
Calcium: 8.9 mg/dL (ref 8.4–10.5)
GFR calc Af Amer: 49 mL/min — ABNORMAL LOW (ref >90)
GFR calc non Af Amer: 42 mL/min — ABNORMAL LOW (ref >90)
Glucose, Bld: 91 mg/dL (ref 70–99)
Potassium: 4.7 mEq/L (ref 3.5–5.1)
Sodium: 141 mEq/L (ref 135–145)

## 2012-02-11 LAB — CBC
HCT: 25.9 % — ABNORMAL LOW (ref 39.0–52.0)
MCHC: 33.2 g/dL (ref 30.0–36.0)
Platelets: 175 10*3/uL (ref 150–400)
RDW: 13.5 % (ref 11.5–15.5)
WBC: 9.1 10*3/uL (ref 4.0–10.5)

## 2012-02-11 LAB — HEPARIN LEVEL (UNFRACTIONATED): Heparin Unfractionated: 0.54 IU/mL (ref 0.30–0.70)

## 2012-02-11 SURGERY — PERCUTANEOUS CORONARY STENT INTERVENTION (PCI-S)
Anesthesia: LOCAL

## 2012-02-11 MED ORDER — HYDRALAZINE HCL 20 MG/ML IJ SOLN
10.0000 mg | Freq: Once | INTRAMUSCULAR | Status: AC
  Administered 2012-02-11: 15:00:00 10 mg via INTRAVENOUS
  Filled 2012-02-11: qty 0.5

## 2012-02-11 MED ORDER — VERAPAMIL HCL 2.5 MG/ML IV SOLN
INTRAVENOUS | Status: AC
  Filled 2012-02-11: qty 2

## 2012-02-11 MED ORDER — FENTANYL CITRATE 0.05 MG/ML IJ SOLN
INTRAMUSCULAR | Status: AC
  Filled 2012-02-11: qty 2

## 2012-02-11 MED ORDER — BIVALIRUDIN 250 MG IV SOLR
INTRAVENOUS | Status: AC
  Filled 2012-02-11: qty 250

## 2012-02-11 MED ORDER — LIDOCAINE HCL (PF) 1 % IJ SOLN
INTRAMUSCULAR | Status: AC
  Filled 2012-02-11: qty 30

## 2012-02-11 MED ORDER — SODIUM CHLORIDE 0.9 % IV SOLN
INTRAVENOUS | Status: AC
  Administered 2012-02-11: 13:00:00 via INTRAVENOUS

## 2012-02-11 MED ORDER — NITROGLYCERIN 0.2 MG/ML ON CALL CATH LAB
INTRAVENOUS | Status: AC
  Filled 2012-02-11: qty 1

## 2012-02-11 MED ORDER — HEPARIN (PORCINE) IN NACL 2-0.9 UNIT/ML-% IJ SOLN
INTRAMUSCULAR | Status: AC
  Filled 2012-02-11: qty 1000

## 2012-02-11 MED ORDER — MIDAZOLAM HCL 2 MG/2ML IJ SOLN
INTRAMUSCULAR | Status: AC
  Filled 2012-02-11: qty 2

## 2012-02-11 NOTE — Progress Notes (Signed)
TELEMETRY: Reviewed telemetry pt in NSR: Filed Vitals:   02/10/12 0958 02/10/12 1330 02/10/12 2143 02/11/12 0455  BP: 160/65 150/69 152/66 152/74  Pulse: 64 46 58 55  Temp:  98.9 F (37.2 C) 98.6 F (37 C) 99.1 F (37.3 C)  TempSrc:   Oral Oral  Resp:  18 18 18   Height:      Weight:      SpO2:  93% 99% 97%    Intake/Output Summary (Last 24 hours) at 02/11/12 0802 Last data filed at 02/11/12 0500  Gross per 24 hour  Intake    360 ml  Output   1300 ml  Net   -940 ml    SUBJECTIVE Feels well this am. No chest pain or SOB. Urine output good. Labs are just being drawn now.  LABS: Basic Metabolic Panel:  Basename 02/10/12 0537 02/09/12 0805  NA 139 139  K 5.0 5.2*  CL 108 109  CO2 21 20  GLUCOSE 92 97  BUN 28* 35*  CREATININE 1.71* 1.81*  CALCIUM 9.1 8.5  MG -- --  PHOS -- --   CBC:  Basename 02/10/12 0537 02/09/12 0805  WBC 7.3 7.4  NEUTROABS -- --  HGB 9.3* 8.4*  HCT 28.6* 26.3*  MCV 93.2 93.6  PLT 167 149*   Cardiac Enzymes: No results found for this basename: CKTOTAL:3,CKMB:3,CKMBINDEX:3,TROPONINI:3 in the last 72 hours Radiology/Studies:  US Renal  02/09/2012  *RADIOLOGY REPORT*  Clinical Data: Renal failure, chronic kidney disease, hypertension  RENAL/URINARY TRACT ULTRASOUND COMPLETE  Comparison:  Renal ultrasound - 01/04/2012  Findings:  Right Kidney:  Unchanged mild diffuse cortical thinning.  Normal cortical echogenicity and size, measuring 9.7 cm in length.  No focal renal lesions.  No echogenic renal stones.  No urinary obstruction.  Left Kidney:  Unchanged mild diffuse cortical thinning.  Normal cortical echogenicity and size, measuring 10.3 cm in length. Unchanged anechoic exophytic cyst arising from the mid aspect of the left kidney measuring approximately 5.1 x 4.1 x 3.7 cm. Previously noted adjacent sub-centimeter possible cyst within the inferior pole left kidney is not well appreciated on today's examination.  No echogenic renal stones.  No  urinary obstruction.  Bladder:  Normal given degree of distension  IMPRESSION: 1.  Unchanged mild bilateral cortical thinning suggesting medical renal disease.  No evidence of urinary obstruction. 2.  Unchanged exophytic approximately 5.1 left-sided renal cyst.   Original Report Authenticated By: Jake Seats, MD    Transthoracic Echocardiography  Patient: Kevin [name], Kevin [name] MR #: [id number] Study Date: 02/10/2012 Gender: M Age: 79 Height: 167.6cm Weight: 83.7kg BSA: 28m^2 Pt. Status: Room: 3W18C  ORDERING [place], [facility] SONOGRAPHER Diamond Nickel cc:  ------------------------------------------------------------ LV EF: 50% - 55%  ------------------------------------------------------------ Indications: Angina - unstable 411.1.  ------------------------------------------------------------ History: PMH: Chronic kidney disease. Peripheral arterial disease. Chronic anemia. Risk factors: Current tobacco use. Hypertension. Dyslipidemia.  ------------------------------------------------------------ Study Conclusions  - Left ventricle: The cavity size was normal. Wall thickness was normal. Systolic function was normal. The estimated ejection fraction was in the range of 50% to 55%. Wall motion was normal; there were no regional wall motion abnormalities. Doppler parameters are consistent with abnormal left ventricular relaxation (grade 1 diastolic dysfunction). - Left atrium: The atrium was mildly dilated. Transthoracic echocardiography. M-mode, complete 2D, spectral Doppler, and color Doppler. Height: Height: 167.6cm. Height: 66in. Weight: Weight: 83.7kg. Weight: 184.1lb. Body mass index: BMI: 29.8kg/m^2. Body surface area: BSA: 77m^2. Blood pressure: 160/65. Patient status: Inpatient. Location: Echo laboratory.  ------------------------------------------------------------  ------------------------------------------------------------  Left ventricle:  The cavity size was normal. Wall thickness was normal. Systolic function was normal. The estimated ejection fraction was in the range of 50% to 55%. Wall motion was normal; there were no regional wall motion abnormalities. Doppler parameters are consistent with abnormal left ventricular relaxation (grade 1 diastolic dysfunction).  ------------------------------------------------------------ Aortic valve: Trileaflet; mildly calcified leaflets. Mobility was not restricted. Doppler: Transvalvular velocity was within the normal range. There was no stenosis. No regurgitation.  ------------------------------------------------------------ Aorta: Aortic root: The aortic root was normal in size.  ------------------------------------------------------------ Mitral valve: Structurally normal valve. Mobility was not restricted. Doppler: Transvalvular velocity was within the normal range. There was no evidence for stenosis. No regurgitation. Peak gradient: 43mm Hg (D).  ------------------------------------------------------------ Left atrium: The atrium was mildly dilated.  ------------------------------------------------------------ Right ventricle: The cavity size was normal. Systolic function was normal.  ------------------------------------------------------------ Pulmonic valve: Doppler: Transvalvular velocity was within the normal range. There was no evidence for stenosis.  ------------------------------------------------------------ Tricuspid valve: Structurally normal valve. Doppler: Transvalvular velocity was within the normal range. Trivial regurgitation.  ------------------------------------------------------------ Right atrium: The atrium was normal in size.  ------------------------------------------------------------ Pericardium: There was no pericardial effusion.  ------------------------------------------------------------ Systemic veins: Inferior vena cava: The  vessel was normal in size.  ------------------------------------------------------------  2D measurements Normal Doppler measurements Normal Left ventricle Left ventricle LVID ED, 50.8 mm 43-52 Ea, lat ann, 11. cm/s ------ chord, tiss DP 2 PLAX E/Ea, lat 9.2 ------ LVID ES, 36.3 mm 23-38 ann, tiss DP chord, Ea, med ann, 11. cm/s ------ PLAX tiss DP 1 FS, chord, 29 % >29 E/Ea, med 9.2 ------ PLAX ann, tiss DP 8 LVPW, ED 10.3 mm ------ Mitral valve IVS/LVPW 1.04 <1.3 Peak E vel 103 cm/s ------ ratio, ED Peak A vel 97. cm/s ------ Ventricular septum 1 IVS, ED 10.7 mm ------ Deceleration 176 ms 150-23 Aorta time 0 Root diam, 33 mm ------ Peak 4 mm ------ ED gradient, D Hg Left atrium Peak E/A 1.1 ------ AP dim 45 mm ------ ratio AP dim 2.25 cm/m^2 <2.2 Right ventricle index Sa vel, lat 17. cm/s ------ ann, tiss DP 4  ------------------------------------------------------------ Prepared and Electronically Authenticated by  Kirk Ruths [id number]:[id number]  PHYSICAL EXAM General: Well developed, well nourished, in no acute distress. Head: Normal Neck: Negative for carotid bruits. JVD not elevated. Lungs: Clear bilaterally to auscultation without wheezes, rales, or rhonchi. Breathing is unlabored. Heart: RRR S1 S2 without murmurs, rubs, or gallops.  Abdomen: Soft, non-tender, non-distended with normoactive bowel sounds. No hepatomegaly. No rebound/guarding. No obvious abdominal masses. Extremities: trace edema.  Distal pedal pulses are 2+ and equal bilaterally. No groin hematoma. Neuro: Alert and oriented X 3. Moves all extremities spontaneously. Psych:  Responds to questions appropriately with a normal affect.  ASSESSMENT AND PLAN: 1. ACS with troponin .15 at [facility]. Cath demonstrates critical LAD stenosis. RCA chronically occluded. Moderate distal LCX disease. Plan PCI of LAD today. Will treat other disease medically. On beta blocker, nitrates, amlodipine.  On ASA and plavix. Echo shows normal LV function.  2. CKD stage IV- creatinine improved with hydration. BMET pending this am. Hydrated overnight. Urine output good.  3. Anemia of chronic disease.  4. Hyperlipidemia. On statin.  5. PAD with femoral bruits.   6. Tobacco abuse- counseled on smoking cessation.  8. HTN  Principal Problem:  *Accelerating angina Active Problems:  CKD (chronic kidney disease) stage 4, GFR 15-29 ml/min  Tobacco abuse  Essential hypertension, benign  Hyperlipidemia  Peripheral arterial disease  Anemia of chronic disease  Signed, Peter [place] MD,FACC 02/11/2012 8:02 AM

## 2012-02-11 NOTE — CV Procedure (Signed)
    Cardiac Catheterization Operative Report  Massey Wulf Moncrieffe [id number] 1/15/201411:37 AM No primary provider on file.  Procedure Performed:  1. PTCA/DES x 1 mid LAD  Operator: Lauree Chandler, MD  Arterial access site:  Right radial artery and right femoral artery.   Indication:  79 yo AAM with history of CAD admitted with unstable angina. Diagnostic cath 02/09/12 per Dr. Martin[place] with severe stenosis mid LAD. He was also found to have moderate disease in the Circumflex and a totally occluded RCA. Plans for PCI of LAD today.                                      Procedure Details: The risks, benefits, complications, treatment options, and expected outcomes were discussed with the patient. The patient and/or family concurred with the proposed plan, giving informed consent. The patient was brought to the cath lab after IV hydration was begun and oral premedication was given. The patient was further sedated with Versed. The right wrist was assessed with an Allens test which was positive. The right wrist was prepped and draped in a sterile fashion. 1% lidocaine was used for local anesthesia. Using the modified Seldinger access technique, a 6 French sheath was placed in the right radial artery. 3 mg Verapamil was given through the sheath. He was given a bolus of Angiomax and a drip was started. I then attempted to engage the left main with multiple different guiding catheters but was unable to get any of the catheters to engage. I then elected to gain access in the right femoral artery. 1% lidocaine was used for local anesthesia. A 6 French sheath was inserted into the right femoral artery. I then engaged the left main with a XB LAD 3.5 guiding catheter. A BMW wire was passed down the LAD beyond the stenosis. A 2.5 x 15 mm balloon was used to pre-dilate the stenosis. I then deployed a 3.0 x 28 mm Promus Premier drug eluting stent in the mid LAD. The stent was post-dilated with a 3.0 x 15 mm Fletcher balloon  distally and a 3.5 x 12 mm Clare balloon proximally. The stenosis was taken from 99% down to 0%. The sheath was removed from the right radial artery and a Terumo hemostasis band was applied at the arteriotomy site on the right wrist.    There were no immediate complications. The patient was taken to the recovery area in stable condition.   Hemodynamic Findings: Central aortic pressure: 156/54  Impression: 1. Triple vessel CAD presenting with unstable angina. 2. Succesful PTCA/DES x 1 mid LAD  Recommendations: He will need dual anti-platelet therapy with ASA and Plavix for at least one year. Will check P2Y12 in the am. Continue other cardiac meds.        Complications:  None. The patient tolerated the procedure well.

## 2012-02-11 NOTE — Progress Notes (Signed)
Site area: right groin  Site Prior to Removal:  Level 0  Pressure Applied For 20 MINUTES    Minutes Beginning at 1500  Manual:   yes  Patient Status During Pull:  stable  Post Pull Groin Site:  Level 0  Post Pull Instructions Given:  yes  Post Pull Pulses Present:  yes  Dressing Applied:  yes  Comments:  Pulled by Larene Beach RN -cath lab    TR BAND REMOVAL  LOCATION:  right radial  DEFLATED PER PROTOCOL:  yes  TIME BAND OFF / DRESSING APPLIED:   1530   SITE UPON ARRIVAL:   Level 0  SITE AFTER BAND REMOVAL:  Level 0  REVERSE ALLEN'S TEST:    positive  CIRCULATION SENSATION AND MOVEMENT:  Within Normal Limits  yes  COMMENTS:

## 2012-02-11 NOTE — Progress Notes (Signed)
Patient ID: Kevin [name], male   DOB: 01-14-1931, 79 y.o.   MRN: [id number]   [facility] Progress Note    Subjective:   Reports some transient chest discomfort today that was alleviated with belching. Denies any shortness of breath upon laying flat or ambulation.    Objective:   BP 152/74  Pulse 55  Temp 99.1 F (37.3 C) (Oral)  Resp 18  Ht 5\' 6"  (1.676 m)  Wt 83.689 kg (184 lb 8 oz)  BMI 29.78 kg/m2  SpO2 97%  Intake/Output Summary (Last 24 hours) at 02/11/12 0729 Last data filed at 02/11/12 0500  Gross per 24 hour  Intake    720 ml  Output   1300 ml  Net   -580 ml   Weight change:   Physical Exam: Gen: Comfortably sitting up on the side of his bed CVS: Pulse regular in rate and rhythm, heart sounds S1 and S2 appear normal Resp: Clear to auscultation bilaterally, no rales/rhonchi Abd: Soft, obese, nontender and bowel sounds are normal Ext: Trace to 1+ lower extremity edema-lower half of both shins  Imaging: US Renal  02/09/2012  *RADIOLOGY REPORT*  Clinical Data: Renal failure, chronic kidney disease, hypertension  RENAL/URINARY TRACT ULTRASOUND COMPLETE  Comparison:  Renal ultrasound - 01/04/2012  Findings:  Right Kidney:  Unchanged mild diffuse cortical thinning.  Normal cortical echogenicity and size, measuring 9.7 cm in length.  No focal renal lesions.  No echogenic renal stones.  No urinary obstruction.  Left Kidney:  Unchanged mild diffuse cortical thinning.  Normal cortical echogenicity and size, measuring 10.3 cm in length. Unchanged anechoic exophytic cyst arising from the mid aspect of the left kidney measuring approximately 5.1 x 4.1 x 3.7 cm. Previously noted adjacent sub-centimeter possible cyst within the inferior pole left kidney is not well appreciated on today's examination.  No echogenic renal stones.  No urinary obstruction.  Bladder:  Normal given degree of distension  IMPRESSION: 1.  Unchanged mild bilateral cortical thinning suggesting  medical renal disease.  No evidence of urinary obstruction. 2.  Unchanged exophytic approximately 5.1 left-sided renal cyst.   Original Report Authenticated By: Jake Seats, MD     Labs: BMET  Lab 02/10/12 0537 02/09/12 0805 02/08/12 0249 02/07/12 0730  NA 139 139 135 134*  K 5.0 5.2* 5.1 4.9  CL 108 109 107 104  CO2 21 20 16* 18*  GLUCOSE 92 97 142* 92  BUN 28* 35* 45* 44*  CREATININE 1.71* 1.81* 2.21* 2.54*  ALB -- -- -- --  CALCIUM 9.1 8.5 8.2* 8.2*  PHOS -- -- -- --   CBC  Lab 02/10/12 0537 02/09/12 0805 02/08/12 0249 02/07/12 0730  WBC 7.3 7.4 9.3 11.9*  NEUTROABS -- -- -- --  HGB 9.3* 8.4* 8.2* 9.1*  HCT 28.6* 26.3* 25.5* 27.9*  MCV 93.2 93.6 93.4 92.7  PLT 167 149* 126* 133*    Medications:      . amLODipine  10 mg Oral Daily  . aspirin EC  81 mg Oral Daily  . atorvastatin  80 mg Oral q1800  . clopidogrel  75 mg Oral Q breakfast  . darbepoetin (ARANESP) injection - NON-DIALYSIS  60 mcg Subcutaneous Q Tue-1800  . diazepam  2 mg Oral On Call  . isosorbide mononitrate  30 mg Oral Daily  . metoprolol tartrate  25 mg Oral BID  . sodium bicarbonate  650 mg Oral BID  . sodium chloride  3 mL Intravenous Q12H  . sodium  chloride  3 mL Intravenous Q12H     Assessment/ Plan:   1. Chronic kidney disease stage IV with anticipated nephrotoxic exposure/contrast. Continues to have excellent urine output, labs from this morning are pending. This was a staged procedure-he has a interventional procedure planned for today. Ongoing weight-based (1 cc per KG) normal saline at this time. According to the [facility] production model-he has a 25-30% chance of acute renal failure following contrast exposure and a 1-2% chance of needing dialysis.  2. Unstable angina/acute coronary syndrome: Cardiology notes reviewed, coronary angiography showed critical LAD stenosis. RCA appears chronically occluded and not amenable to intervention. Moderate distal LCX disease. Planned for PCI of LAD tomorrow    3. Anemia: Suspect anemia of chronic kidney disease, we'll start ESA following the procedure. Suspect that his anemia may also be contributing to his angina.  4. Metabolic acidosis: Suspected this is chronic from underlying chronic kidney disease, started on oral sodium bicarbonate therapy.    Elmarie Shiley, MD 02/11/2012, 7:29 AM

## 2012-02-11 NOTE — Interval H&P Note (Signed)
History and Physical Interval Note:  02/11/2012 11:22 AM  Kevin [name]  has presented today for cardiac cath/PCI with the diagnosis of CAD  The various methods of treatment have been discussed with the patient and family. After consideration of risks, benefits and other options for treatment, the patient has consented to  Procedure(s) (LRB) with comments: PERCUTANEOUS CORONARY STENT INTERVENTION (PCI-S) (N/A) as a surgical intervention .  The patient's history has been reviewed, patient examined, no change in status, stable for surgery.  I have reviewed the patient's chart and labs.  Questions were answered to the patient's satisfaction.     Daliya Parchment

## 2012-02-11 NOTE — Progress Notes (Signed)
ANTICOAGULATION CONSULT NOTE - Follow Up Consult  Pharmacy Consult for Heparin Indication: Multivessel CAD awaiting PCI  No Known Allergies  Patient Measurements: Height: 5\' 6"  (167.6 cm) Weight: 184 lb 8 oz (83.689 kg) IBW/kg (Calculated) : 63.8  Heparin Dosing Weight: 81kg  Vital Signs: Temp: 99.1 F (37.3 C) (01/15 0455) Temp src: Oral (01/15 0455) BP: 152/74 mmHg (01/15 0455) Pulse Rate: 55  (01/15 0455)  Labs:  Basename 02/11/12 0800 02/10/12 0537 02/09/12 0805  HGB 8.6* 9.3* --  HCT 25.9* 28.6* 26.3*  PLT 175 167 149*  APTT -- -- --  LABPROT -- -- 14.4  INR -- -- 1.14  HEPARINUNFRC 0.54 0.37 0.95*  CREATININE 1.51* 1.71* 1.81*  CKTOTAL -- -- --  CKMB -- -- --  TROPONINI -- -- --    Estimated Creatinine Clearance: 40.3 ml/min (by C-G formula based on Cr of 1.51).   Medications:  Heparin 1050 units/hr  Assessment: 81yom on heparin s/p cath (1/14) for multivessel CAD while awaiting PCI scheduled for this AM. Heparin level (0.54) remains therapeutic. Cath site is appropriate per documentation. - H/H trended down, Plts stable - No significant bleeding reported  Goal of Therapy:  Heparin level 0.3-0.7 units/ml Monitor platelets by anticoagulation protocol: Yes   Plan:  1. Continue heparin drip 1050 units/hr (10.5 ml/hr) 2. Follow-up post PCI orders  [name] [id number] 02/11/2012,10:07 AM

## 2012-02-11 NOTE — H&P (View-Only) (Signed)
TELEMETRY: Reviewed telemetry pt in NSR: Filed Vitals:   02/10/12 0958 02/10/12 1330 02/10/12 2143 02/11/12 0455  BP: 160/65 150/69 152/66 152/74  Pulse: 64 46 58 55  Temp:  98.9 F (37.2 C) 98.6 F (37 C) 99.1 F (37.3 C)  TempSrc:   Oral Oral  Resp:  18 18 18   Height:      Weight:      SpO2:  93% 99% 97%    Intake/Output Summary (Last 24 hours) at 02/11/12 0802 Last data filed at 02/11/12 0500  Gross per 24 hour  Intake    360 ml  Output   1300 ml  Net   -940 ml    SUBJECTIVE Feels well this am. No chest pain or SOB. Urine output good. Labs are just being drawn now.  LABS: Basic Metabolic Panel:  Basename 02/10/12 0537 02/09/12 0805  NA 139 139  K 5.0 5.2*  CL 108 109  CO2 21 20  GLUCOSE 92 97  BUN 28* 35*  CREATININE 1.71* 1.81*  CALCIUM 9.1 8.5  MG -- --  PHOS -- --   CBC:  Basename 02/10/12 0537 02/09/12 0805  WBC 7.3 7.4  NEUTROABS -- --  HGB 9.3* 8.4*  HCT 28.6* 26.3*  MCV 93.2 93.6  PLT 167 149*   Cardiac Enzymes: No results found for this basename: CKTOTAL:3,CKMB:3,CKMBINDEX:3,TROPONINI:3 in the last 72 hours Radiology/Studies:  US Renal  02/09/2012  *RADIOLOGY REPORT*  Clinical Data: Renal failure, chronic kidney disease, hypertension  RENAL/URINARY TRACT ULTRASOUND COMPLETE  Comparison:  Renal ultrasound - 01/04/2012  Findings:  Right Kidney:  Unchanged mild diffuse cortical thinning.  Normal cortical echogenicity and size, measuring 9.7 cm in length.  No focal renal lesions.  No echogenic renal stones.  No urinary obstruction.  Left Kidney:  Unchanged mild diffuse cortical thinning.  Normal cortical echogenicity and size, measuring 10.3 cm in length. Unchanged anechoic exophytic cyst arising from the mid aspect of the left kidney measuring approximately 5.1 x 4.1 x 3.7 cm. Previously noted adjacent sub-centimeter possible cyst within the inferior pole left kidney is not well appreciated on today's examination.  No echogenic renal stones.  No  urinary obstruction.  Bladder:  Normal given degree of distension  IMPRESSION: 1.  Unchanged mild bilateral cortical thinning suggesting medical renal disease.  No evidence of urinary obstruction. 2.  Unchanged exophytic approximately 5.1 left-sided renal cyst.   Original Report Authenticated By: Jake Seats, MD    Transthoracic Echocardiography  Patient: Jagr, Popp MR #: [id number] Study Date: 02/10/2012 Gender: M Age: 79 Height: 167.6cm Weight: 83.7kg BSA: 42m^2 Pt. Status: Room: 3W18C  ORDERING [place], [facility] SONOGRAPHER Diamond Nickel cc:  ------------------------------------------------------------ LV EF: 50% - 55%  ------------------------------------------------------------ Indications: Angina - unstable 411.1.  ------------------------------------------------------------ History: PMH: Chronic kidney disease. Peripheral arterial disease. Chronic anemia. Risk factors: Current tobacco use. Hypertension. Dyslipidemia.  ------------------------------------------------------------ Study Conclusions  - Left ventricle: The cavity size was normal. Wall thickness was normal. Systolic function was normal. The estimated ejection fraction was in the range of 50% to 55%. Wall motion was normal; there were no regional wall motion abnormalities. Doppler parameters are consistent with abnormal left ventricular relaxation (grade 1 diastolic dysfunction). - Left atrium: The atrium was mildly dilated. Transthoracic echocardiography. M-mode, complete 2D, spectral Doppler, and color Doppler. Height: Height: 167.6cm. Height: 66in. Weight: Weight: 83.7kg. Weight: 184.1lb. Body mass index: BMI: 29.8kg/m^2. Body surface area: BSA: 82m^2. Blood pressure: 160/65. Patient status: Inpatient. Location: Echo laboratory.  ------------------------------------------------------------  ------------------------------------------------------------  Left ventricle:  The cavity size was normal. Wall thickness was normal. Systolic function was normal. The estimated ejection fraction was in the range of 50% to 55%. Wall motion was normal; there were no regional wall motion abnormalities. Doppler parameters are consistent with abnormal left ventricular relaxation (grade 1 diastolic dysfunction).  ------------------------------------------------------------ Aortic valve: Trileaflet; mildly calcified leaflets. Mobility was not restricted. Doppler: Transvalvular velocity was within the normal range. There was no stenosis. No regurgitation.  ------------------------------------------------------------ Aorta: Aortic root: The aortic root was normal in size.  ------------------------------------------------------------ Mitral valve: Structurally normal valve. Mobility was not restricted. Doppler: Transvalvular velocity was within the normal range. There was no evidence for stenosis. No regurgitation. Peak gradient: 38mm Hg (D).  ------------------------------------------------------------ Left atrium: The atrium was mildly dilated.  ------------------------------------------------------------ Right ventricle: The cavity size was normal. Systolic function was normal.  ------------------------------------------------------------ Pulmonic valve: Doppler: Transvalvular velocity was within the normal range. There was no evidence for stenosis.  ------------------------------------------------------------ Tricuspid valve: Structurally normal valve. Doppler: Transvalvular velocity was within the normal range. Trivial regurgitation.  ------------------------------------------------------------ Right atrium: The atrium was normal in size.  ------------------------------------------------------------ Pericardium: There was no pericardial effusion.  ------------------------------------------------------------ Systemic veins: Inferior vena cava: The  vessel was normal in size.  ------------------------------------------------------------  2D measurements Normal Doppler measurements Normal Left ventricle Left ventricle LVID ED, 50.8 mm 43-52 Ea, lat ann, 11. cm/s ------ chord, tiss DP 2 PLAX E/Ea, lat 9.2 ------ LVID ES, 36.3 mm 23-38 ann, tiss DP chord, Ea, med ann, 11. cm/s ------ PLAX tiss DP 1 FS, chord, 29 % >29 E/Ea, med 9.2 ------ PLAX ann, tiss DP 8 LVPW, ED 10.3 mm ------ Mitral valve IVS/LVPW 1.04 <1.3 Peak E vel 103 cm/s ------ ratio, ED Peak A vel 97. cm/s ------ Ventricular septum 1 IVS, ED 10.7 mm ------ Deceleration 176 ms 150-23 Aorta time 0 Root diam, 33 mm ------ Peak 4 mm ------ ED gradient, D Hg Left atrium Peak E/A 1.1 ------ AP dim 45 mm ------ ratio AP dim 2.25 cm/m^2 <2.2 Right ventricle index Sa vel, lat 17. cm/s ------ ann, tiss DP 4  ------------------------------------------------------------ Prepared and Electronically Authenticated by  Kirk Ruths 2014-01-14T17:22:[id number]  PHYSICAL EXAM General: Well developed, well nourished, in no acute distress. Head: Normal Neck: Negative for carotid bruits. JVD not elevated. Lungs: Clear bilaterally to auscultation without wheezes, rales, or rhonchi. Breathing is unlabored. Heart: RRR S1 S2 without murmurs, rubs, or gallops.  Abdomen: Soft, non-tender, non-distended with normoactive bowel sounds. No hepatomegaly. No rebound/guarding. No obvious abdominal masses. Extremities: trace edema.  Distal pedal pulses are 2+ and equal bilaterally. No groin hematoma. Neuro: Alert and oriented X 3. Moves all extremities spontaneously. Psych:  Responds to questions appropriately with a normal affect.  ASSESSMENT AND PLAN: 1. ACS with troponin .15 at [facility] Cath demonstrates critical LAD stenosis. RCA chronically occluded. Moderate distal LCX disease. Plan PCI of LAD today. Will treat other disease medically. On beta blocker, nitrates, amlodipine.  On ASA and plavix. Echo shows normal LV function.  2. CKD stage IV- creatinine improved with hydration. BMET pending this am. Hydrated overnight. Urine output good.  3. Anemia of chronic disease.  4. Hyperlipidemia. On statin.  5. PAD with femoral bruits.   6. Tobacco abuse- counseled on smoking cessation.  8. HTN  Principal Problem:  *Accelerating angina Active Problems:  CKD (chronic kidney disease) stage 4, GFR 15-29 ml/min  Tobacco abuse  Essential hypertension, benign  Hyperlipidemia  Peripheral arterial disease  Anemia of chronic disease  Signed, Dade Rodin [place] MD,FACC 02/11/2012 8:02 AM

## 2012-02-11 NOTE — Care Management Note (Unsigned)
    Page 1 of 1   02/11/2012     12:55:39 PM   CARE MANAGEMENT NOTE 02/11/2012  Patient:  Kevin [name], Kevin [name]   Account Number:  [id number]  Date Initiated:  02/11/2012  Documentation initiated by:  GRAVES-BIGELOW,Vestal Crandall  Subjective/Objective Assessment:   Pt admitted with cp. S/p cath and revealed Triple Vessel CAD presenting with unstable angina.     Action/Plan:   CM will continue to monitor for disposition needs.   Anticipated DC Date:  02/13/2012   Anticipated DC Plan:  HOME/SELF CARE         Choice offered to / List presented to:             Status of service:  In process, will continue to follow Medicare Important Message given?   (If response is "NO", the following Medicare IM given date fields will be blank) Date Medicare IM given:   Date Additional Medicare IM given:    Discharge Disposition:    Per UR Regulation:  Reviewed for med. necessity/level of care/duration of stay  If discussed at [place] of Stay Meetings, dates discussed:    Comments:

## 2012-02-12 ENCOUNTER — Encounter (HOSPITAL_COMMUNITY): Payer: Self-pay | Admitting: Cardiology

## 2012-02-12 LAB — CBC
HCT: 26 % — ABNORMAL LOW (ref 39.0–52.0)
Hemoglobin: 8.5 g/dL — ABNORMAL LOW (ref 13.0–17.0)
MCH: 30.2 pg (ref 26.0–34.0)
RBC: 2.81 MIL/uL — ABNORMAL LOW (ref 4.22–5.81)

## 2012-02-12 LAB — BASIC METABOLIC PANEL
BUN: 17 mg/dL (ref 6–23)
CO2: 21 mEq/L (ref 19–32)
Calcium: 8.8 mg/dL (ref 8.4–10.5)
Glucose, Bld: 88 mg/dL (ref 70–99)
Potassium: 4.1 mEq/L (ref 3.5–5.1)
Sodium: 141 mEq/L (ref 135–145)

## 2012-02-12 MED ORDER — HYDRALAZINE HCL 25 MG PO TABS
25.0000 mg | ORAL_TABLET | Freq: Two times a day (BID) | ORAL | Status: DC
  Administered 2012-02-12: 25 mg via ORAL
  Filled 2012-02-12 (×2): qty 1

## 2012-02-12 MED ORDER — CLOPIDOGREL BISULFATE 75 MG PO TABS: 75.0000 mg | ORAL_TABLET | Freq: Every day | ORAL | Status: DC

## 2012-02-12 MED ORDER — ISOSORBIDE MONONITRATE ER 30 MG PO TB24: 30.0000 mg | ORAL_TABLET | Freq: Every day | ORAL | Status: DC

## 2012-02-12 MED ORDER — SODIUM BICARBONATE 650 MG PO TABS: 650.0000 mg | ORAL_TABLET | Freq: Two times a day (BID) | ORAL | Status: AC

## 2012-02-12 MED ORDER — NITROGLYCERIN 0.4 MG SL SUBL: 0.4000 mg | SUBLINGUAL_TABLET | SUBLINGUAL | Status: DC | PRN

## 2012-02-12 MED ORDER — METOPROLOL TARTRATE 25 MG PO TABS: 25.0000 mg | ORAL_TABLET | Freq: Two times a day (BID) | ORAL | Status: DC

## 2012-02-12 MED ORDER — ATORVASTATIN CALCIUM 80 MG PO TABS: 80.0000 mg | ORAL_TABLET | Freq: Every day | ORAL | Status: DC

## 2012-02-12 MED FILL — Dextrose Inj 5%: INTRAVENOUS | Qty: 50 | Status: AC

## 2012-02-12 NOTE — Progress Notes (Signed)
Utilization Review Completed Teaghan Melrose J. Travian Kerner, RN, BSN, NCM [phone]  

## 2012-02-12 NOTE — Progress Notes (Signed)
Patient ID: Kevin [name], male   DOB: Feb 12, 1930, 79 y.o.   MRN: [id number]   [facility] Progress Note    Subjective:   Reports to be feeling well - denies any chest pain   Objective:   BP 142/56  Pulse 64  Temp 98.6 F (37 C) (Oral)  Resp 21  Ht 5\' 6"  (1.676 m)  Wt 83.689 kg (184 lb 8 oz)  BMI 29.78 kg/m2  SpO2 97%  Intake/Output Summary (Last 24 hours) at 02/12/12 0826 Last data filed at 02/11/12 1800  Gross per 24 hour  Intake    250 ml  Output      0 ml  Net    250 ml   Weight change:   Physical Exam: [id number] resting on side of bed [id number] RRR, normal S1 and S2  Resp:CTA bilaterally, no rales/rhonchi [id number], obese, NT, BS normal [id number] LE edema  Imaging: No results found.  Labs: BMET  Lab 02/12/12 0555 02/11/12 0800 02/10/12 0537 02/09/12 0805 02/08/12 0249 02/07/12 0730  NA 141 141 139 139 135 134*  K 4.1 4.7 5.0 5.2* 5.1 4.9  CL 108 110 108 109 107 104  CO2 21 21 21 20  16* 18*  GLUCOSE 88 91 92 97 142* 92  BUN 17 21 28* 35* 45* 44*  CREATININE 1.46* 1.51* 1.71* 1.81* 2.21* 2.54*  ALB -- -- -- -- -- --  CALCIUM 8.8 8.9 9.1 8.5 8.2* 8.2*  PHOS -- -- -- -- -- --   CBC  Lab 02/12/12 0555 02/11/12 0800 02/10/12 0537 02/09/12 0805  WBC 9.4 9.1 7.3 7.4  NEUTROABS -- -- -- --  HGB 8.5* 8.6* 9.3* 8.4*  HCT 26.0* 25.9* 28.6* 26.3*  MCV 92.5 92.8 93.2 93.6  PLT 183 175 167 149*    Medications:      . amLODipine  10 mg Oral Daily  . aspirin EC  81 mg Oral Daily  . atorvastatin  80 mg Oral q1800  . clopidogrel  75 mg Oral Q breakfast  . darbepoetin (ARANESP) injection - NON-DIALYSIS  60 mcg Subcutaneous Q Tue-1800  . isosorbide mononitrate  30 mg Oral Daily  . metoprolol tartrate  25 mg Oral BID  . sodium bicarbonate  650 mg Oral BID  . sodium chloride  3 mL Intravenous Q12H     Assessment/ Plan:   1. Chronic kidney disease stage IV with anticipated nephrotoxic exposure/contrast. Stable renal function  following IVFs/ coronary PCI. Appears with good UOP and stable for DC for OP FU with Dr.Befekadu for his CKD.  2. Unstable angina/acute coronary syndrome: Cardiology notes reviewed, coronary angiography showed critical LAD stenosis. And now s/p successful PCI of LAD yesterday.  3. Anemia: Suspect anemia of chronic kidney disease, Given ESA dose 2 days ago after Fe. Suspect that his anemia may also be contributing to his angina.  4. Metabolic acidosis: Suspected this is chronic from underlying chronic kidney disease, started on oral sodium bicarbonate therapy.    Elmarie Shiley, MD 02/12/2012, 8:26 AM

## 2012-02-12 NOTE — Discharge Summary (Signed)
Discharge Summary   Patient ID: Kevin [name] MRN: [id number], DOB/AGE: 1930-04-23 79 y.o.  Primary MD:  Primary Cardiologist: Dr. Domenic Polite in [place] date: 02/06/2012 D/C date:     02/12/2012      Primary Discharge Diagnoses:  1. NSTEMI/CAD  - Cath 1/15 showed critical LAD stenosis, chronically occluded RCA, mod distal LCx dz s/p PTCA/DES to LAD   - Echo EF 50% to [id number], grade 1 diastolic dysfunction  2. Acute on Chronic Kidney Failure  - Improved with gentle hydration  - Crt 1.46 at discharge  - Follow with Dr. Lowanda Foster   3. Femoral bruits  - Suspect PAD; Recommend LE dopplers as outpatient  4. Dyslipidemia  - Initiated on statin, will need f/u LFTs/lipid panel in 6wks  5. Hypertension 6. Chronic Anemia 7. Tobacco Abuse   Allergies No Known Allergies  Diagnostic Studies/Procedures:   02/09/12 - Cardiac Cath Hemodynamics:  AO 153/60 with a mean of 96 mmHg  LV 152/18 mmHg  Coronary angiography:  Coronary dominance: right  Left mainstem: The left main coronary has mild irregularities less than 10%.  Left anterior descending (LAD): The LAD has a 70-80% stenosis proximally followed by a 90-95% stenosis at the bifurcation with the second diagonal branch. The first diagonal branch is without significant disease.  Left circumflex (LCx): The left circumflex is a large vessel. It has a 30% stenosis in the mid vessel. There is a 70% focal stenosis prior to the takeoff of the second marginal branch. The distal circumflex on the posterior lateral wall has a 90% stenosis.  Right coronary artery (RCA): The right coronary arises and distributes normally. Has a 90% stenosis in the mid vessel and is then occluded at the crux. There are left to right collaterals to the distal RCA.  Left ventriculography: Not performed  Final Conclusions:  1. Severe three-vessel obstructive coronary disease.  Recommendations: We will discuss revascularization options with the patient. These include  coronary bypass surgery versus percutaneous intervention of the LAD only.  02/11/12 - Cardiac Cath Central aortic pressure: 156/54  Impression:  1. Triple vessel CAD presenting with unstable angina.  2. Succesful PTCA/DES x 1 mid LAD  Recommendations: He will need dual anti-platelet therapy with ASA and Plavix for at least one year. Will check P2Y12 in the am. Continue other cardiac meds  02/09/2012  - RENAL/URINARY TRACT ULTRASOUND COMPLETE Findings:  Right Kidney:  Unchanged mild diffuse cortical thinning.  Normal cortical echogenicity and size, measuring 9.7 cm in length.  No focal renal lesions.  No echogenic renal stones.  No urinary obstruction.  Left Kidney:  Unchanged mild diffuse cortical thinning.  Normal cortical echogenicity and size, measuring 10.3 cm in length. Unchanged anechoic exophytic cyst arising from the mid aspect of the left kidney measuring approximately 5.1 x 4.1 x 3.7 cm. Previously noted adjacent sub-centimeter possible cyst within the inferior pole left kidney is not well appreciated on today's examination.  No echogenic renal stones.  No urinary obstruction.  Bladder:  Normal given degree of distension  IMPRESSION: 1.  Unchanged mild bilateral cortical thinning suggesting medical renal disease.  No evidence of urinary obstruction. 2.  Unchanged exophytic approximately 5.1 left-sided renal cyst.      02/10/12 - Echo Study Conclusions: - Left ventricle: The cavity size was normal. Wall thickness was normal. Systolic function was normal. The estimated ejection fraction was in the range of 50% to 55%. Wall motion was normal; there were no regional wall motion abnormalities. Doppler parameters are  consistent with abnormal left ventricular relaxation (grade 1 diastolic dysfunction). - Left atrium: The atrium was mildly dilated.    History of Present Illness: 79 y.o. male w/ the above medical problems who transferred from [facility] to [facility] on 02/06/12 for  NSTEMI.  Hospital Course: EKG revealed NSR with nonspecific ST changes. CXR was without acute cardiopulmonary abnormalities. Labs were significant for troponin 0.04-->0.15, elevated DDimer, pBNP 160, elevated LFTs, Crt 1.9. VQ scan was performed and was low probability for PE. He was transferred to [facility] for further evaluation and treatment. He was gently hydrated in preparation for cardiac cath. He was evaluated by Nephrology given his h/o CKD and risk of contrast induced nephropathy with cardiac cath. He was started on ESA for anemia of chronic kidney disease as well as oral sodium bicarbonate for metabolic acidosis from underlying CKD. Diagnostic cardiac cath was performed 02/09/12 with limited dye load and revealed severe 3v obstructive cad. After further review of the cath films and discussion with the patient and his family it was felt his best option was to stent the LAD and treat the other disease medically. He was loaded with Plavix and gently hydrated. He underwent successful PTCA/stenting of the mid LAD with DES on 02/11/11. He tolerated the procedure without complications. Recommendations were made for DAPT for at least one year as well as continuation of other cardiac meds and aggressive risk factor reduction. His renal function remained stable. He was initiated on a statin and will therefore need follow up lipid panel and LFTs in 6 weeks.  He was seen and evaluated by Dr. Martinique who felt he was stable for discharge home with plans for follow up as scheduled below. He will continue to follow up with Dr.Befekadu for his CKD. He was noted to have femoral bruits on physical exam. It is recommended he have lower extremity dopplers as an outpatient.  Discharge Vitals: Blood pressure 132/43, pulse 79, temperature 98.4 F (36.9 C), temperature source Oral, resp. rate 22, height 5\' 6"  (1.676 m), weight 184 lb 8 oz (83.689 kg), SpO2 97.00%.  Labs: Component Value Date   WBC 9.4 02/12/2012   HGB  8.5* 02/12/2012   HCT 26.0* 02/12/2012   MCV 92.5 02/12/2012   PLT 183 02/12/2012     02/08/2012 09:05  Iron 17 (L)  UIBC 228  TIBC 245  Saturation Ratios 7 (L)   Lab 02/12/12 0555 02/07/12 0730  NA 141 --  K 4.1 --  CL 108 --  CO2 21 --  BUN 17 --  CREATININE 1.46* --  CALCIUM 8.8 --  PROT -- 6.0  BILITOT -- 0.3  ALKPHOS -- 120*  ALT -- 58*  AST -- 42*  GLUCOSE 88 --   Component Value Date   CHOL 132 02/07/2012   HDL 35* 02/07/2012   LDLCALC 75 02/07/2012   TRIG 108 02/07/2012     02/07/2012 06:30 02/07/2012 20:30 02/08/2012 02:50  Troponin I <0.30 <0.30 <0.30    02/08/2012 09:05  PTH 134.5 (H)    Discharge Medications     Medication List     As of 02/12/2012 11:24 AM    STOP taking these medications         metoprolol succinate 25 MG 24 hr tablet   Commonly known as: TOPROL-XL      TAKE these medications         amLODipine 10 MG tablet   Commonly known as: NORVASC   Take 10 mg by  mouth daily.      aspirin EC 81 MG tablet   Take 81 mg by mouth daily.      atorvastatin 80 MG tablet   Commonly known as: LIPITOR   Take 1 tablet (80 mg total) by mouth at bedtime.      calcium-vitamin D 500-200 MG-UNIT per tablet   Commonly known as: OSCAL WITH D   Take 1 tablet by mouth 3 (three) times daily.      clopidogrel 75 MG tablet   Commonly known as: PLAVIX   Take 1 tablet (75 mg total) by mouth daily with breakfast.      hydrALAZINE 25 MG tablet   Commonly known as: APRESOLINE   Take 25 mg by mouth 2 (two) times daily.      isosorbide mononitrate 30 MG 24 hr tablet   Commonly known as: IMDUR   Take 1 tablet (30 mg total) by mouth daily.      metoprolol tartrate 25 MG tablet   Commonly known as: LOPRESSOR   Take 1 tablet (25 mg total) by mouth 2 (two) times daily.      nitroGLYCERIN 0.4 MG SL tablet   Commonly known as: NITROSTAT   Place 1 tablet (0.4 mg total) under the tongue every 5 (five) minutes as needed for chest pain (up to 3 doses).      sodium  bicarbonate 650 MG tablet   Take 1 tablet (650 mg total) by mouth 2 (two) times daily.         Disposition   Discharge Orders    Future Appointments: Provider: Department: Dept Phone: Center:   02/25/2012 1:00 PM Aurora Mask, PA [address] (near [place]) [phone] [facility]     Future Orders Please Complete By Expires   Amb Referral to Cardiac Rehabilitation      Comments:   Pt agrees to Outpt CRP in [place] [address], will send referral.   Diet - low sodium heart healthy      Increase activity slowly      Discharge instructions      Comments:   * Please take all medications as prescribed and bring them with you to your office visits.  * Please STOP smoking!  * You were started on a cholesterol medication (Lipitor) this hospitalization and will need follow up blood work (lipid panel and liver function tests) in 6-8wks  * KEEP WRIST and GROIN SITE CLEAN AND DRY. Call the office for any signs of bleedings, pus, swelling, increased pain, or any other concerns. * NO HEAVY LIFTING (>10lbs) X 2 WEEKS. * NO SEXUAL ACTIVITY X 2 WEEKS. * NO DRIVING X 1 WEEK. * NO SOAKING BATHS, HOT TUBS, POOLS, ETC., X 7 DAYS.  * It is recommended you have an ultrasound of your legs to determine if you have blockages in the vessels in your legs.     Follow-up Information    Follow up with SERPE, EUGENE, PA. On 02/25/2012. (1:00)    Contact information:   [facility] [address] [place] [address] [phone]           Outstanding Labs/Studies:  1. BLE ABIs 2. follow up lipid panel and LFTs in 6 weeks  Duration of Discharge Encounter: Greater than 30 minutes including physician and PA time.  Signed, Aristotle Lieb PA-C 02/12/2012, 11:24 AM

## 2012-02-12 NOTE — Progress Notes (Signed)
TELEMETRY: Reviewed telemetry pt in NSR: Filed Vitals:   02/12/12 0300 02/12/12 0400 02/12/12 0500 02/12/12 0601  BP: 160/60 162/56 140/68 142/56  Pulse:    64  Temp:    98.6 F (37 C)  TempSrc:    Oral  Resp: 21 21 19 21   Height:      Weight:      SpO2:  97%  97%    Intake/Output Summary (Last 24 hours) at 02/12/12 0825 Last data filed at 02/11/12 1800  Gross per 24 hour  Intake    250 ml  Output      0 ml  Net    250 ml    SUBJECTIVE Feels well this am. No chest pain or SOB. Urine output good. No groin complaints.  LABS: Basic Metabolic Panel:  Basename 02/12/12 0555 02/11/12 0800  NA 141 141  K 4.1 4.7  CL 108 110  CO2 21 21  GLUCOSE 88 91  BUN 17 21  CREATININE 1.46* 1.51*  CALCIUM 8.8 8.9  MG -- --  PHOS -- --   CBC:  Basename 02/12/12 0555 02/11/12 0800  WBC 9.4 9.1  NEUTROABS -- --  HGB 8.5* 8.6*  HCT 26.0* 25.9*  MCV 92.5 92.8  PLT 183 175   Cardiac Enzymes: No results found for this basename: CKTOTAL:3,CKMB:3,CKMBINDEX:3,TROPONINI:3 in the last 72 hours Radiology/Studies:  US Renal  02/09/2012  *RADIOLOGY REPORT*  Clinical Data: Renal failure, chronic kidney disease, hypertension  RENAL/URINARY TRACT ULTRASOUND COMPLETE  Comparison:  Renal ultrasound - 01/04/2012  Findings:  Right Kidney:  Unchanged mild diffuse cortical thinning.  Normal cortical echogenicity and size, measuring 9.7 cm in length.  No focal renal lesions.  No echogenic renal stones.  No urinary obstruction.  Left Kidney:  Unchanged mild diffuse cortical thinning.  Normal cortical echogenicity and size, measuring 10.3 cm in length. Unchanged anechoic exophytic cyst arising from the mid aspect of the left kidney measuring approximately 5.1 x 4.1 x 3.7 cm. Previously noted adjacent sub-centimeter possible cyst within the inferior pole left kidney is not well appreciated on today's examination.  No echogenic renal stones.  No urinary obstruction.  Bladder:  Normal given degree of  distension  IMPRESSION: 1.  Unchanged mild bilateral cortical thinning suggesting medical renal disease.  No evidence of urinary obstruction. 2.  Unchanged exophytic approximately 5.1 left-sided renal cyst.   Original Report Authenticated By: Jake Seats, MD    Transthoracic Echocardiography  Patient: Kevin [name], Kevin [name] MR #: [id number] Study Date: 02/10/2012 Gender: M Age: 79 Height: 167.6cm Weight: 83.7kg BSA: 36m^2 Pt. Status: Room: 3W18C  ORDERING [place]ique, [facility] SONOGRAPHER Diamond Nickel cc:  ------------------------------------------------------------ LV EF: 50% - 55%  ------------------------------------------------------------ Indications: Angina - unstable 411.1.  ------------------------------------------------------------ History: PMH: Chronic kidney disease. Peripheral arterial disease. Chronic anemia. Risk factors: Current tobacco use. Hypertension. Dyslipidemia.  ------------------------------------------------------------ Study Conclusions  - Left ventricle: The cavity size was normal. Wall thickness was normal. Systolic function was normal. The estimated ejection fraction was in the range of 50% to 55%. Wall motion was normal; there were no regional wall motion abnormalities. Doppler parameters are consistent with abnormal left ventricular relaxation (grade 1 diastolic dysfunction). - Left atrium: The atrium was mildly dilated. Transthoracic echocardiography. M-mode, complete 2D, spectral Doppler, and color Doppler. Height: Height: 167.6cm. Height: 66in. Weight: Weight: 83.7kg. Weight: 184.1lb. Body mass index: BMI: 29.8kg/m^2. Body surface area: BSA: 86m^2. Blood pressure: 160/65. Patient status: Inpatient. Location: Echo laboratory.  ------------------------------------------------------------  ------------------------------------------------------------ Left ventricle:  The cavity size was normal. Wall thickness was normal.  Systolic function was normal. The estimated ejection fraction was in the range of 50% to 55%. Wall motion was normal; there were no regional wall motion abnormalities. Doppler parameters are consistent with abnormal left ventricular relaxation (grade 1 diastolic dysfunction).  ------------------------------------------------------------ Aortic valve: Trileaflet; mildly calcified leaflets. Mobility was not restricted. Doppler: Transvalvular velocity was within the normal range. There was no stenosis. No regurgitation.  ------------------------------------------------------------ Aorta: Aortic root: The aortic root was normal in size.  ------------------------------------------------------------ Mitral valve: Structurally normal valve. Mobility was not restricted. Doppler: Transvalvular velocity was within the normal range. There was no evidence for stenosis. No regurgitation. Peak gradient: 2mm Hg (D).  ------------------------------------------------------------ Left atrium: The atrium was mildly dilated.  ------------------------------------------------------------ Right ventricle: The cavity size was normal. Systolic function was normal.  ------------------------------------------------------------ Pulmonic valve: Doppler: Transvalvular velocity was within the normal range. There was no evidence for stenosis.  ------------------------------------------------------------ Tricuspid valve: Structurally normal valve. Doppler: Transvalvular velocity was within the normal range. Trivial regurgitation.  ------------------------------------------------------------ Right atrium: The atrium was normal in size.  ------------------------------------------------------------ Pericardium: There was no pericardial effusion.  ------------------------------------------------------------ Systemic veins: Inferior vena cava: The vessel was normal in  size.  ------------------------------------------------------------  2D measurements Normal Doppler measurements Normal Left ventricle Left ventricle LVID ED, 50.8 mm 43-52 Ea, lat ann, 11. cm/s ------ chord, tiss DP 2 PLAX E/Ea, lat 9.2 ------ LVID ES, 36.3 mm 23-38 ann, tiss DP chord, Ea, med ann, 11. cm/s ------ PLAX tiss DP 1 FS, chord, 29 % >29 E/Ea, med 9.2 ------ PLAX ann, tiss DP 8 LVPW, ED 10.3 mm ------ Mitral valve IVS/LVPW 1.04 <1.3 Peak E vel 103 cm/s ------ ratio, ED Peak A vel 97. cm/s ------ Ventricular septum 1 IVS, ED 10.7 mm ------ Deceleration 176 ms 150-23 Aorta time 0 Root diam, 33 mm ------ Peak 4 mm ------ ED gradient, D Hg Left atrium Peak E/A 1.1 ------ AP dim 45 mm ------ ratio AP dim 2.25 cm/m^2 <2.2 Right ventricle index Sa vel, lat 17. cm/s ------ ann, tiss DP 4  ------------------------------------------------------------ Prepared and Electronically Authenticated by  Kirk Ruths [id number]  PHYSICAL EXAM General: Well developed, well nourished, in no acute distress. Head: Normal Neck: Negative for carotid bruits. JVD not elevated. Lungs: Clear bilaterally to auscultation without wheezes, rales, or rhonchi. Breathing is unlabored. Heart: RRR S1 S2 without murmurs, rubs, or gallops.  Abdomen: Soft, non-tender, non-distended with normoactive bowel sounds. No hepatomegaly. No rebound/guarding. No obvious abdominal masses. Extremities: trace edema.  Distal pedal pulses are 2+ and equal bilaterally. No groin hematoma. Neuro: Alert and oriented X 3. Moves all extremities spontaneously. Psych:  Responds to questions appropriately with a normal affect.  ASSESSMENT AND PLAN: 1. ACS with troponin .15 at [facility]. Cath demonstrates critical LAD stenosis. RCA chronically occluded. Moderate distal LCX disease. S/p PCI of LAD with DES yesterday. No complications. Will treat other disease medically. On beta blocker, nitrates,  amlodipine. On ASA and plavix. Echo shows normal LV function. Stable for discharge today. Follow up with Dr. Domenic Polite in [place]. Recommend phase 2 cardiac Rehab.  2. CKD stage IV- creatinine improved with hydration. Creatinine down to 1.4 today. Hydrated overnight. Urine output good. Will follow up with Dr. Burnett Sheng as outpatient.  3. Anemia of chronic disease. Received aranesp yesterday.  4. Hyperlipidemia. On statin.  5. PAD with femoral bruits. Would get lower extremity dopplers as outpatient.  6. Tobacco abuse- counseled on smoking cessation.  8. HTN still not  optimal on amlodipine and metoprolol. Was on hydralazine 25 mg bid PTA- will resume.  Principal Problem:  *Accelerating angina Active Problems:  CKD (chronic kidney disease) stage 4, GFR 15-29 ml/min  Tobacco abuse  Essential hypertension, benign  Hyperlipidemia  Peripheral arterial disease  Anemia of chronic disease    Signed, Peter Martinique MD,FACC 02/12/2012 8:25 AM

## 2012-02-12 NOTE — Progress Notes (Signed)
CARDIAC REHAB PHASE I   PRE:  Rate/Rhythm: 71 SR  BP:  Supine:   Sitting: 136/55  Standing:    SaO2:   MODE:  Ambulation: 540 ft   POST:  Rate/Rhythem: 79 SR  BP:  Supine:   Sitting: 152/55  Standing:    SaO2:  0750-0900 Assisted X 1 to ambulate. Gait steady. VS stable Pt only c/o is of hip pain by end of walk, states that he hs this with walking. No c/o of cp or SOB. Pt to side of bed after walk with call light in reach.Completed stent education with pt He voices understanding. Pt agrees to [place]. CRP in [place], will send referral.  Deon Pilling

## 2012-02-12 NOTE — Discharge Summary (Signed)
Patient seen and examined and history reviewed. Agree with above findings and plan. See my earlier rounding note.  Kevin [name] 02/12/2012 12:37 PM

## 2012-02-16 ENCOUNTER — Telehealth: Payer: Self-pay

## 2012-02-16 NOTE — Telephone Encounter (Signed)
**Note De-Identified Kevin [name] Obfuscation** Pt. states that he is doing well since hosp d/c on 02/06/12 and that he is without any cardiac complaints at this time. He states he has all of his medications and he is aware of eph f/u with Gene Serpe, PA-C on 02/25/12.

## 2012-02-25 ENCOUNTER — Encounter: Payer: Self-pay | Admitting: Physician Assistant

## 2012-02-25 ENCOUNTER — Ambulatory Visit (INDEPENDENT_AMBULATORY_CARE_PROVIDER_SITE_OTHER): Payer: Medicare Other | Admitting: Physician Assistant

## 2012-02-25 VITALS — BP 131/68 | HR 60 | Ht 66.0 in | Wt 184.0 lb

## 2012-02-25 DIAGNOSIS — I10 Essential (primary) hypertension: Secondary | ICD-10-CM

## 2012-02-25 DIAGNOSIS — I2510 Atherosclerotic heart disease of native coronary artery without angina pectoris: Secondary | ICD-10-CM

## 2012-02-25 DIAGNOSIS — E785 Hyperlipidemia, unspecified: Secondary | ICD-10-CM

## 2012-02-25 DIAGNOSIS — Z79899 Other long term (current) drug therapy: Secondary | ICD-10-CM

## 2012-02-25 DIAGNOSIS — R0989 Other specified symptoms and signs involving the circulatory and respiratory systems: Secondary | ICD-10-CM

## 2012-02-25 DIAGNOSIS — F17200 Nicotine dependence, unspecified, uncomplicated: Secondary | ICD-10-CM

## 2012-02-25 DIAGNOSIS — E782 Mixed hyperlipidemia: Secondary | ICD-10-CM

## 2012-02-25 DIAGNOSIS — N184 Chronic kidney disease, stage 4 (severe): Secondary | ICD-10-CM

## 2012-02-25 DIAGNOSIS — Z720 Tobacco use: Secondary | ICD-10-CM

## 2012-02-25 NOTE — Assessment & Plan Note (Signed)
Patient has since stopped smoking

## 2012-02-25 NOTE — Assessment & Plan Note (Signed)
Continue current medication regimen, including DAPT with ASA/Plavix for at least one year. Will schedule return visit in 3 months, with Dr. Domenic Polite. Will refer patient for cardiac rehabilitation.

## 2012-02-25 NOTE — Assessment & Plan Note (Signed)
Followed by Dr. Lowanda Foster

## 2012-02-25 NOTE — Progress Notes (Signed)
Primary Cardiologist: Johnny Bridge, MD   HPI: Post hospital followup from [facility], status post transfer from [facility] with NSTEMI (troponin 0.15). Patient presented with no known history of CAD.   - Cardiac catheterization on January 15: 3v CAD with critical LAD; chronic 100% RCA; and, moderate distal CFX disease (LVG deferred)   - 2-D echocardiogram: EF 50-50; no foca lWMAs; grade 1 diastolic dysfunction  Patient underwent successful PCI with DES of the LAD.    Clinically, he has not had any recurrent symptoms. He is tolerating his medications, and notes no complications of the catheterization incision sites. He has resumed followup with Dr. Lowanda Foster, for his CKD, with most recent labs, as follows:   - January 23: BUN 23, creatinine 1.8, and K 3.8  No Known Allergies  Current Outpatient Prescriptions  Medication Sig Dispense Refill  . amLODipine (NORVASC) 10 MG tablet Take 10 mg by mouth daily.      Marland Kitchen aspirin EC 81 MG tablet Take 81 mg by mouth daily.      Marland Kitchen atorvastatin (LIPITOR) 80 MG tablet Take 1 tablet (80 mg total) by mouth at bedtime.  30 tablet  3  . calcium-vitamin D (OSCAL WITH D) 500-200 MG-UNIT per tablet Take 1 tablet by mouth 3 (three) times daily.      . clopidogrel (PLAVIX) 75 MG tablet Take 1 tablet (75 mg total) by mouth daily with breakfast.  30 tablet  6  . hydrALAZINE (APRESOLINE) 25 MG tablet Take 25 mg by mouth 2 (two) times daily.      . hydrochlorothiazide (MICROZIDE) 12.5 MG capsule Take 1 capsule by mouth Daily.      . iron polysaccharides (NIFEREX) 150 MG capsule Take 150 mg by mouth daily.      . isosorbide mononitrate (IMDUR) 30 MG 24 hr tablet Take 1 tablet (30 mg total) by mouth daily.  30 tablet  6  . metoprolol tartrate (LOPRESSOR) 25 MG tablet Take 1 tablet (25 mg total) by mouth 2 (two) times daily.  60 tablet  6  . nitroGLYCERIN (NITROSTAT) 0.4 MG SL tablet Place 1 tablet (0.4 mg total) under the tongue every 5 (five) minutes as needed for chest pain (up to 3  doses).  25 tablet  3  . sodium bicarbonate 650 MG tablet Take 1 tablet (650 mg total) by mouth 2 (two) times daily.  60 tablet  0    Past Medical History  Diagnosis Date  . Hypertension   . CKD (chronic kidney disease), stage IV   . Tobacco abuse   . Claudication     a. R leg with bilateral femoral bruits.  Marland Kitchen CAD (coronary artery disease)     Cath 1/15 showed critical LAD stenosis, chronically occluded RCA, mod distal LCx dz s/p PTCA/DES to LAD   . Dyslipidemia   . Chronic anemia     No past surgical history on file.  History   Social History  . Marital Status: Widowed    Spouse Name: N/A    Number of Children: N/A  . Years of Education: N/A   Occupational History  . Not on file.   Social History Main Topics  . Smoking status: Former Smoker -- 0.5 packs/day for 45 years    Types: Cigarettes    Start date: 01/28/1968    Quit date: 02/06/2012  . Smokeless tobacco: Never Used  . Alcohol Use: No  . Drug Use: No  . Sexually Active: Not on file   Other Topics Concern  .  Not on file   Social History Narrative  . No narrative on file    No family history on file.  ROS: no nausea, vomiting; no fever, chills; no melena, hematochezia; no claudication  PHYSICAL EXAM: BP 131/68  Pulse 60  Ht 5\' 6"  (1.676 m)  Wt 184 lb (83.462 kg)  BMI 29.70 kg/m2 GENERAL:  79 year old male ; NAD HEENT: NCAT, PERRLA, EOMI; sclera clear; no xanthelasma NECK: palpable bilateral carotid pulses, no bruits; no JVD; no TM LUNGS: CTA bilaterally CARDIAC: RRR (S1, S2); no significant murmurs; no rubs or gallops ABDOMEN: soft, non-tender; intact BS EXTREMETIES: no significant peripheral edema SKIN: warm/dry; no obvious rash/lesions MUSCULOSKELETAL: no joint deformity NEURO: no focal deficit; NL affect   EKG:    ASSESSMENT & PLAN:  CAD (coronary artery disease) Continue current medication regimen, including DAPT with ASA/Plavix for at least one year. Will schedule return visit in 3  months, with Dr. Domenic Polite. Will refer patient for cardiac rehabilitation.  Tobacco abuse Patient has since stopped smoking  CKD (chronic kidney disease) stage 4, GFR 15-29 ml/min Followed by Dr. Lowanda Foster  Essential hypertension, benign Stable on current regimen  Hyperlipidemia Continue high dose Lipitor, recently added, and reassess lipid status in 12 weeks. Aggressive management recommended with target LDL 70 or less, if feasible.  Femoral bruit Patient recently found to have bilateral femoral bruits. We'll order LE segmental arterial Dopplers to rule out significant CAD.    Kevin [name], PAC

## 2012-02-25 NOTE — Patient Instructions (Addendum)
Your physician recommends that you schedule a follow-up appointment in: 3 months with Dr. Domenic Polite. Your physician recommends that you continue on your current medications as directed. Please refer to the Current Medication list given to you today.  Your physician recommends that you return for a FASTING lipid/liver profile in 12 weeks around May 19, 2012 at [facility]. Your physician has requested that you have an ankle brachial index (ABI). During this test an ultrasound and blood pressure cuff are used to evaluate the arteries that supply the arms and legs with blood. Allow thirty minutes for this exam. There are no restrictions or special instructions. You have been referred to Cardiac Rehab. You will be contacted about this appointment.

## 2012-02-25 NOTE — Assessment & Plan Note (Signed)
Patient recently found to have bilateral femoral bruits. We'll order LE segmental arterial Dopplers to rule out significant CAD.

## 2012-02-25 NOTE — Assessment & Plan Note (Signed)
Stable on current regimen   

## 2012-02-25 NOTE — Assessment & Plan Note (Signed)
Continue high dose Lipitor, recently added, and reassess lipid status in 12 weeks. Aggressive management recommended with target LDL 70 or less, if feasible.

## 2012-03-04 ENCOUNTER — Encounter (INDEPENDENT_AMBULATORY_CARE_PROVIDER_SITE_OTHER): Payer: Medicare Other

## 2012-03-04 DIAGNOSIS — R0989 Other specified symptoms and signs involving the circulatory and respiratory systems: Secondary | ICD-10-CM

## 2012-03-04 DIAGNOSIS — I739 Peripheral vascular disease, unspecified: Secondary | ICD-10-CM

## 2012-03-09 ENCOUNTER — Telehealth: Payer: Self-pay | Admitting: *Deleted

## 2012-03-09 NOTE — Telephone Encounter (Signed)
Notes Recorded by Laurine Blazer, LPN on [id number] at [id number] AM Patient notified. States he would prefer to hold off on going to [facility] right now. States he sees PMD (Vyas) around 1st of March & sees SM back 05/26/2012. Will forward results to PMD also.

## 2012-03-09 NOTE — Telephone Encounter (Signed)
Message copied by Laurine Blazer on Tue Mar 09, 2012  9:11 AM ------      Message from: Aurora Mask C      Created: Mon Mar 08, 2012 11:38 AM       NL ABIs, but may be falsely elevated. Recommend formal eval with [facility] in [place] to r/o sig PAD. ------

## 2012-03-17 ENCOUNTER — Other Ambulatory Visit (HOSPITAL_COMMUNITY): Payer: Self-pay | Admitting: Cardiology

## 2012-05-21 ENCOUNTER — Encounter: Payer: Self-pay | Admitting: *Deleted

## 2012-05-25 ENCOUNTER — Encounter: Payer: Self-pay | Admitting: Cardiology

## 2012-05-26 ENCOUNTER — Encounter: Payer: Self-pay | Admitting: Cardiology

## 2012-05-26 ENCOUNTER — Ambulatory Visit (INDEPENDENT_AMBULATORY_CARE_PROVIDER_SITE_OTHER): Payer: Medicare Other | Admitting: Cardiology

## 2012-05-26 VITALS — BP 151/68 | HR 56 | Ht 66.0 in | Wt 192.0 lb

## 2012-05-26 DIAGNOSIS — I739 Peripheral vascular disease, unspecified: Secondary | ICD-10-CM

## 2012-05-26 DIAGNOSIS — I2510 Atherosclerotic heart disease of native coronary artery without angina pectoris: Secondary | ICD-10-CM

## 2012-05-26 DIAGNOSIS — I10 Essential (primary) hypertension: Secondary | ICD-10-CM

## 2012-05-26 DIAGNOSIS — E785 Hyperlipidemia, unspecified: Secondary | ICD-10-CM

## 2012-05-26 NOTE — Assessment & Plan Note (Signed)
Symptomatically stable on medical therapy following intervention back in January as outlined. Would prefer a year of aspirin and Plavix,  uninterrupted if possible, and would therefore defer elective colonoscopy until next January unless there are more urgent concerns. Clinical visit in 6 months,. Sooner if needed.

## 2012-05-26 NOTE — Progress Notes (Signed)
Clinical Summary Kevin [name] is a 79 y.o.male last seen in the office for Kevin [name] in January. He has been doing well, no recurrent angina or nitroglycerin use.  Recent lab work revealed normal LFTs, triglycerides 88, cholesterol 143, HDL 48, LDL 77. He is tolerating Lipitor.  Lower extremity arterial Dopplers from February of this year suggested mild aortoiliac disease, right distal SFA greater than 50% stenosis, left distal SFA less than 50% stenosis, probable bilateral tibial disease. ABIs were normal however felt to be falsely elevated secondary to medial calcification. I reviewed these today. Kevin [name] does not describe any progressive claudication symptoms, mainly seems to be limited by arthritic hip pain. We did discuss possible referral to a vascular specialist ultimately, however will continue medical therapy for now.  He will be seeing Dr. Britta [name] soon to discuss elective colonoscopy. If possible, would defer until next January so that he can complete a full year DAPT without interruption.   No Known Allergies  Current Outpatient Prescriptions  Medication Sig Dispense Refill  . amLODipine (NORVASC) 10 MG tablet Take 10 mg by mouth daily.      Marland Kitchen aspirin EC 81 MG tablet Take 81 mg by mouth daily.      Marland Kitchen atorvastatin (LIPITOR) 80 MG tablet Take 1 tablet (80 mg total) by mouth at bedtime.  30 tablet  3  . clopidogrel (PLAVIX) 75 MG tablet Take 1 tablet (75 mg total) by mouth daily with breakfast.  30 tablet  6  . ergocalciferol (VITAMIN D2) 50000 UNITS capsule Take 50,000 Units by mouth once a week.      . hydrALAZINE (APRESOLINE) 25 MG tablet Take 25 mg by mouth 2 (two) times daily.      . hydrochlorothiazide (MICROZIDE) 12.5 MG capsule Take 1 capsule by mouth Daily.      . iron polysaccharides (NIFEREX) 150 MG capsule Take 150 mg by mouth daily.      . isosorbide mononitrate (IMDUR) 30 MG 24 hr tablet Take 1 tablet (30 mg total) by mouth daily.  30 tablet  6  . metoprolol tartrate  (LOPRESSOR) 25 MG tablet Take 1 tablet (25 mg total) by mouth 2 (two) times daily.  60 tablet  6  . nitroGLYCERIN (NITROSTAT) 0.4 MG SL tablet Place 1 tablet (0.4 mg total) under the tongue every 5 (five) minutes as needed for chest pain (up to 3 doses).  25 tablet  3  . sodium bicarbonate 650 MG tablet Take 1 tablet (650 mg total) by mouth 2 (two) times daily.  60 tablet  0   No current facility-administered medications for this visit.    Past Medical History  Diagnosis Date  . Essential hypertension, benign   . CKD (chronic kidney disease), stage IV   . Tobacco abuse   . Claudication     R leg with bilateral femoral bruits.  . Coronary atherosclerosis of native coronary artery     Cath 1/15 showed critical LAD stenosis, chronically occluded RCA, mod distal LCx dz s/p PTCA/DES to LAD   . Dyslipidemia   . Chronic anemia     Past Surgical History  Procedure Laterality Date  . Bilateral carpal tunnel release      Social History Kevin [name] reports that he quit smoking about 3 months ago. His smoking use included Cigarettes. He started smoking about 44 years ago. He has a 22.5 pack-year smoking history. He has never used smokeless tobacco. Kevin [name] reports that he does not drink alcohol.  Review  of Systems As outlined above, otherwise negative.  Physical Examination Filed Vitals:   05/26/12 1423  BP: 151/68  Pulse: 56   Filed Weights   05/26/12 1423  Weight: 192 lb (87.091 kg)   No acute distress. HEENT: Conjunctiva and lids normal, oropharynx clear. Neck: Supple, no elevated JVP or carotid bruits, no thyromegaly. Lungs: Clear to auscultation, nonlabored breathing at rest. Cardiac: Regular rate and rhythm, no S3 or significant systolic murmur, no pericardial rub. Abdomen: Soft, nontender, bowel sounds present. Extremities: No pitting edema, distal pulses 1+. Skin: Warm and dry. Musculoskeletal: No kyphosis. Neuropsychiatric: Alert and oriented x3, affect grossly  appropriate.   Problem List and Plan   Coronary atherosclerosis of native coronary artery Symptomatically stable on medical therapy following intervention back in January as outlined. Would prefer a year of aspirin and Plavix,  uninterrupted if possible, and would therefore defer elective colonoscopy until next January unless there are more urgent concerns. Clinical visit in 6 months,. Sooner if needed.  Essential hypertension, benign Continue current regimen. Followup with Dr. Woody Seller.  Hyperlipidemia LDL at goal, continue Lipitor.  Peripheral arterial disease Outlined above, no progressive claudication, no wounds or ulcers. Continue medical therapy and observation. He has quit smoking.    Satira Sark, M.D., F.A.C.C.

## 2012-05-26 NOTE — Assessment & Plan Note (Signed)
LDL at goal, continue Lipitor.

## 2012-05-26 NOTE — Patient Instructions (Addendum)

## 2012-05-26 NOTE — Assessment & Plan Note (Signed)
Outlined above, no progressive claudication, no wounds or ulcers. Continue medical therapy and observation. He has quit smoking.

## 2012-05-26 NOTE — Assessment & Plan Note (Signed)
Continue current regimen. Followup with Dr. Woody Seller.

## 2012-07-05 ENCOUNTER — Other Ambulatory Visit (HOSPITAL_COMMUNITY): Payer: Self-pay | Admitting: Cardiology

## 2012-09-06 ENCOUNTER — Other Ambulatory Visit (HOSPITAL_COMMUNITY): Payer: Self-pay | Admitting: Cardiology

## 2012-09-13 ENCOUNTER — Telehealth: Payer: Self-pay | Admitting: Cardiology

## 2012-09-13 NOTE — Telephone Encounter (Signed)
Patient informed that he didn't have any orders for pending lab work.

## 2012-09-13 NOTE — Telephone Encounter (Signed)
Patient is scheduled in Nov to see Dr Domenic Polite.  He would like to know if he needs bloodwork before

## 2012-09-20 ENCOUNTER — Other Ambulatory Visit (HOSPITAL_COMMUNITY): Payer: Self-pay | Admitting: Cardiology

## 2012-12-03 ENCOUNTER — Encounter: Payer: Self-pay | Admitting: Cardiology

## 2012-12-03 ENCOUNTER — Ambulatory Visit (INDEPENDENT_AMBULATORY_CARE_PROVIDER_SITE_OTHER): Payer: Medicare Other | Admitting: Cardiology

## 2012-12-03 VITALS — BP 134/70 | HR 60 | Ht 66.0 in | Wt 189.4 lb

## 2012-12-03 DIAGNOSIS — I739 Peripheral vascular disease, unspecified: Secondary | ICD-10-CM

## 2012-12-03 DIAGNOSIS — E785 Hyperlipidemia, unspecified: Secondary | ICD-10-CM

## 2012-12-03 DIAGNOSIS — I10 Essential (primary) hypertension: Secondary | ICD-10-CM

## 2012-12-03 DIAGNOSIS — I2510 Atherosclerotic heart disease of native coronary artery without angina pectoris: Secondary | ICD-10-CM

## 2012-12-03 NOTE — Progress Notes (Signed)
Clinical Summary Kevin [name] is an 79 y.o.male last seen in April of this year. He is doing well from a cardiac perspective. No angina symptoms, reportedly mild claudication at times. He reports compliance with his medications. 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 Also tries to get out and walk on his street twice a day with his dog.  Lab work from April showed normal LFTs, cholesterol 143, triglycerides 88, HDL 40, LDL 77.  We reviewed his medications.  No Known Allergies  Current Outpatient Prescriptions  Medication Sig Dispense Refill  . amLODipine (NORVASC) 10 MG tablet Take 10 mg by mouth daily.      Marland Kitchen aspirin EC 81 MG tablet Take 81 mg by mouth daily.      Marland Kitchen atorvastatin (LIPITOR) 80 MG tablet TAKE ONE TABLET AT BEDTIME  30 tablet  5  . clopidogrel (PLAVIX) 75 MG tablet TAKE ONE TABLET ONCE DAILY WITH BREAKFAST  30 tablet  60  . hydrALAZINE (APRESOLINE) 25 MG tablet Take 25 mg by mouth 2 (two) times daily.      . hydrochlorothiazide (MICROZIDE) 12.5 MG capsule Take 1 capsule by mouth Daily.      . iron polysaccharides (NIFEREX) 150 MG capsule Take 150 mg by mouth daily.      . isosorbide mononitrate (IMDUR) 30 MG 24 hr tablet TAKE ONE TABLET ONCE DAILY  30 tablet  6  . metoprolol tartrate (LOPRESSOR) 25 MG tablet TAKE ONE TABLET TWICE DAILY  60 tablet  6  . nitroGLYCERIN (NITROSTAT) 0.4 MG SL tablet Place 1 tablet (0.4 mg total) under the tongue every 5 (five) minutes as needed for chest pain (up to 3 doses).  25 tablet  3  . sodium bicarbonate 650 MG tablet Take 1 tablet (650 mg total) by mouth 2 (two) times daily.  60 tablet  0   No current facility-administered medications for this visit.    Past Medical History  Diagnosis Date  . Essential hypertension, benign   . CKD (chronic kidney disease), stage IV   . Tobacco abuse   . Claudication     R leg with bilateral femoral bruits.  . Coronary atherosclerosis of native  coronary artery     Cath 1/15 showed critical LAD stenosis, chronically occluded RCA, mod distal LCx dz s/p PTCA/DES to LAD   . Dyslipidemia   . Chronic anemia     Social History Kevin [name] reports that he quit smoking about 9 months ago. His smoking use included Cigarettes. He started smoking about 44 years ago. He has a 22.5 pack-year smoking history. He has never used smokeless tobacco. Kevin [name] reports that he does not drink alcohol.  Review of Systems No bleeding problems. Stable appetite. No palpitations or syncope. Otherwise negative except as outlined.  Physical Examination Filed Vitals:   12/03/12 0839  BP: 134/70  Pulse: 60   Filed Weights   12/03/12 0839  Weight: 189 lb 6.4 oz (85.911 kg)    No acute distress.  HEENT: Conjunctiva and lids normal, oropharynx clear.  Neck: Supple, no elevated JVP or carotid bruits, no thyromegaly.  Lungs: Clear to auscultation, nonlabored breathing at rest.  Cardiac: Regular rate and rhythm, no S3 or significant systolic murmur, no pericardial rub.  Abdomen: Soft, nontender, bowel sounds present.  Extremities: No pitting edema, distal pulses 1+.  Skin: Warm and dry.  Musculoskeletal: No kyphosis.  Neuropsychiatric: Alert and oriented x3, affect grossly appropriate.  Problem List and Plan   Coronary atherosclerosis of native coronary artery Symptomatically stable on medical therapy. Patient continues on DAPT status post DES placement in January as outlined above. As noted in the previous visit, colonoscopy could be pursued off DAPT in late January 2015,  Essential hypertension, benign No change to current regimen.  Hyperlipidemia LDL 77 in April. Followup FLP and LFT for next visit.  Peripheral arterial disease Symptomatically stable, outlined in prior note. Continue medical therapy and observation.    Satira Sark, M.D., F.A.C.C.

## 2012-12-03 NOTE — Assessment & Plan Note (Signed)
No change to current regimen. 

## 2012-12-03 NOTE — Assessment & Plan Note (Signed)
Symptomatically stable on medical therapy. Patient continues on DAPT status post DES placement in January as outlined above. As noted in the previous visit, colonoscopy could be pursued off DAPT in late January 2015,

## 2012-12-03 NOTE — Patient Instructions (Signed)
Your physician recommends that you schedule a follow-up appointment in: 6 months. You will receive a reminder letter in the mail in about 4 months reminding you to call and schedule your appointment. If you don't receive this letter, please contact our office. Your physician recommends that you continue on your current medications as directed. Please refer to the Current Medication list given to you today. Your physician recommends that you have a FASTING lipid/liver profile in 6 months just before your next visit. Please do not eat or drink for at least 8 hours when you have this done. We will contact you about this when its time to be done.

## 2012-12-03 NOTE — Assessment & Plan Note (Signed)
LDL 77 in April. Followup FLP and LFT for next visit.

## 2012-12-03 NOTE — Assessment & Plan Note (Signed)
Symptomatically stable, outlined in prior note. Continue medical therapy and observation.

## 2013-02-07 ENCOUNTER — Telehealth: Payer: Self-pay | Admitting: Cardiology

## 2013-02-07 NOTE — Telephone Encounter (Signed)
Patient is now one year out from DES to the LAD, so he should be able to hold Plavix temporarily for tooth extraction if necessary. Generally this would need to be held one week prior to the extraction.

## 2013-02-07 NOTE — Telephone Encounter (Signed)
Kevin [name] states that he needs to have a tooth extraction . Wants to know if he needs to come off of blood thinner.

## 2013-02-08 NOTE — Telephone Encounter (Signed)
Patient informed. 

## 2013-03-16 ENCOUNTER — Other Ambulatory Visit: Payer: Self-pay | Admitting: *Deleted

## 2013-03-16 DIAGNOSIS — E785 Hyperlipidemia, unspecified: Secondary | ICD-10-CM

## 2013-03-16 DIAGNOSIS — Z79899 Other long term (current) drug therapy: Secondary | ICD-10-CM

## 2013-03-16 NOTE — Progress Notes (Signed)
LAB ORDERS MAILED TO PATIENT

## 2013-04-05 ENCOUNTER — Other Ambulatory Visit (HOSPITAL_COMMUNITY): Payer: Self-pay | Admitting: Cardiology

## 2013-06-07 ENCOUNTER — Encounter: Payer: Self-pay | Admitting: Cardiology

## 2013-06-07 NOTE — Progress Notes (Signed)
Clinical Summary Kevin [name] is an 80 y.o.male last seen in November 2014. He reports no angina symptoms, no change in baseline shortness of breath. 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  Recent lab work from May showed normal LFTs, cholesterol 209, triglycerides 116, HDL 36, LDL 150. He ran out of Lipitor month ago, had no further refills.  We reviewed his medications. ECG today shows sinus rhythm, short PR, otherwise normal.   No Known Allergies  Current Outpatient Prescriptions  Medication Sig Dispense Refill  . amLODipine (NORVASC) 10 MG tablet Take 10 mg by mouth daily.      Marland Kitchen aspirin EC 81 MG tablet Take 81 mg by mouth daily.      . cholecalciferol (VITAMIN D) 1000 UNITS tablet Take 1,000 Units by mouth daily.      . clopidogrel (PLAVIX) 75 MG tablet TAKE ONE TABLET ONCE DAILY WITH BREAKFAST  30 tablet  6  . hydrALAZINE (APRESOLINE) 25 MG tablet Take 25 mg by mouth 2 (two) times daily.      . hydrochlorothiazide (MICROZIDE) 12.5 MG capsule Take 1 capsule by mouth Daily.      . iron polysaccharides (NIFEREX) 150 MG capsule Take 150 mg by mouth daily.      . isosorbide mononitrate (IMDUR) 30 MG 24 hr tablet TAKE ONE TABLET ONCE DAILY  30 tablet  6  . lisinopril (PRINIVIL,ZESTRIL) 5 MG tablet Take 1 tablet by mouth daily.      . metoprolol tartrate (LOPRESSOR) 25 MG tablet TAKE ONE TABLET TWICE DAILY  60 tablet  6  . nitroGLYCERIN (NITROSTAT) 0.4 MG SL tablet Place 1 tablet (0.4 mg total) under the tongue every 5 (five) minutes as needed for chest pain (up to 3 doses).  25 tablet  3  . sodium bicarbonate 650 MG tablet Take 1 tablet (650 mg total) by mouth 2 (two) times daily.  60 tablet  0  . atorvastatin (LIPITOR) 80 MG tablet TAKE ONE TABLET AT BEDTIME  30 tablet  6   No current facility-administered medications for this visit.    Past Medical History  Diagnosis Date  . Essential hypertension, benign   . CKD (chronic kidney  disease), stage IV   . Tobacco abuse   . Claudication     R leg with bilateral femoral bruits.  . Coronary atherosclerosis of native coronary artery     Cath 1/15 showed critical LAD stenosis, chronically occluded RCA, mod distal LCx dz s/p PTCA/DES to LAD   . Dyslipidemia   . Chronic anemia     Social History Kevin [name] reports that he quit smoking about 16 months ago. His smoking use included Cigarettes. He started smoking about 45 years ago. He has a 22.5 pack-year smoking history. He has never used smokeless tobacco. Kevin [name] reports that he does not drink alcohol.  Review of Systems No palpitations, dizziness, syncope. Otherwise as outlined.  Physical Examination Filed Vitals:   06/08/13 0806  BP: 162/73  Pulse: 58   Filed Weights   06/08/13 0806  Weight: 188 lb (85.276 kg)    Appears comfortable at rest.  HEENT: Conjunctiva and lids normal, oropharynx clear.  Neck: Supple, no elevated JVP or carotid bruits, no thyromegaly.  Lungs: Clear to auscultation, nonlabored breathing at rest.  Cardiac: Regular rate and rhythm, no S3 or significant systolic murmur, no pericardial rub.  Abdomen: Soft, nontender, bowel sounds present.  Extremities: No pitting edema, distal pulses  1+.  Skin: Warm and dry.  Musculoskeletal: No kyphosis.  Neuropsychiatric: Alert and oriented x3, affect grossly appropriate.   Problem List and Plan   Coronary atherosclerosis of native coronary artery Symptomatically stable on medical therapy. ECG reviewed. Continue observation for now.  Hyperlipidemia LDL is 150, however out of Lipitor for the last month. Refill provided.  Essential hypertension, benign Blood pressure elevated today. He reports compliance with his antihypertensives. Keep followup with Dr. Woody Seller - can always have further up titration of hydralazine if needed.    Satira Sark, M.D., F.A.C.C.

## 2013-06-08 ENCOUNTER — Encounter: Payer: Self-pay | Admitting: Cardiology

## 2013-06-08 ENCOUNTER — Ambulatory Visit (INDEPENDENT_AMBULATORY_CARE_PROVIDER_SITE_OTHER): Payer: Medicare Other | Admitting: Cardiology

## 2013-06-08 VITALS — BP 162/73 | HR 58 | Ht 66.0 in | Wt 188.0 lb

## 2013-06-08 DIAGNOSIS — I2510 Atherosclerotic heart disease of native coronary artery without angina pectoris: Secondary | ICD-10-CM

## 2013-06-08 DIAGNOSIS — I10 Essential (primary) hypertension: Secondary | ICD-10-CM

## 2013-06-08 DIAGNOSIS — I200 Unstable angina: Secondary | ICD-10-CM

## 2013-06-08 MED ORDER — NITROGLYCERIN 0.4 MG SL SUBL: 0.4000 mg | SUBLINGUAL_TABLET | SUBLINGUAL | Status: DC | PRN

## 2013-06-08 MED ORDER — ATORVASTATIN CALCIUM 80 MG PO TABS: ORAL_TABLET | ORAL | Status: DC

## 2013-06-08 NOTE — Assessment & Plan Note (Signed)
Symptomatically stable on medical therapy. ECG reviewed. Continue observation for now.

## 2013-06-08 NOTE — Assessment & Plan Note (Signed)
LDL is 150, however out of Lipitor for the last month. Refill provided.

## 2013-06-08 NOTE — Patient Instructions (Signed)

## 2013-06-08 NOTE — Assessment & Plan Note (Signed)
Blood pressure elevated today. He reports compliance with his antihypertensives. Keep followup with Dr. Woody Seller - can always have further up titration of hydralazine if needed.

## 2013-09-09 ENCOUNTER — Other Ambulatory Visit: Payer: Self-pay | Admitting: *Deleted

## 2013-09-09 MED ORDER — METOPROLOL TARTRATE 25 MG PO TABS: 25.0000 mg | ORAL_TABLET | Freq: Two times a day (BID) | ORAL | Status: DC

## 2013-10-28 ENCOUNTER — Other Ambulatory Visit (HOSPITAL_COMMUNITY): Payer: Self-pay | Admitting: Cardiology

## 2013-11-09 ENCOUNTER — Other Ambulatory Visit (HOSPITAL_COMMUNITY): Payer: Self-pay | Admitting: Cardiology

## 2013-12-21 ENCOUNTER — Ambulatory Visit: Payer: Medicare Other | Admitting: Cardiology

## 2014-01-05 ENCOUNTER — Encounter (HOSPITAL_COMMUNITY): Payer: Self-pay | Admitting: Cardiology

## 2014-01-06 ENCOUNTER — Ambulatory Visit (INDEPENDENT_AMBULATORY_CARE_PROVIDER_SITE_OTHER): Payer: Medicare Other | Admitting: Cardiology

## 2014-01-06 ENCOUNTER — Encounter: Payer: Self-pay | Admitting: Cardiology

## 2014-01-06 VITALS — BP 132/65 | HR 54 | Ht 66.0 in | Wt 190.0 lb

## 2014-01-06 DIAGNOSIS — E785 Hyperlipidemia, unspecified: Secondary | ICD-10-CM

## 2014-01-06 DIAGNOSIS — I2510 Atherosclerotic heart disease of native coronary artery without angina pectoris: Secondary | ICD-10-CM

## 2014-01-06 DIAGNOSIS — I10 Essential (primary) hypertension: Secondary | ICD-10-CM

## 2014-01-06 DIAGNOSIS — I739 Peripheral vascular disease, unspecified: Secondary | ICD-10-CM

## 2014-01-06 NOTE — Patient Instructions (Addendum)
Your physician recommends that you schedule a follow-up appointment in: 6 months. You will receive a reminder letter in the mail in about 4 months reminding you to call and schedule your appointment. If you don't receive this letter, please contact our office. Your physician recommends that you continue on your current medications as directed. Please refer to the Current Medication list given to you today. You may stop plavix on January 27, 2014.

## 2014-01-06 NOTE — Assessment & Plan Note (Signed)
No progressive claudication symptoms. Continue antiplatelet therapy and aspirin.

## 2014-01-06 NOTE — Assessment & Plan Note (Signed)
Blood pressure is better controlled. No changes made to regimen today.

## 2014-01-06 NOTE — Progress Notes (Signed)
Reason for visit: CAD, PAD, hypertension  Clinical Summary Kevin [name] is an 80 y.o.male last seen in May. He presents for a routine visit. Reports no angina symptoms and compliance with his medications. 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 He walks for exercise a few days a week. Otherwise limited by arthritic pain. He denies having any difficulties with his medications, continues on aspirin and Plavix with history of DES back in January. We discussed stopping Plavix after December.  Labwork from May showed normal LFTs, triglycerides 116, cholesterol 209, HDL 36, LDL 150. He continues on Lipitor and has follow-up lab work pending with Dr. Woody Seller. Would optimally try and get LDL at least under 100, preferably closer to 70.  No increasing claudication symptoms. He continues on aspirin and statin.  Blood pressure is better controlled. He continues on multimodal therapy as detailed below.  No Known Allergies  Current Outpatient Prescriptions  Medication Sig Dispense Refill  . amLODipine (NORVASC) 10 MG tablet Take 10 mg by mouth daily.    Marland Kitchen aspirin EC 81 MG tablet Take 81 mg by mouth daily.    Marland Kitchen atorvastatin (LIPITOR) 80 MG tablet TAKE ONE TABLET AT BEDTIME 30 tablet 6  . cholecalciferol (VITAMIN D) 1000 UNITS tablet Take 1,000 Units by mouth daily.    . clopidogrel (PLAVIX) 75 MG tablet TAKE ONE TABLET ONCE DAILY WITH BREAKFAST 30 tablet 6  . hydrALAZINE (APRESOLINE) 25 MG tablet Take 25 mg by mouth 2 (two) times daily.    . hydrochlorothiazide (MICROZIDE) 12.5 MG capsule Take 1 capsule by mouth Daily.    . iron polysaccharides (NIFEREX) 150 MG capsule Take 150 mg by mouth daily.    . isosorbide mononitrate (IMDUR) 30 MG 24 hr tablet TAKE ONE TABLET ONCE DAILY 30 tablet 5  . lisinopril (PRINIVIL,ZESTRIL) 5 MG tablet Take 1 tablet by mouth daily.    . metoprolol tartrate (LOPRESSOR) 25 MG tablet Take 1 tablet (25 mg total) by mouth 2 (two) times daily. 60 tablet 3  .  nitroGLYCERIN (NITROSTAT) 0.4 MG SL tablet Place 1 tablet (0.4 mg total) under the tongue every 5 (five) minutes as needed for chest pain (up to 3 doses). 25 tablet 3  . sodium bicarbonate 650 MG tablet Take 1 tablet (650 mg total) by mouth 2 (two) times daily. 60 tablet 0   No current facility-administered medications for this visit.    Past Medical History  Diagnosis Date  . Essential hypertension, benign   . CKD (chronic kidney disease), stage IV   . Tobacco abuse   . Claudication     R leg with bilateral femoral bruits.  . Coronary atherosclerosis of native coronary artery     Cath 1/15 showed critical LAD stenosis, chronically occluded RCA, mod distal LCx dz s/p PTCA/DES to LAD   . Dyslipidemia   . Chronic anemia     Past Surgical History  Procedure Laterality Date  . Bilateral carpal tunnel release    . Left heart catheterization with coronary angiogram N/A 02/09/2012    Procedure: LEFT HEART CATHETERIZATION WITH CORONARY ANGIOGRAM;  Surgeon: Peter M Martinique, MD;  Location: [facility] CATH LAB;  Service: Cardiovascular;  Laterality: N/A;  . Percutaneous coronary stent intervention (pci-s) N/A 02/11/2012    Procedure: PERCUTANEOUS CORONARY STENT INTERVENTION (PCI-S);  Surgeon: Burnell Blanks, MD;  Location: [facility] CATH LAB;  Service: Cardiovascular;  Laterality: N/A;    Social History Kevin [name] reports that he quit smoking about 23 months ago.  His smoking use included Cigarettes. He started smoking about 45 years ago. He has a 22.5 pack-year smoking history. He has never used smokeless tobacco. Kevin [name] reports that he does not drink alcohol.  Review of Systems Complete review of systems negative except as otherwise outlined in the clinical summary.  Physical Examination Filed Vitals:   01/06/14 1022  BP: 132/65  Pulse: 54   Filed Weights   01/06/14 1022  Weight: 190 lb (86.183 kg)   Appears comfortable at rest.  HEENT: Conjunctiva and lids normal, oropharynx clear.   Neck: Supple, no elevated JVP or carotid bruits, no thyromegaly.  Lungs: Clear to auscultation, nonlabored breathing at rest.  Cardiac: Regular rate and rhythm, no S3 or significant systolic murmur, no pericardial rub.  Abdomen: Soft, nontender, bowel sounds present.  Extremities: No pitting edema, distal pulses 1+.    Problem List and Plan   Coronary atherosclerosis of native coronary artery No active angina symptoms on medical therapy. He will stop Plavix after December and go to an aspirin alone at that time, DES to the LAD in January 2015. Otherwise continue current medical regimen. Follow-up in 6 months.  Essential hypertension, benign Blood pressure is better controlled. No changes made to regimen today.  Peripheral arterial disease No progressive claudication symptoms. Continue antiplatelet therapy and aspirin.  Hyperlipidemia He continues on high-dose Lipitor. Keep follow-up with Dr. Woody Seller, labwork pending.    Satira Sark, M.D., F.A.C.C.

## 2014-01-06 NOTE — Assessment & Plan Note (Signed)
No active angina symptoms on medical therapy. He will stop Plavix after December and go to an aspirin alone at that time, DES to the LAD in January 2015. Otherwise continue current medical regimen. Follow-up in 6 months.

## 2014-01-06 NOTE — Assessment & Plan Note (Signed)
He continues on high-dose Lipitor. Keep follow-up with Dr. Woody Seller, labwork pending.

## 2014-03-16 ENCOUNTER — Other Ambulatory Visit: Payer: Self-pay | Admitting: Cardiology

## 2014-04-28 ENCOUNTER — Other Ambulatory Visit: Payer: Self-pay | Admitting: *Deleted

## 2014-04-28 MED ORDER — ISOSORBIDE MONONITRATE ER 30 MG PO TB24: 30.0000 mg | ORAL_TABLET | Freq: Every day | ORAL | Status: DC

## 2014-04-28 MED ORDER — METOPROLOL TARTRATE 25 MG PO TABS: 25.0000 mg | ORAL_TABLET | Freq: Two times a day (BID) | ORAL | Status: DC

## 2014-06-28 ENCOUNTER — Encounter: Payer: Self-pay | Admitting: Cardiology

## 2014-06-28 ENCOUNTER — Ambulatory Visit (INDEPENDENT_AMBULATORY_CARE_PROVIDER_SITE_OTHER): Payer: Medicare Other | Admitting: Cardiology

## 2014-06-28 VITALS — BP 156/70 | HR 56 | Ht 66.0 in | Wt 196.0 lb

## 2014-06-28 DIAGNOSIS — I2510 Atherosclerotic heart disease of native coronary artery without angina pectoris: Secondary | ICD-10-CM

## 2014-06-28 DIAGNOSIS — N184 Chronic kidney disease, stage 4 (severe): Secondary | ICD-10-CM

## 2014-06-28 DIAGNOSIS — I10 Essential (primary) hypertension: Secondary | ICD-10-CM | POA: Diagnosis not present

## 2014-06-28 DIAGNOSIS — E785 Hyperlipidemia, unspecified: Secondary | ICD-10-CM

## 2014-06-28 NOTE — Progress Notes (Signed)
Cardiology Office Note  Date: 06/28/2014   ID: Kevin [name], DOB 1930-03-21, MRN [id number]  PCP: Glenda Chroman., MD  Primary Cardiologist: Rozann Lesches, MD   Chief Complaint  Patient presents with  . Coronary Artery Disease  . Hyperlipidemia    History of Present Illness: Kevin [name] is an 81 y.o. male last seen in December 2015. He presents for a routine follow-up visit. Since I last saw him, he has come off of Plavix as we planned, does not report any angina symptoms with typical activities. 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 He enjoys walking for exercise.  We reviewed his medications, he states that lisinopril has been decreased by Dr. Lowanda Foster related to hyperkalemia. Blood pressure is mildly elevated today, although he has not yet taken his medications. He states that his systolics are generally in the 130s at home.  Follow-up ECG today is reviewed below.  He states that he will be having follow-up lab work with Dr. Woody Seller later this month.   Past Medical History  Diagnosis Date  . Essential hypertension, benign   . CKD (chronic kidney disease), stage IV   . Tobacco abuse   . Claudication     R leg with bilateral femoral bruits.  . Coronary atherosclerosis of native coronary artery     Cath 1/15 showed critical LAD stenosis, chronically occluded RCA, mod distal LCx dz s/p PTCA/DES to LAD   . Dyslipidemia   . Chronic anemia     Past Surgical History  Procedure Laterality Date  . Bilateral carpal tunnel release    . Left heart catheterization with coronary angiogram N/A 02/09/2012    Procedure: LEFT HEART CATHETERIZATION WITH CORONARY ANGIOGRAM;  Surgeon: Peter M Martinique, MD;  Location: [facility] CATH LAB;  Service: Cardiovascular;  Laterality: N/A;  . Percutaneous coronary stent intervention (pci-s) N/A 02/11/2012    Procedure: PERCUTANEOUS CORONARY STENT INTERVENTION (PCI-S);  Surgeon: Burnell Blanks, MD;  Location: [facility] CATH LAB;  Service: Cardiovascular;  Laterality: N/A;    Current Outpatient Prescriptions  Medication Sig Dispense Refill  . amLODipine (NORVASC) 10 MG tablet Take 10 mg by mouth daily.    Marland Kitchen aspirin EC 81 MG tablet Take 81 mg by mouth daily.    Marland Kitchen atorvastatin (LIPITOR) 80 MG tablet TAKE ONE TABLET BY MOUTH AT BEDTIME 30 tablet 6  . cholecalciferol (VITAMIN D) 1000 UNITS tablet Take 1,000 Units by mouth daily.    . hydrALAZINE (APRESOLINE) 25 MG tablet Take 25 mg by mouth 2 (two) times daily.    . hydrochlorothiazide (MICROZIDE) 12.5 MG capsule Take 1 capsule by mouth Daily.    . iron polysaccharides (NIFEREX) 150 MG capsule Take 150 mg by mouth daily.    . isosorbide mononitrate (IMDUR) 30 MG 24 hr tablet Take 1 tablet (30 mg total) by mouth daily. 30 tablet 6  . lisinopril (PRINIVIL,ZESTRIL) 5 MG tablet Take 0.5 tablets by mouth daily.     . metoprolol tartrate (LOPRESSOR) 25 MG tablet Take 1 tablet (25 mg total) by mouth 2 (two) times daily. 60 tablet 6  . nitroGLYCERIN (NITROSTAT) 0.4 MG SL tablet Place 1 tablet (0.4 mg total) under the tongue every 5 (five) minutes as needed for chest pain (up to 3 doses). 25 tablet 3  . sodium bicarbonate 650 MG tablet Take 1 tablet (650 mg total) by mouth 2 (two) times daily. 60 tablet 0   No current facility-administered medications  for this visit.    Allergies:  Review of patient's allergies indicates no known allergies.   Social History: The patient  reports that he quit smoking about 2 years ago. His smoking use included Cigarettes. He started smoking about 46 years ago. He has a 22.5 pack-year smoking history. He has never used smokeless tobacco. He reports that he does not drink alcohol or use illicit drugs.   ROS:  Please see the history of present illness. Otherwise, complete review of systems is positive for none.  All other systems are reviewed and negative.   Physical Exam: VS:  BP 156/70 mmHg  Pulse 56  Ht  5\' 6"  (1.676 m)  Wt 196 lb (88.905 kg)  BMI 31.65 kg/m2  SpO2 97%, BMI Body mass index is 31.65 kg/(m^2).  Wt Readings from Last 3 Encounters:  06/28/14 196 lb (88.905 kg)  01/06/14 190 lb (86.183 kg)  06/08/13 188 lb (85.276 kg)     Appears comfortable at rest.  HEENT: Conjunctiva and lids normal, oropharynx clear.  Neck: Supple, no elevated JVP or carotid bruits, no thyromegaly.  Lungs: Clear to auscultation, nonlabored breathing at rest.  Cardiac: Regular rate and rhythm, no S3 or significant systolic murmur, no pericardial rub.  Abdomen: Soft, nontender, bowel sounds present.  Extremities: No pitting edema, distal pulses 1+.  Skin: Warm and dry. Musculoskeletal: No kyphosis. Neuropsychiatric: Alert and oriented 3, affect appropriate.   ECG: ECG is ordered today and shows sinus bradycardia with short PR interval and nonspecific T-wave changes.   Recent Labwork:   Other Studies Reviewed Today:  Echocardiogram 02/10/2012: Study Conclusions  - Left ventricle: The cavity size was normal. Wall thickness was normal. Systolic function was normal. The estimated ejection fraction was in the range of 50% to 55%. Wall motion was normal; there were no regional wall motion abnormalities. Doppler parameters are consistent with abnormal left ventricular relaxation (grade 1 diastolic dysfunction). - Left atrium: The atrium was mildly dilated.   Assessment and Plan:  1. Symptomatically stable CAD status post DES to the LAD with chronically occluded RCA as of January 2015. Continue current medications, walking regimen for exercise, observation.  2. Hyperlipidemia, on high-dose Lipitor. Patient is for follow-up lab work with Dr. Woody Seller later this month.  3. Essential hypertension, no changes made to current regimen.  4. CKD stage IV, followed by Dr. Lowanda Foster.  Current medicines were reviewed with the patient today.   Orders Placed This Encounter  Procedures    . EKG 12-Lead    Disposition: FU with me in 6 months.   Signed, Satira Sark, MD, [facility] 06/28/2014 8:40 AM    [facility] [place], [place], [place] [address] Phone: [phone]; Fax: [phone]

## 2014-06-28 NOTE — Patient Instructions (Signed)
Your physician recommends that you continue on your current medications as directed. Please refer to the Current Medication list given to you today. Your physician recommends that you schedule a follow-up appointment in: 6 months. You will receive a reminder letter in the mail in about 4 months reminding you to call and schedule your appointment. If you don't receive this letter, please contact our office. 

## 2014-10-06 ENCOUNTER — Inpatient Hospital Stay (HOSPITAL_COMMUNITY)
Admission: AD | Admit: 2014-10-06 | Discharge: 2014-10-11 | DRG: 287 | Disposition: A | Discharge status: Home / Self Care | Payer: Medicare Other | Source: Other Acute Inpatient Hospital | Attending: Cardiovascular Disease | Admitting: Cardiovascular Disease

## 2014-10-06 ENCOUNTER — Encounter (HOSPITAL_COMMUNITY): Payer: Self-pay | Admitting: Physician Assistant

## 2014-10-06 DIAGNOSIS — Z955 Presence of coronary angioplasty implant and graft: Secondary | ICD-10-CM

## 2014-10-06 DIAGNOSIS — Z720 Tobacco use: Secondary | ICD-10-CM | POA: Diagnosis not present

## 2014-10-06 DIAGNOSIS — F17210 Nicotine dependence, cigarettes, uncomplicated: Secondary | ICD-10-CM | POA: Diagnosis not present

## 2014-10-06 DIAGNOSIS — M1990 Unspecified osteoarthritis, unspecified site: Secondary | ICD-10-CM | POA: Diagnosis not present

## 2014-10-06 DIAGNOSIS — I25110 Atherosclerotic heart disease of native coronary artery with unstable angina pectoris: Secondary | ICD-10-CM | POA: Diagnosis present

## 2014-10-06 DIAGNOSIS — I129 Hypertensive chronic kidney disease with stage 1 through stage 4 chronic kidney disease, or unspecified chronic kidney disease: Secondary | ICD-10-CM | POA: Diagnosis present

## 2014-10-06 DIAGNOSIS — R079 Chest pain, unspecified: Secondary | ICD-10-CM | POA: Diagnosis present

## 2014-10-06 DIAGNOSIS — I739 Peripheral vascular disease, unspecified: Secondary | ICD-10-CM | POA: Diagnosis present

## 2014-10-06 DIAGNOSIS — I10 Essential (primary) hypertension: Secondary | ICD-10-CM

## 2014-10-06 DIAGNOSIS — E785 Hyperlipidemia, unspecified: Secondary | ICD-10-CM | POA: Diagnosis present

## 2014-10-06 DIAGNOSIS — Z79899 Other long term (current) drug therapy: Secondary | ICD-10-CM | POA: Diagnosis not present

## 2014-10-06 DIAGNOSIS — D638 Anemia in other chronic diseases classified elsewhere: Secondary | ICD-10-CM | POA: Diagnosis present

## 2014-10-06 DIAGNOSIS — Z7982 Long term (current) use of aspirin: Secondary | ICD-10-CM | POA: Diagnosis not present

## 2014-10-06 DIAGNOSIS — N184 Chronic kidney disease, stage 4 (severe): Secondary | ICD-10-CM | POA: Diagnosis present

## 2014-10-06 DIAGNOSIS — I200 Unstable angina: Secondary | ICD-10-CM

## 2014-10-06 DIAGNOSIS — I2510 Atherosclerotic heart disease of native coronary artery without angina pectoris: Secondary | ICD-10-CM | POA: Diagnosis present

## 2014-10-06 DIAGNOSIS — Z9861 Coronary angioplasty status: Secondary | ICD-10-CM

## 2014-10-06 DIAGNOSIS — Z8249 Family history of ischemic heart disease and other diseases of the circulatory system: Secondary | ICD-10-CM

## 2014-10-06 LAB — MRSA PCR SCREENING: MRSA by PCR: NEGATIVE

## 2014-10-06 LAB — TROPONIN I: Troponin I: <0.03 ng/mL (ref <0.031)

## 2014-10-06 LAB — CBC
HCT: 35.5 % — ABNORMAL LOW (ref 39.0–52.0)
HEMOGLOBIN: 11.8 g/dL — AB (ref 13.0–17.0)
MCH: 30.9 pg (ref 26.0–34.0)
MCHC: 33.2 g/dL (ref 30.0–36.0)
MCV: 92.9 fL (ref 78.0–100.0)
Platelets: 155 10*3/uL (ref 150–400)
RBC: 3.82 MIL/uL — AB (ref 4.22–5.81)
RDW: 14 % (ref 11.5–15.5)
WBC: 8.6 10*3/uL (ref 4.0–10.5)

## 2014-10-06 LAB — TSH: TSH: 1.818 u[IU]/mL (ref 0.350–4.500)

## 2014-10-06 LAB — HEPARIN LEVEL (UNFRACTIONATED): HEPARIN UNFRACTIONATED: 0.87 [IU]/mL — AB (ref 0.30–0.70)

## 2014-10-06 MED ORDER — POLYSACCHARIDE IRON COMPLEX 150 MG PO CAPS
150.0000 mg | ORAL_CAPSULE | Freq: Every day | ORAL | Status: DC
  Administered 2014-10-07 – 2014-10-11 (×5): 150 mg via ORAL
  Filled 2014-10-06 (×6): qty 1

## 2014-10-06 MED ORDER — HEPARIN (PORCINE) IN NACL 100-0.45 UNIT/ML-% IJ SOLN: 850.0000 [IU]/h | INTRAMUSCULAR | Status: DC

## 2014-10-06 MED ORDER — ALPRAZOLAM 0.25 MG PO TABS: 0.2500 mg | ORAL_TABLET | Freq: Two times a day (BID) | ORAL | Status: DC | PRN

## 2014-10-06 MED ORDER — NITROGLYCERIN 0.4 MG SL SUBL: 0.4000 mg | SUBLINGUAL_TABLET | SUBLINGUAL | Status: DC | PRN

## 2014-10-06 MED ORDER — MORPHINE SULFATE (PF) 2 MG/ML IV SOLN: 2.0000 mg | INTRAVENOUS | Status: DC | PRN

## 2014-10-06 MED ORDER — ASPIRIN EC 81 MG PO TBEC
81.0000 mg | DELAYED_RELEASE_TABLET | Freq: Every day | ORAL | Status: DC
  Administered 2014-10-07 – 2014-10-11 (×5): 81 mg via ORAL
  Filled 2014-10-06 (×5): qty 1

## 2014-10-06 MED ORDER — SODIUM CHLORIDE 0.9 % IJ SOLN
3.0000 mL | Freq: Two times a day (BID) | INTRAMUSCULAR | Status: DC
  Administered 2014-10-06 – 2014-10-08 (×5): 3 mL via INTRAVENOUS

## 2014-10-06 MED ORDER — SODIUM CHLORIDE 0.9 % IV SOLN: 250.0000 mL | INTRAVENOUS | Status: DC | PRN

## 2014-10-06 MED ORDER — AMLODIPINE BESYLATE 10 MG PO TABS
10.0000 mg | ORAL_TABLET | Freq: Every day | ORAL | Status: DC
  Administered 2014-10-06 – 2014-10-11 (×6): 10 mg via ORAL
  Filled 2014-10-06 (×6): qty 1

## 2014-10-06 MED ORDER — ATORVASTATIN CALCIUM 80 MG PO TABS
80.0000 mg | ORAL_TABLET | Freq: Every day | ORAL | Status: DC
  Administered 2014-10-06 – 2014-10-10 (×5): 80 mg via ORAL
  Filled 2014-10-06 (×5): qty 1

## 2014-10-06 MED ORDER — HYDRALAZINE HCL 20 MG/ML IJ SOLN
INTRAMUSCULAR | Status: AC
  Administered 2014-10-06: 10 mg via INTRAVENOUS
  Filled 2014-10-06: qty 1

## 2014-10-06 MED ORDER — NITROGLYCERIN IN D5W 200-5 MCG/ML-% IV SOLN: 2.0000 ug/min | INTRAVENOUS | Status: DC

## 2014-10-06 MED ORDER — VITAMIN D 1000 UNITS PO TABS
1000.0000 [IU] | ORAL_TABLET | Freq: Every day | ORAL | Status: DC
  Administered 2014-10-06 – 2014-10-11 (×6): 1000 [IU] via ORAL
  Filled 2014-10-06 (×6): qty 1

## 2014-10-06 MED ORDER — METOPROLOL TARTRATE 25 MG PO TABS
25.0000 mg | ORAL_TABLET | Freq: Two times a day (BID) | ORAL | Status: DC
  Administered 2014-10-06 – 2014-10-11 (×9): 25 mg via ORAL
  Filled 2014-10-06 (×9): qty 1

## 2014-10-06 MED ORDER — HYDRALAZINE HCL 20 MG/ML IJ SOLN
10.0000 mg | INTRAMUSCULAR | Status: DC | PRN
  Administered 2014-10-06 – 2014-10-10 (×2): 10 mg via INTRAVENOUS

## 2014-10-06 MED ORDER — ONDANSETRON HCL 4 MG/2ML IJ SOLN: 4.0000 mg | Freq: Four times a day (QID) | INTRAMUSCULAR | Status: DC | PRN

## 2014-10-06 MED ORDER — ACETAMINOPHEN 325 MG PO TABS
650.0000 mg | ORAL_TABLET | ORAL | Status: DC | PRN
  Administered 2014-10-07 – 2014-10-09 (×3): 650 mg via ORAL
  Filled 2014-10-06 (×3): qty 2

## 2014-10-06 MED ORDER — HYDRALAZINE HCL 50 MG PO TABS
50.0000 mg | ORAL_TABLET | Freq: Three times a day (TID) | ORAL | Status: DC
Start: 2014-10-06 — End: 2014-10-08
  Administered 2014-10-06 – 2014-10-08 (×5): 50 mg via ORAL
  Filled 2014-10-06 (×5): qty 1

## 2014-10-06 MED ORDER — ZOLPIDEM TARTRATE 5 MG PO TABS: 5.0000 mg | ORAL_TABLET | Freq: Every evening | ORAL | Status: DC | PRN

## 2014-10-06 MED ORDER — SODIUM CHLORIDE 0.9 % IJ SOLN: 3.0000 mL | INTRAMUSCULAR | Status: DC | PRN

## 2014-10-06 MED ORDER — HEPARIN (PORCINE) IN NACL 100-0.45 UNIT/ML-% IJ SOLN: 1000.0000 [IU]/h | INTRAMUSCULAR | Status: DC

## 2014-10-06 MED FILL — Heparin Sodium (Porcine) 100 Unt/ML in Sodium Chloride 0.45%: INTRAMUSCULAR | Qty: 250 | Status: AC

## 2014-10-06 MED FILL — Nitroglycerin IV Soln 200 MCG/ML in D5W: INTRAVENOUS | Qty: 250 | Status: AC

## 2014-10-06 NOTE — H&P (Signed)
History and Physical   Patient ID: Kevin [name] MRN: [id number], DOB/AGE: 1930/03/07 81 y.o. Date of Encounter: 10/06/2014  Primary Physician: Glenda Chroman., MD Primary Cardiologist: Dr Domenic Polite 06/28/2014  Chief Complaint:  Chest pain  HPI: Kevin [name] is a 81 y.o. male with a history of HTN, Tob use, CAD w/ DES LAD 2014 and preserved EF. Dr Woody Seller follows his lipids.   When Dr. Domenic Polite saw him 06/28/2014, he was at his usual level of activity and not having any anginal symptoms.  Last p.m., [name] developed right-sided chest pain he describes as a pressure squeezing sensation. It radiated to his right arm. It made him a little short of breath. His symptoms resolved without intervention and he went to bed. The same pain woke him in the middle of the night. This pain was more severe, reaching a 9/10. It was associated with shortness of breath and diaphoresis. He was concerned so he went to [facility].  Over the course of his treatment, he was given sublingual nitroglycerin 3, aspirin 81 mg 4, heparin, and morphine. The sublingual nitroglycerin helped a little bit, but the pain was finally relieved by morphine. He is now on IV nitroglycerin and heparin. The pain has not returned. He has never had this kind of pain before. The pain he had prior to his heart catheterization in January 2015 was substernal. It felt similar in character and quality, but was in a slightly different location.  Recently, his activity has been at baseline. 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 He denies any dyspnea on exertion or chest pain with exertion. He has not had lower extremity edema. 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 He has not had any bleeding issues or GI issues. He was concerned that he was getting a cold because he has been coughing a little bit, clear sputum. He has not been coughing a  great deal and has not had any fevers or chills.   Past Medical History  Diagnosis Date  . Essential hypertension, benign   . CKD (chronic kidney disease), stage IV   . Tobacco abuse   . Claudication     R leg with bilateral femoral bruits.  . Coronary atherosclerosis of native coronary artery     Cath 1/15 showed critical LAD stenosis, chronically occluded RCA, mod distal LCx dz s/p PTCA/DES to LAD   . Dyslipidemia   . Chronic anemia     Surgical History:  Past Surgical History  Procedure Laterality Date  . Bilateral carpal tunnel release    . Left heart catheterization with coronary angiogram N/A 02/09/2012    Procedure: LEFT HEART CATHETERIZATION WITH CORONARY ANGIOGRAM;  Surgeon: Peter M Martinique, MD; left main 10%, LAD 80%/95%, D1 patent, CFX 70% at OM 2, dCFX 90%, RCA 100% with L-R collaterals    . Percutaneous coronary stent intervention (pci-s) N/A 02/11/2012    Procedure: PERCUTANEOUS CORONARY STENT INTERVENTION (PCI-S);  Surgeon: Burnell Blanks, MD; 3.0 x 28 mm Promus Premier DES to the mid LAD      I have reviewed the patient's current medications. Medication Sig  amLODipine (NORVASC) 10 MG tablet Take 10 mg by mouth daily.  aspirin EC 81 MG tablet Take 81 mg by mouth daily.  atorvastatin (LIPITOR) 80 MG tablet TAKE ONE TABLET BY MOUTH AT BEDTIME  cholecalciferol (VITAMIN D) 1000 UNITS tablet Take  1,000 Units by mouth daily.  hydrALAZINE (APRESOLINE) 25 MG tablet Take 25 mg by mouth 2 (two) times daily.  hydrochlorothiazide (MICROZIDE) 12.5 MG capsule Take 1 capsule by mouth Daily.  iron polysaccharides (NIFEREX) 150 MG capsule Take 150 mg by mouth daily.  isosorbide mononitrate (IMDUR) 30 MG 24 hr tablet Take 1 tablet (30 mg total) by mouth daily.  lisinopril (PRINIVIL,ZESTRIL) 5 MG tablet Take 0.5 tablets by mouth daily.   metoprolol tartrate (LOPRESSOR) 25 MG tablet Take 1 tablet (25 mg total) by mouth 2 (two) times daily.  nitroGLYCERIN (NITROSTAT) 0.4 MG SL  tablet Place 1 tablet (0.4 mg total) under the tongue every 5 (five) minutes as needed for chest pain (up to 3 doses).  sodium bicarbonate 650 MG tablet Take 1 tablet (650 mg total) by mouth 2 (two) times daily.   Allergies: No Known Allergies  Social History   Social History  . Marital Status: Widowed    Spouse Name: N/A  . Number of Children: N/A  . Years of Education: N/A   Occupational History  . 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 Smoking status: Former Smoker -- 0.50 packs/day for 45 years    Types: Cigarettes    Start date: 01/28/1968    Quit date: 02/06/2012  . Smokeless tobacco: Never Used  . Alcohol Use: No  . Drug Use: No  . Sexual Activity: Not on file   Other Topics Concern  . Not on file   Social History Narrative   Lives in [place]    Family History  Problem Relation Age of Onset  . Hypertension     Family Status  Relation Status Death Age  . Mother Deceased   . Father Deceased     Review of Systems:   Full 14-point review of systems otherwise negative except as noted above.  Physical Exam: Blood pressure 188/84, pulse 48, resp. rate 15, SpO2 100 %. General: Well developed, well nourished,male in no acute distress. Head: Normocephalic, atraumatic, sclera non-icteric, no xanthomas, nares are without discharge. Dentition: Poor Neck: No carotid bruits. JVD not elevated. No thyromegally Lungs: Good expansion bilaterally. without wheezes, few rales and rhonchi.  Heart: Regular rate and rhythm with S1 S2.  No S3 or S4.  No murmur, no rubs, or gallops appreciated. Abdomen: Soft, non-tender, non-distended with normoactive bowel sounds. No hepatomegaly. No rebound/guarding. No obvious abdominal masses. Msk:  Strength and tone appear normal for age. No joint deformities or effusions, no spine or costo-vertebral angle tenderness. Extremities: No clubbing or cyanosis. No edema.  Distal pedal pulses are 2+ in  upper extrem, slightly decreased in lower extremities Neuro: Alert and oriented X 3. Moves all extremities spontaneously. No focal deficits noted. Psych:  Responds to questions appropriately with a normal affect. Skin: No rashes or lesions noted  Labs:   Lab Results  Component Value Date   WBC 7.9     HGB  12.2      HCT  37.5     MCV  92.4       platelets   164      Lab Results  Component Value Date    sodium 141     potassium   4.6      BUN 26     CR 1.53      Radiology/Studies: At [facility] 10/06/2014 No edema or consolidation, no acute disease    Cardiac Cath: 02/09/2012  Left mainstem: The left main coronary has mild irregularities less than 10%. Left anterior descending (LAD): The LAD has a 70-80% stenosis proximally followed by a 90-95% stenosis at the bifurcation with the second diagonal branch. The first diagonal branch is without significant disease. Left circumflex (LCx): The left circumflex is a large vessel. It has a 30% stenosis in the mid vessel. There is a 70% focal stenosis prior to the takeoff of the second marginal branch. The distal circumflex on the posterior lateral wall has a 90% stenosis. Right coronary artery (RCA): The right coronary arises and distributes normally. Has a 90% stenosis in the mid vessel and is then occluded at the crux. There are left to right collaterals to the distal RCA. Left ventriculography: Not performed Final Conclusions:  1. Severe three-vessel obstructive coronary disease  02/11/2012 Staged PCI with 3.0 x 28 mm Promus Premier DES to the mid LAD  Echo: 02/10/2012 - Left ventricle: The cavity size was normal. Wall thickness was normal. Systolic function was normal. The estimated ejection fraction was in the range of 50% to 55%. Wall motion was normal; there were no regional wall motion abnormalities. Doppler parameters are consistent with abnormal left ventricular relaxation (grade 1  diastolic dysfunction). - Left atrium: The atrium was mildly dilated.  ECG: None 9 2016 Sinus bradycardia, rate 57 Lateral T-wave changes and inferior T-wave changes have improved from 06/2014  ASSESSMENT AND PLAN:  Principal Problem:   Unstable angina pectoris - Continue IV heparin and nitroglycerin - Continue home doses of aspirin, beta blocker and high-dose statin. - New, resting chest pain with significant known CAD. - M.D. advise on cath  Active Problems:   CKD (chronic kidney disease) stage 4, GFR 15-29 ml/min - Currently his GFR is greater than 30, therefore stage III. - Hold his Microzide and ACE inhibitor    Tobacco abuse - Cessation advised    Essential hypertension, benign - For now, hold Microzide and lisinopril. - She will need better blood pressure control but he is bradycardic so no beta blocker dose change. We will increase his hydralazine as needed.    Hyperlipidemia - Check LFTs and lipid profile in a.m.    Anemia of chronic disease - Mild anemia, follow-up with primary M.D.  Jonetta Speak, PA-C 10/06/2014 4:12 PM Beeper [phone]    Agree with note by Rosaria Ferries PA-C  Pt with known CAD, cont tob abuse, HTN and renal disease admitted with 2 episodes of CP C/W [place]. Stent LAD 02/09/12. T RCA with LCX Dz as well Rx medically. Currently pain free. Exam benign. EKG w/o acute changes. Labs OK  (SCr 1.53). Exam benign. On IV Hep /NTG. Plan cor angio Monday. Discussed with Pt and daughter.    Lorretta Harp, M.D., [place], [facility], [place] [address] [place], [place]  [address]  [phone] 10/06/2014 4:35 PM

## 2014-10-06 NOTE — Progress Notes (Signed)
ANTICOAGULATION CONSULT NOTE - Initial Consult  Pharmacy Consult for Heparin  Indication: Unstable angina pectoris. ACS/STEMI  No Known Allergies  Patient Measurements: Height: 5\' 6"  (167.6 cm) Weight: 190 lb 7.6 oz (86.4 kg) (bedscale) IBW/kg (Calculated) : 63.8 Heparin Dosing Weight: 81.7 kg   Vital Signs: Temp: 97.7 F (36.5 C) (09/09 1600) Temp Source: Oral (09/09 1600) BP: 178/76 mmHg (09/09 1600) Pulse Rate: 49 (09/09 1600)  Labs:  [facility] hospital admission labs on 10/06/14 @ 0845 Hgb 12.2 Hct 37.5 PLTC 164K  PT =9.4 sec INR 0.9 APTT = 25 sec D-Dimer 1.16 (H) SCr = 1.53 (H) BUN= 26 (H) WBC = 7.9K ALT/AST wnl No results for input(s): HGB, HCT, PLT, APTT, LABPROT, INR, HEPARINUNFRC, CREATININE, CKTOTAL, CKMB, TROPONINI in the last 72 hours.  CrCl cannot be calculated (Patient has no serum creatinine result on file.).   Medical History: Past Medical History  Diagnosis Date  . Essential hypertension, benign   . CKD (chronic kidney disease), stage IV   . Tobacco abuse   . Claudication     R leg with bilateral femoral bruits.  . Coronary atherosclerosis of native coronary artery     Cath 1/15 showed critical LAD stenosis, chronically occluded RCA, mod distal LCx dz s/p PTCA/DES to LAD   . Dyslipidemia   . Chronic anemia     Medications:  Prescriptions prior to admission  Medication Sig Dispense Refill Last Dose  . amLODipine (NORVASC) 10 MG tablet Take 10 mg by mouth daily.   10/05/2014 at Unknown time  . aspirin EC 81 MG tablet Take 81 mg by mouth daily.   10/06/2014 at Unknown time  . atorvastatin (LIPITOR) 80 MG tablet TAKE ONE TABLET BY MOUTH AT BEDTIME 30 tablet 6 10/05/2014 at Unknown time  . cholecalciferol (VITAMIN D) 1000 UNITS tablet Take 1,000 Units by mouth daily.   10/05/2014 at Unknown time  . gemfibrozil (LOPID) 600 MG tablet Take 600 mg by mouth 2 (two) times daily before a meal.   10/05/2014 at Unknown time  . hydrALAZINE (APRESOLINE) 25 MG tablet  Take 25 mg by mouth 2 (two) times daily.   10/05/2014 at Unknown time  . hydrochlorothiazide (MICROZIDE) 12.5 MG capsule Take 1 capsule by mouth Daily.   10/05/2014 at Unknown time  . iron polysaccharides (NIFEREX) 150 MG capsule Take 150 mg by mouth daily.   10/05/2014 at Unknown time  . isosorbide mononitrate (IMDUR) 30 MG 24 hr tablet Take 1 tablet (30 mg total) by mouth daily. 30 tablet 6 10/05/2014 at Unknown time  . lisinopril (PRINIVIL,ZESTRIL) 5 MG tablet Take 0.5 tablets by mouth daily.    10/05/2014 at Unknown time  . metoprolol tartrate (LOPRESSOR) 25 MG tablet Take 1 tablet (25 mg total) by mouth 2 (two) times daily. 60 tablet 6 10/05/2014 at 0800  . nitroGLYCERIN (NITROSTAT) 0.4 MG SL tablet Place 1 tablet (0.4 mg total) under the tongue every 5 (five) minutes as needed for chest pain (up to 3 doses). 25 tablet 3 10/06/2014 at Unknown time  . sodium bicarbonate 650 MG tablet Take 1 tablet (650 mg total) by mouth 2 (two) times daily. 60 tablet 0 10/05/2014 at Unknown time    Assessment: 81 y.o male  with a history of HTN, CKD, Tob use, CAD w/ DES LAD 2014 and preserved EF, HLD, s/p cardiac cath 01/2014.  The patient went to [facility] on 10/06/14 early AM and now transfered to [facility]. Currently on IV heparin drip at 1000 units/hr.  Heparin started at [facility] at 10:40 AM with 4000 units bolus x1.   Goal of Therapy:  Heparin level 0.3-0.7 units/ml Monitor platelets by anticoagulation protocol: Yes   Plan:  Continue current heparin IV infusion at current rate 1000 units/hr. Check heparin level & CBC now with admit labs ordered (will be ~6hr heparin level) Daily heparin level and CBC  Thank you for allowing pharmacy to be part of this patients care team. Nicole Cella, RPh Clinical Pharmacist Pager: [phone] 10/06/2014,4:43 PM

## 2014-10-06 NOTE — Progress Notes (Addendum)
ANTICOAGULATION CONSULT NOTE -  Follow up  Pharmacy Consult for Heparin  Indication: Unstable angina pectoris. ACS/STEMI  No Known Allergies  Patient Measurements: Height: 5\' 6"  (167.6 cm) Weight: 190 lb 7.6 oz (86.4 kg) (bedscale) IBW/kg (Calculated) : 63.8 Heparin Dosing Weight: 81.7 kg   Vital Signs: Temp: 97.7 F (36.5 C) (09/09 1600) Temp Source: Oral (09/09 1600) BP: 169/63 mmHg (09/09 1714) Pulse Rate: 49 (09/09 1600)  Labs:  [facility] hospital admission labs on 10/06/14 @ 0845 Hgb 12.2 Hct 37.5 PLTC 164K  PT =9.4 sec INR 0.9 APTT = 25 sec D-Dimer 1.16 (H) SCr = 1.53 (H) BUN= 26 (H) WBC = 7.9K ALT/AST wnl  Recent Labs  10/06/14 1700  HGB 11.8*  HCT 35.5*  PLT 155  HEPARINUNFRC 0.87*    CrCl cannot be calculated (Patient has no serum creatinine result on file.).   Medical History: Past Medical History  Diagnosis Date  . Essential hypertension, benign   . CKD (chronic kidney disease), stage IV   . Tobacco abuse   . Claudication     R leg with bilateral femoral bruits.  . Coronary atherosclerosis of native coronary artery     Cath 1/15 showed critical LAD stenosis, chronically occluded RCA, mod distal LCx dz s/p PTCA/DES to LAD   . Dyslipidemia   . Chronic anemia     Medications:  Prescriptions prior to admission  Medication Sig Dispense Refill Last Dose  . amLODipine (NORVASC) 10 MG tablet Take 10 mg by mouth daily.   10/05/2014 at Unknown time  . aspirin EC 81 MG tablet Take 81 mg by mouth daily.   10/06/2014 at Unknown time  . atorvastatin (LIPITOR) 80 MG tablet TAKE ONE TABLET BY MOUTH AT BEDTIME 30 tablet 6 10/05/2014 at Unknown time  . cholecalciferol (VITAMIN D) 1000 UNITS tablet Take 1,000 Units by mouth daily.   10/05/2014 at Unknown time  . gemfibrozil (LOPID) 600 MG tablet Take 600 mg by mouth 2 (two) times daily before a meal.   10/05/2014 at Unknown time  . hydrALAZINE (APRESOLINE) 25 MG tablet Take 25 mg by mouth 2 (two) times daily.   10/05/2014  at Unknown time  . hydrochlorothiazide (MICROZIDE) 12.5 MG capsule Take 1 capsule by mouth Daily.   10/05/2014 at Unknown time  . iron polysaccharides (NIFEREX) 150 MG capsule Take 150 mg by mouth daily.   10/05/2014 at Unknown time  . isosorbide mononitrate (IMDUR) 30 MG 24 hr tablet Take 1 tablet (30 mg total) by mouth daily. 30 tablet 6 10/05/2014 at Unknown time  . lisinopril (PRINIVIL,ZESTRIL) 5 MG tablet Take 0.5 tablets by mouth daily.    10/05/2014 at Unknown time  . metoprolol tartrate (LOPRESSOR) 25 MG tablet Take 1 tablet (25 mg total) by mouth 2 (two) times daily. 60 tablet 6 10/05/2014 at 0800  . nitroGLYCERIN (NITROSTAT) 0.4 MG SL tablet Place 1 tablet (0.4 mg total) under the tongue every 5 (five) minutes as needed for chest pain (up to 3 doses). 25 tablet 3 10/06/2014 at Unknown time  . sodium bicarbonate 650 MG tablet Take 1 tablet (650 mg total) by mouth 2 (two) times daily. 60 tablet 0 10/05/2014 at Unknown time    Assessment: The initial 6 hour heparin level is 0.87, above goal of 0.3-0.7 in this 81 y.o male on IV heparin drip1000 units/hr for unstable angina/ACS/STEMI.  No bleeding reported.  CBC with slight decrease to H/H 11.8/35.5 and pltc 155K.  Goal of Therapy:  Heparin level 0.3-0.7 units/ml  Monitor platelets by anticoagulation protocol: Yes   Plan:  Decrease heparin IV infusion to 850 units/hr. Check 6 hr heparin level  Daily heparin level and CBC  Thank you for allowing pharmacy to be part of this patients care team. Nicole Cella, RPh Clinical Pharmacist Pager: [phone] 10/06/2014,5:50 PM  Addendum: Heparin level = 0.6, at goal on 850 units/hr, continue at current rate, will f/u AM labs.  Maryanna Shape, PharmD, BCPS  Clinical Pharmacist  Pager: [phone]

## 2014-10-07 ENCOUNTER — Inpatient Hospital Stay (HOSPITAL_COMMUNITY): Payer: Medicare Other

## 2014-10-07 DIAGNOSIS — R079 Chest pain, unspecified: Secondary | ICD-10-CM

## 2014-10-07 DIAGNOSIS — E785 Hyperlipidemia, unspecified: Secondary | ICD-10-CM

## 2014-10-07 DIAGNOSIS — N184 Chronic kidney disease, stage 4 (severe): Secondary | ICD-10-CM

## 2014-10-07 LAB — LIPID PANEL
Cholesterol: 156 mg/dL (ref 0–200)
HDL: 38 mg/dL — AB (ref >40)
LDL CALC: 107 mg/dL — AB (ref 0–99)
Total CHOL/HDL Ratio: 4.1 RATIO
Triglycerides: 56 mg/dL (ref <150)
VLDL: 11 mg/dL (ref 0–40)

## 2014-10-07 LAB — COMPREHENSIVE METABOLIC PANEL
ALT: 15 U/L — ABNORMAL LOW (ref 17–63)
ANION GAP: 7 (ref 5–15)
AST: 17 U/L (ref 15–41)
Albumin: 3.3 g/dL — ABNORMAL LOW (ref 3.5–5.0)
Alkaline Phosphatase: 64 U/L (ref 38–126)
BUN: 23 mg/dL — ABNORMAL HIGH (ref 6–20)
CHLORIDE: 110 mmol/L (ref 101–111)
CO2: 19 mmol/L — ABNORMAL LOW (ref 22–32)
CREATININE: 1.6 mg/dL — AB (ref 0.61–1.24)
Calcium: 8.6 mg/dL — ABNORMAL LOW (ref 8.9–10.3)
GFR, EST AFRICAN AMERICAN: 45 mL/min — AB (ref >60)
GFR, EST NON AFRICAN AMERICAN: 38 mL/min — AB (ref >60)
Glucose, Bld: 97 mg/dL (ref 65–99)
POTASSIUM: 5.4 mmol/L — AB (ref 3.5–5.1)
Sodium: 136 mmol/L (ref 135–145)
Total Bilirubin: 0.4 mg/dL (ref 0.3–1.2)
Total Protein: 6.3 g/dL — ABNORMAL LOW (ref 6.5–8.1)

## 2014-10-07 LAB — HEPARIN LEVEL (UNFRACTIONATED)
HEPARIN UNFRACTIONATED: 0.75 [IU]/mL — AB (ref 0.30–0.70)
HEPARIN UNFRACTIONATED: 0.38 [IU]/mL (ref 0.30–0.70)
HEPARIN UNFRACTIONATED: 0.6 [IU]/mL (ref 0.30–0.70)

## 2014-10-07 LAB — TROPONIN I

## 2014-10-07 LAB — CBC
HCT: 36.1 % — ABNORMAL LOW (ref 39.0–52.0)
Hemoglobin: 11.8 g/dL — ABNORMAL LOW (ref 13.0–17.0)
MCH: 30.9 pg (ref 26.0–34.0)
MCHC: 32.7 g/dL (ref 30.0–36.0)
MCV: 94.5 fL (ref 78.0–100.0)
PLATELETS: 160 10*3/uL (ref 150–400)
RBC: 3.82 MIL/uL — ABNORMAL LOW (ref 4.22–5.81)
RDW: 14.3 % (ref 11.5–15.5)
WBC: 8.7 10*3/uL (ref 4.0–10.5)

## 2014-10-07 LAB — HEMOGLOBIN A1C
HEMOGLOBIN A1C: 5.9 % — AB (ref 4.8–5.6)
MEAN PLASMA GLUCOSE: 123 mg/dL

## 2014-10-07 MED ORDER — ISOSORBIDE MONONITRATE ER 30 MG PO TB24
30.0000 mg | ORAL_TABLET | Freq: Every day | ORAL | Status: DC
  Administered 2014-10-08 – 2014-10-10 (×3): 30 mg via ORAL
  Filled 2014-10-07 (×3): qty 1

## 2014-10-07 MED ORDER — HEPARIN (PORCINE) IN NACL 100-0.45 UNIT/ML-% IJ SOLN
1000.0000 [IU]/h | INTRAMUSCULAR | Status: DC
  Administered 2014-10-07: 750 [IU]/h via INTRAVENOUS
  Filled 2014-10-07 (×2): qty 250

## 2014-10-07 NOTE — Progress Notes (Signed)
  Echocardiogram 2D Echocardiogram has been performed.  Kevin [name] 10/07/2014, 3:32 PM

## 2014-10-07 NOTE — Progress Notes (Signed)
Utilization Review Completed.Donne Anon T9/10/2014

## 2014-10-07 NOTE — Progress Notes (Signed)
Arrived in the unit via wheelchair with RN at 16:48. Patient's stable. Denies any pain. No distress noted. Patient's spouse at bedside.

## 2014-10-07 NOTE — Progress Notes (Signed)
ANTICOAGULATION CONSULT NOTE -  Follow up  Pharmacy Consult for Heparin  Indication: Unstable angina pectoris. ACS/STEMI  No Known Allergies  Patient Measurements: Height: 5\' 6"  (167.6 cm) Weight: 190 lb 0.6 oz (86.2 kg) IBW/kg (Calculated) : 63.8 Heparin Dosing Weight: 81.7 kg   Vital Signs: Temp: 97.6 F (36.4 C) (09/10 0751) Temp Source: Oral (09/10 0340) BP: 165/57 mmHg (09/10 0800) Pulse Rate: 50 (09/10 0800)  Labs:  [facility] hospital admission labs on 10/06/14 @ 0845 Hgb 12.2 Hct 37.5 PLTC 164K  PT =9.4 sec INR 0.9 APTT = 25 sec D-Dimer 1.16 (H) SCr = 1.53 (H) BUN= 26 (H) WBC = 7.9K ALT/AST wnl  Recent Labs  10/06/14 1700 10/06/14 2216 10/07/14 0022 10/07/14 0422 10/07/14 0820  HGB 11.8*  --   --  11.8*  --   HCT 35.5*  --   --  36.1*  --   PLT 155  --   --  160  --   HEPARINUNFRC 0.87*  --  0.60  --  0.75*  CREATININE  --   --   --  1.60*  --   TROPONINI <0.03 <0.03  --  <0.03  --     Estimated Creatinine Clearance: 36.7 mL/min (by C-G formula based on Cr of 1.6).   Medical History: Past Medical History  Diagnosis Date  . Essential hypertension, benign   . CKD (chronic kidney disease), stage IV   . Tobacco abuse   . Claudication     R leg with bilateral femoral bruits.  . Coronary atherosclerosis of native coronary artery     Cath 1/15 showed critical LAD stenosis, chronically occluded RCA, mod distal LCx dz s/p PTCA/DES to LAD   . Dyslipidemia   . Chronic anemia     Medications:  Prescriptions prior to admission  Medication Sig Dispense Refill Last Dose  . amLODipine (NORVASC) 10 MG tablet Take 10 mg by mouth daily.   10/05/2014 at Unknown time  . aspirin EC 81 MG tablet Take 81 mg by mouth daily.   10/06/2014 at Unknown time  . atorvastatin (LIPITOR) 80 MG tablet TAKE ONE TABLET BY MOUTH AT BEDTIME 30 tablet 6 10/05/2014 at Unknown time  . cholecalciferol (VITAMIN D) 1000 UNITS tablet Take 1,000 Units by mouth daily.   10/05/2014 at Unknown time   . gemfibrozil (LOPID) 600 MG tablet Take 600 mg by mouth 2 (two) times daily before a meal.   10/05/2014 at Unknown time  . hydrALAZINE (APRESOLINE) 25 MG tablet Take 25 mg by mouth 2 (two) times daily.   10/05/2014 at Unknown time  . hydrochlorothiazide (MICROZIDE) 12.5 MG capsule Take 1 capsule by mouth Daily.   10/05/2014 at Unknown time  . iron polysaccharides (NIFEREX) 150 MG capsule Take 150 mg by mouth daily.   10/05/2014 at Unknown time  . isosorbide mononitrate (IMDUR) 30 MG 24 hr tablet Take 1 tablet (30 mg total) by mouth daily. 30 tablet 6 10/05/2014 at Unknown time  . lisinopril (PRINIVIL,ZESTRIL) 5 MG tablet Take 0.5 tablets by mouth daily.    10/05/2014 at Unknown time  . metoprolol tartrate (LOPRESSOR) 25 MG tablet Take 1 tablet (25 mg total) by mouth 2 (two) times daily. 60 tablet 6 10/05/2014 at 0800  . nitroGLYCERIN (NITROSTAT) 0.4 MG SL tablet Place 1 tablet (0.4 mg total) under the tongue every 5 (five) minutes as needed for chest pain (up to 3 doses). 25 tablet 3 10/06/2014 at Unknown time  . sodium bicarbonate 650 MG tablet  Take 1 tablet (650 mg total) by mouth 2 (two) times daily. 60 tablet 0 10/05/2014 at Unknown time    Assessment: 81 y.o male admitted 10/06/2014 from [facility] for CP, SOB, diaphoresis. Received SL NTG x 3, ASA 81 x 4, morphine. Heparin gtt started at [facility] with 4000 units bolus x 1. Pharmacy consulted to dose heparin.   PMH HTN, CKD stage IV, tobacco use, CAD s/p DES x 1 to LAD (2014), HLD  CBC stable, HL 0.75 supratherapeutic on heparin 850 units/h.  Goal of Therapy:  Heparin level 0.3-0.7 units/ml Monitor platelets by anticoagulation protocol: Yes   Plan:  Decrease heparin to 750 units/hr. Check 8 hr heparin level  Daily heparin level and CBC Cath planned Monday Monitor s/sx bleeding   Heloise Ochoa, Pharm.D. PGY2 Cardiology Pharmacy Resident Pager: [phone] 10:56 AM 10/07/14

## 2014-10-07 NOTE — Progress Notes (Signed)
SUBJECTIVE: The patient is doing well today.  At this time, he denies chest pain, shortness of breath, or any new concerns.  Marland Kitchen amLODipine  10 mg Oral Daily  . aspirin EC  81 mg Oral Daily  . atorvastatin  80 mg Oral QHS  . cholecalciferol  1,000 Units Oral Daily  . hydrALAZINE  50 mg Oral TID  . iron polysaccharides  150 mg Oral Daily  . isosorbide mononitrate  30 mg Oral Daily  . metoprolol tartrate  25 mg Oral BID  . sodium chloride  3 mL Intravenous Q12H   . heparin 750 Units/hr (10/07/14 1100)  . nitroGLYCERIN Stopped (10/07/14 0100)    OBJECTIVE: Physical Exam: Filed Vitals:   10/07/14 0900 10/07/14 1000 10/07/14 1100 10/07/14 1206  BP: 139/40 145/47 163/59   Pulse: 62 56 54   Temp:    97.5 F (36.4 C)  TempSrc:      Resp: 19 13 19    Height:      Weight:      SpO2: 100% 100% 100%     Intake/Output Summary (Last 24 hours) at 10/07/14 1336 Last data filed at 10/07/14 1100  Gross per 24 hour  Intake 940.73 ml  Output    525 ml  Net 415.73 ml    Telemetry reveals sinus rhythm with occasional pvcs, short NSVT  GEN- The patient is well appearing, alert and oriented x 3 today.   Head- normocephalic, atraumatic Eyes-  Sclera clear, conjunctiva pink Ears- hearing intact Oropharynx- clear Neck- supple,   Lungs- Clear to ausculation bilaterally, normal work of breathing Heart- Regular rate and rhythm, no murmurs, rubs or gallops, PMI not laterally displaced GI- soft, NT, ND, + BS Extremities- no clubbing, cyanosis, or edema Skin- no rash or lesion Psych- euthymic mood, full affect Neuro- strength and sensation are intact  LABS: Basic Metabolic Panel:  Recent Labs  10/07/14 0422  NA 136  K 5.4*  CL 110  CO2 19*  GLUCOSE 97  BUN 23*  CREATININE 1.60*  CALCIUM 8.6*   Liver Function Tests:  Recent Labs  10/07/14 0422  AST 17  ALT 15*  ALKPHOS 64  BILITOT 0.4  PROT 6.3*  ALBUMIN 3.3*   No results for input(s): LIPASE, AMYLASE in the last 72  hours. CBC:  Recent Labs  10/06/14 1700 10/07/14 0422  WBC 8.6 8.7  HGB 11.8* 11.8*  HCT 35.5* 36.1*  MCV 92.9 94.5  PLT 155 160   Cardiac Enzymes:  Recent Labs  10/06/14 1700 10/06/14 2216 10/07/14 0422  TROPONINI <0.03 <0.03 <0.03   BNP: Invalid input(s): POCBNP D-Dimer: No results for input(s): DDIMER in the last 72 hours. Hemoglobin A1C:  Recent Labs  10/06/14 1700  HGBA1C 5.9*   Fasting Lipid Panel:  Recent Labs  10/07/14 0422  CHOL 156  HDL 38*  LDLCALC 107*  TRIG 56  CHOLHDL 4.1   Thyroid Function Tests:  Recent Labs  10/06/14 1700  TSH 1.818   Anemia Panel: No results for input(s): VITAMINB12, FOLATE, FERRITIN, TIBC, IRON, RETICCTPCT in the last 72 hours.  RADIOLOGY: No results found.  ASSESSMENT AND PLAN:   1. Unstable angina pectoris - Continue IV heparin -Transition nitro to imdur - Continue home doses of aspirin, beta blocker and high-dose statin. - plan for cath on Monday  2.  CKD (chronic kidney disease) stage 4, GFR 15-29 ml/min - Hold his Microzide and ACE inhibitor and follow Consider gentle hydration prior to cath  3. Tobacco abuse -  Cessation advised  4. Essential hypertension, benign - Hold Microzide and lisinopril.  5. Hyperlipidemia On high dose statin  6. Anemia of chronic disease Stable No change required today  Transfer to telemetry  Thompson Grayer, MD 10/07/2014 1:36 PM

## 2014-10-07 NOTE — Progress Notes (Signed)
ANTICOAGULATION CONSULT NOTE -  Follow up  Pharmacy Consult for Heparin  Indication: Unstable angina pectoris. ACS/STEMI  No Known Allergies  Patient Measurements: Height: 5\' 6"  (167.6 cm) Weight: 191 lb 12.8 oz (87 kg) (Scale C) IBW/kg (Calculated) : 63.8 Heparin Dosing Weight: 81.7 kg   Vital Signs: Temp: 98 F (36.7 C) (09/10 2018) Temp Source: Oral (09/10 2018) BP: 151/65 mmHg (09/10 2018) Pulse Rate: 91 (09/10 2018)  Labs:  [facility] hospital admission labs on 10/06/14 @ 0845 Hgb 12.2 Hct 37.5 PLTC 164K  PT =9.4 sec INR 0.9 APTT = 25 sec D-Dimer 1.16 (H) SCr = 1.53 (H) BUN= 26 (H) WBC = 7.9K ALT/AST wnl  Recent Labs  10/06/14 1700 10/06/14 2216 10/07/14 0022 10/07/14 0422 10/07/14 0820 10/07/14 1930  HGB 11.8*  --   --  11.8*  --   --   HCT 35.5*  --   --  36.1*  --   --   PLT 155  --   --  160  --   --   HEPARINUNFRC 0.87*  --  0.60  --  0.75* 0.38  CREATININE  --   --   --  1.60*  --   --   TROPONINI <0.03 <0.03  --  <0.03  --   --     Estimated Creatinine Clearance: 36.8 mL/min (by C-G formula based on Cr of 1.6).   Medical History: Past Medical History  Diagnosis Date  . Essential hypertension, benign   . CKD (chronic kidney disease), stage IV   . Tobacco abuse   . Claudication     R leg with bilateral femoral bruits.  . Coronary atherosclerosis of native coronary artery     Cath 1/15 showed critical LAD stenosis, chronically occluded RCA, mod distal LCx dz s/p PTCA/DES to LAD   . Dyslipidemia   . Chronic anemia     Medications:  Prescriptions prior to admission  Medication Sig Dispense Refill Last Dose  . amLODipine (NORVASC) 10 MG tablet Take 10 mg by mouth daily.   10/05/2014 at Unknown time  . aspirin EC 81 MG tablet Take 81 mg by mouth daily.   10/06/2014 at Unknown time  . atorvastatin (LIPITOR) 80 MG tablet TAKE ONE TABLET BY MOUTH AT BEDTIME 30 tablet 6 10/05/2014 at Unknown time  . cholecalciferol (VITAMIN D) 1000 UNITS tablet Take  1,000 Units by mouth daily.   10/05/2014 at Unknown time  . gemfibrozil (LOPID) 600 MG tablet Take 600 mg by mouth 2 (two) times daily before a meal.   10/05/2014 at Unknown time  . hydrALAZINE (APRESOLINE) 25 MG tablet Take 25 mg by mouth 2 (two) times daily.   10/05/2014 at Unknown time  . hydrochlorothiazide (MICROZIDE) 12.5 MG capsule Take 1 capsule by mouth Daily.   10/05/2014 at Unknown time  . iron polysaccharides (NIFEREX) 150 MG capsule Take 150 mg by mouth daily.   10/05/2014 at Unknown time  . isosorbide mononitrate (IMDUR) 30 MG 24 hr tablet Take 1 tablet (30 mg total) by mouth daily. 30 tablet 6 10/05/2014 at Unknown time  . lisinopril (PRINIVIL,ZESTRIL) 5 MG tablet Take 0.5 tablets by mouth daily.    10/05/2014 at Unknown time  . metoprolol tartrate (LOPRESSOR) 25 MG tablet Take 1 tablet (25 mg total) by mouth 2 (two) times daily. 60 tablet 6 10/05/2014 at 0800  . nitroGLYCERIN (NITROSTAT) 0.4 MG SL tablet Place 1 tablet (0.4 mg total) under the tongue every 5 (five) minutes as needed for  chest pain (up to 3 doses). 25 tablet 3 10/06/2014 at Unknown time  . sodium bicarbonate 650 MG tablet Take 1 tablet (650 mg total) by mouth 2 (two) times daily. 60 tablet 0 10/05/2014 at Unknown time    Assessment: 81 y.o male admitted 10/06/2014 from [place] for CP, SOB, diaphoresis. Received SL NTG x 3, ASA 81 x 4, morphine. Heparin gtt started at [facility] with 4000 units bolus x 1. Pharmacy consulted to dose heparin.   PMH HTN, CKD stage IV, tobacco use, CAD s/p DES x 1 to LAD (2014), HLD  Heparin level now therapeutic  Goal of Therapy:  Heparin level 0.3-0.7 units/ml Monitor platelets by anticoagulation protocol: Yes   Plan:  Continue heparin at 750 units / hr Daily heparin level and CBC Cath planned Monday Monitor s/sx bleeding   Thank you Anette Guarneri, PharmD [phone]  8:31 PM 10/07/14

## 2014-10-08 LAB — BASIC METABOLIC PANEL
Anion gap: 8 (ref 5–15)
BUN: 28 mg/dL — ABNORMAL HIGH (ref 6–20)
CALCIUM: 9.3 mg/dL (ref 8.9–10.3)
CO2: 20 mmol/L — AB (ref 22–32)
CREATININE: 1.76 mg/dL — AB (ref 0.61–1.24)
Chloride: 111 mmol/L (ref 101–111)
GFR calc Af Amer: 40 mL/min — ABNORMAL LOW (ref >60)
GFR calc non Af Amer: 34 mL/min — ABNORMAL LOW (ref >60)
GLUCOSE: 107 mg/dL — AB (ref 65–99)
Potassium: 4.9 mmol/L (ref 3.5–5.1)
Sodium: 139 mmol/L (ref 135–145)

## 2014-10-08 LAB — CBC
HCT: 37.7 % — ABNORMAL LOW (ref 39.0–52.0)
Hemoglobin: 12.1 g/dL — ABNORMAL LOW (ref 13.0–17.0)
MCH: 30.3 pg (ref 26.0–34.0)
MCHC: 32.1 g/dL (ref 30.0–36.0)
MCV: 94.3 fL (ref 78.0–100.0)
PLATELETS: 172 10*3/uL (ref 150–400)
RBC: 4 MIL/uL — ABNORMAL LOW (ref 4.22–5.81)
RDW: 14.3 % (ref 11.5–15.5)
WBC: 8 10*3/uL (ref 4.0–10.5)

## 2014-10-08 LAB — HEPARIN LEVEL (UNFRACTIONATED): HEPARIN UNFRACTIONATED: 0.29 [IU]/mL — AB (ref 0.30–0.70)

## 2014-10-08 MED ORDER — SODIUM CHLORIDE 0.9 % IV SOLN: 250.0000 mL | INTRAVENOUS | Status: DC | PRN

## 2014-10-08 MED ORDER — SODIUM CHLORIDE 0.9 % IJ SOLN
3.0000 mL | Freq: Two times a day (BID) | INTRAMUSCULAR | Status: DC
  Administered 2014-10-09: 3 mL via INTRAVENOUS

## 2014-10-08 MED ORDER — ASPIRIN 81 MG PO CHEW: 81.0000 mg | CHEWABLE_TABLET | ORAL | Status: DC

## 2014-10-08 MED ORDER — HYDRALAZINE HCL 50 MG PO TABS
100.0000 mg | ORAL_TABLET | Freq: Three times a day (TID) | ORAL | Status: DC
  Administered 2014-10-08 – 2014-10-11 (×8): 100 mg via ORAL
  Filled 2014-10-08 (×8): qty 2

## 2014-10-08 MED ORDER — SODIUM CHLORIDE 0.9 % IJ SOLN: 3.0000 mL | INTRAMUSCULAR | Status: DC | PRN

## 2014-10-08 MED ORDER — SODIUM CHLORIDE 0.9 % IV SOLN
INTRAVENOUS | Status: DC
  Administered 2014-10-08: 11:00:00 via INTRAVENOUS

## 2014-10-08 MED ORDER — SODIUM CHLORIDE 0.9 % IJ SOLN
3.0000 mL | Freq: Two times a day (BID) | INTRAMUSCULAR | Status: DC
  Administered 2014-10-09 – 2014-10-10 (×3): 3 mL via INTRAVENOUS

## 2014-10-08 MED ORDER — ASPIRIN 81 MG PO CHEW
81.0000 mg | CHEWABLE_TABLET | ORAL | Status: AC
  Administered 2014-10-09: 81 mg via ORAL
  Filled 2014-10-08: qty 1

## 2014-10-08 MED ORDER — SODIUM CHLORIDE 0.9 % WEIGHT BASED INFUSION
3.0000 mL/kg/h | INTRAVENOUS | Status: DC
  Administered 2014-10-09: 3 mL/kg/h via INTRAVENOUS

## 2014-10-08 MED ORDER — SODIUM CHLORIDE 0.9 % IV SOLN
INTRAVENOUS | Status: DC
  Administered 2014-10-09: 07:00:00 via INTRAVENOUS

## 2014-10-08 MED ORDER — SODIUM CHLORIDE 0.9 % WEIGHT BASED INFUSION: 1.0000 mL/kg/h | INTRAVENOUS | Status: DC

## 2014-10-08 NOTE — Progress Notes (Signed)
SUBJECTIVE: The patient is doing well today.  At this time, he denies chest pain, shortness of breath, or any new concerns.  He has only been walking in the room.  Marland Kitchen amLODipine  10 mg Oral Daily  . aspirin EC  81 mg Oral Daily  . atorvastatin  80 mg Oral QHS  . cholecalciferol  1,000 Units Oral Daily  . hydrALAZINE  50 mg Oral TID  . iron polysaccharides  150 mg Oral Daily  . isosorbide mononitrate  30 mg Oral Daily  . metoprolol tartrate  25 mg Oral BID  . sodium chloride  3 mL Intravenous Q12H   . heparin 750 Units/hr (10/07/14 1854)  . nitroGLYCERIN Stopped (10/07/14 0100)    OBJECTIVE: Physical Exam: Filed Vitals:   10/07/14 1648 10/07/14 1900 10/07/14 2018 10/08/14 0428  BP: 166/54  151/65 137/50  Pulse: 106  91 88  Temp: 98 F (36.7 C)  98 F (36.7 C) 98 F (36.7 C)  TempSrc: Oral Oral Oral Oral  Resp: 20  18 18   Height: 5\' 6"  (1.676 m)     Weight: 87 kg (191 lb 12.8 oz)   86.274 kg (190 lb 3.2 oz)  SpO2: 100%  98% 98%    Intake/Output Summary (Last 24 hours) at 10/08/14 0833 Last data filed at 10/08/14 [id number]  Gross per 24 hour  Intake   1022 ml  Output   1925 ml  Net   -903 ml    Telemetry reveals sinus rhythm with occasional pvcs, short NSVT  GEN- The patient is well appearing, alert and oriented x 3 today.   Head- normocephalic, atraumatic Eyes-  Sclera clear, conjunctiva pink Ears- hearing intact Oropharynx- clear Neck- supple,   Lungs- Clear to ausculation bilaterally, normal work of breathing Heart- Regular rate and rhythm, no murmurs, rubs or gallops, PMI not laterally displaced GI- soft, NT, ND, + BS Extremities- no clubbing, cyanosis, or edema Skin- no rash or lesion Psych- euthymic mood, full affect Neuro- strength and sensation are intact  LABS: Basic Metabolic Panel:  Recent Labs  10/07/14 0422 10/08/14 0502  NA 136 139  K 5.4* 4.9  CL 110 111  CO2 19* 20*  GLUCOSE 97 107*  BUN 23* 28*  CREATININE 1.60* 1.76*  CALCIUM 8.6*  9.3   Liver Function Tests:  Recent Labs  10/07/14 0422  AST 17  ALT 15*  ALKPHOS 64  BILITOT 0.4  PROT 6.3*  ALBUMIN 3.3*   No results for input(s): LIPASE, AMYLASE in the last 72 hours. CBC:  Recent Labs  10/07/14 0422 10/08/14 0502  WBC 8.7 8.0  HGB 11.8* 12.1*  HCT 36.1* 37.7*  MCV 94.5 94.3  PLT 160 172   Cardiac Enzymes:  Recent Labs  10/06/14 1700 10/06/14 2216 10/07/14 0422  TROPONINI <0.03 <0.03 <0.03   BNP: Invalid input(s): POCBNP D-Dimer: No results for input(s): DDIMER in the last 72 hours. Hemoglobin A1C:  Recent Labs  10/06/14 1700  HGBA1C 5.9*   Fasting Lipid Panel:  Recent Labs  10/07/14 0422  CHOL 156  HDL 38*  LDLCALC 107*  TRIG 56  CHOLHDL 4.1   Thyroid Function Tests:  Recent Labs  10/06/14 1700  TSH 1.818   Anemia Panel: No results for input(s): VITAMINB12, FOLATE, FERRITIN, TIBC, IRON, RETICCTPCT in the last 72 hours.  RADIOLOGY: No results found.  ASSESSMENT AND PLAN:   1. Unstable angina pectoris - Continue IV heparin -Transition nitro to imdur - Continue home doses of aspirin, beta  blocker and high-dose statin. - plan for cath on Monday  2.  CKD (chronic kidney disease) stage 4, GFR 15-29 ml/min: Renal function slightly worse today.   - Hold his Microzide and ACE inhibitor and follow - Gentle hydration with 176ml NS x10 hours  3. Tobacco abuse - Cessation advised  4. Essential hypertension, benign: BP not at goal for the last 24 hours. - Hold Microzide and lisinopril. - Increase hydralazine to 100mg  tid - Continue metoprolol.  Consider switching to carvedilol given bradycardia and poor BP control  5. Hyperlipidemia On high dose statin  6. Anemia of chronic disease Stable    Sharol Harness, MD 10/08/2014 8:33 AM

## 2014-10-08 NOTE — Progress Notes (Signed)
ANTICOAGULATION CONSULT NOTE -  Follow up  Pharmacy Consult for Heparin  Indication: Unstable angina pectoris. ACS/STEMI  No Known Allergies  Patient Measurements: Height: 5\' 6"  (167.6 cm) Weight: 190 lb 3.2 oz (86.274 kg) IBW/kg (Calculated) : 63.8 Heparin Dosing Weight: 81.7 kg   Vital Signs: Temp: 97.8 F (36.6 C) (09/11 1137) Temp Source: Oral (09/11 0428) BP: 175/62 mmHg (09/11 1137) Pulse Rate: 56 (09/11 1137)   Recent Labs  10/06/14 1700 10/06/14 2216  10/07/14 0422 10/07/14 0820 10/07/14 1930 10/08/14 0502  HGB 11.8*  --   --  11.8*  --   --  12.1*  HCT 35.5*  --   --  36.1*  --   --  37.7*  PLT 155  --   --  160  --   --  172  HEPARINUNFRC 0.87*  --   < >  --  0.75* 0.38 0.29*  CREATININE  --   --   --  1.60*  --   --  1.76*  TROPONINI <0.03 <0.03  --  <0.03  --   --   --   < > = values in this interval not displayed.  Estimated Creatinine Clearance: 33.3 mL/min (by C-G formula based on Cr of 1.76).   Assessment: 81 y.o male admitted 10/06/2014 from [place] for CP, SOB, diaphoresis. Received SL NTG x 3, ASA 81 x 4, morphine. Heparin gtt started at [facility] with 4000 units bolus x 1. Pharmacy consulted to dose heparin.   PMH HTN, CKD stage IV, tobacco use, CAD s/p DES x 1 to LAD (2014), HLD  Heparin level = 0.29  Goal of Therapy:  Heparin level 0.3-0.7 units/ml Monitor platelets by anticoagulation protocol: Yes   Plan:  Heparin to 850 units / hr Daily heparin level and CBC Cath planned Monday Monitor s/sx bleeding   Thank you Anette Guarneri, PharmD [phone]  12:32 PM 10/07/14

## 2014-10-09 LAB — CBC
HEMATOCRIT: 32.7 % — AB (ref 39.0–52.0)
HEMOGLOBIN: 10.4 g/dL — AB (ref 13.0–17.0)
MCH: 29.8 pg (ref 26.0–34.0)
MCHC: 31.8 g/dL (ref 30.0–36.0)
MCV: 93.7 fL (ref 78.0–100.0)
Platelets: 153 10*3/uL (ref 150–400)
RBC: 3.49 MIL/uL — ABNORMAL LOW (ref 4.22–5.81)
RDW: 14.4 % (ref 11.5–15.5)
WBC: 6.9 10*3/uL (ref 4.0–10.5)

## 2014-10-09 LAB — BASIC METABOLIC PANEL
ANION GAP: 6 (ref 5–15)
BUN: 28 mg/dL — AB (ref 6–20)
CHLORIDE: 114 mmol/L — AB (ref 101–111)
CO2: 18 mmol/L — ABNORMAL LOW (ref 22–32)
Calcium: 8.3 mg/dL — ABNORMAL LOW (ref 8.9–10.3)
Creatinine, Ser: 1.84 mg/dL — ABNORMAL HIGH (ref 0.61–1.24)
GFR calc Af Amer: 38 mL/min — ABNORMAL LOW (ref >60)
GFR calc non Af Amer: 32 mL/min — ABNORMAL LOW (ref >60)
GLUCOSE: 104 mg/dL — AB (ref 65–99)
POTASSIUM: 4.7 mmol/L (ref 3.5–5.1)
SODIUM: 138 mmol/L (ref 135–145)

## 2014-10-09 LAB — PROTIME-INR
INR: 1.06 (ref 0.00–1.49)
Prothrombin Time: 14 seconds (ref 11.6–15.2)

## 2014-10-09 LAB — HEPARIN LEVEL (UNFRACTIONATED)
HEPARIN UNFRACTIONATED: 0.6 [IU]/mL (ref 0.30–0.70)
Heparin Unfractionated: 0.26 IU/mL — ABNORMAL LOW (ref 0.30–0.70)

## 2014-10-09 NOTE — Progress Notes (Signed)
Subjective: No further chest pain, no SOB   Objective: Vital signs in last 24 hours: Temp:  [97.8 F (36.6 C)-98.5 F (36.9 C)] 98.3 F (36.8 C) (09/12 0752) Pulse Rate:  [50-63] 59 (09/12 0752) Resp:  [18-20] 18 (09/12 0752) BP: (115-175)/(42-99) 133/99 mmHg (09/12 0752) SpO2:  [97 %-100 %] 97 % (09/12 0752) Weight:  [193 lb 12.8 oz (87.907 kg)] 193 lb 12.8 oz (87.907 kg) (09/12 0600) Weight change: 2 lb (0.907 kg) Last BM Date: 10/09/14 Intake/Output from previous day: +670 09/11 0701 - 09/12 0700 In: 2295.3 [P.O.:1060; I.V.:1235.3] Out: 1625 [Urine:1625] Intake/Output this shift:    PE: General:Pleasant affect, NAD Skin:Warm and dry, brisk capillary refill HEENT:normocephalic, sclera clear, mucus membranes moist Heart:S1S2 RRR without murmur, gallup, rub or click Lungs:clear without rales, rhonchi, or wheezes [id number], non tender, + BS, do not palpate liver spleen or masses Ext:no lower ext edema, 2+ pedal pulses, 2+ radial pulses Neuro:alert and oriented X 3, MAE, follows commands, + facial symmetry Tele:  SR to SB to 44 in night    Lab Results:  Recent Labs  10/08/14 0502 10/09/14 0307  WBC 8.0 6.9  HGB 12.1* 10.4*  HCT 37.7* 32.7*  PLT 172 153   BMET  Recent Labs  10/08/14 0502 10/09/14 0357  NA 139 138  K 4.9 4.7  CL 111 114*  CO2 20* 18*  GLUCOSE 107* 104*  BUN 28* 28*  CREATININE 1.76* 1.84*  CALCIUM 9.3 8.3*    Recent Labs  10/06/14 2216 10/07/14 0422  TROPONINI <0.03 <0.03    Lab Results  Component Value Date   CHOL 156 10/07/2014   HDL 38* 10/07/2014   LDLCALC 107* 10/07/2014   TRIG 56 10/07/2014   CHOLHDL 4.1 10/07/2014   Lab Results  Component Value Date   HGBA1C 5.9* 10/06/2014     Lab Results  Component Value Date   TSH 1.818 10/06/2014    Hepatic Function Panel  Recent Labs  10/07/14 0422  PROT 6.3*  ALBUMIN 3.3*  AST 17  ALT 15*  ALKPHOS 64  BILITOT 0.4    Recent Labs  10/07/14 0422    CHOL 156   No results for input(s): PROTIME in the last 72 hours.     Studies/Results: Echo: Study Conclusions  - Left ventricle: The cavity size was normal. Wall thickness was increased in a pattern of mild LVH. Systolic function was normal. The estimated ejection fraction was in the range of 60% to 65%. Wall motion was normal; there were no regional wall motion abnormalities. Doppler parameters are consistent with abnormal left ventricular relaxation (grade 1 diastolic dysfunction). - Left atrium: The atrium was mildly dilated  Medications: I have reviewed the patient's current medications. Scheduled Meds: . amLODipine  10 mg Oral Daily  . aspirin EC  81 mg Oral Daily  . atorvastatin  80 mg Oral QHS  . cholecalciferol  1,000 Units Oral Daily  . hydrALAZINE  100 mg Oral TID  . iron polysaccharides  150 mg Oral Daily  . isosorbide mononitrate  30 mg Oral Daily  . metoprolol tartrate  25 mg Oral BID  . sodium chloride  3 mL Intravenous Q12H  . sodium chloride  3 mL Intravenous Q12H  . sodium chloride  3 mL Intravenous Q12H   Continuous Infusions: . sodium chloride 75 mL/hr at 10/09/14 0634  . sodium chloride 100 mL/hr at 10/08/14 1113  . sodium chloride 1 mL/kg/hr (10/09/14 0831)  .  heparin 1,000 Units/hr (10/09/14 0449)  . nitroGLYCERIN Stopped (10/07/14 0100)   PRN Meds:.sodium chloride, sodium chloride, sodium chloride, acetaminophen, ALPRAZolam, hydrALAZINE, morphine injection, nitroGLYCERIN, ondansetron (ZOFRAN) IV, sodium chloride, sodium chloride, sodium chloride, zolpidem  Assessment/Plan: Principal Problem:   Unstable angina pectoris- neg MI.  No further chest pain on IV heparin.  Was for cath today but with rising Cr. Will hold and continue gently hydration and plan for tomorrow.  If Cr still elevated unless increasing would proceed with cath. Pt and wife understanding.    Active Problems:   CKD (chronic kidney disease) stage 3-4, GFR 15-29 ml/min  Cr this AM 1.84 and GFR down to 38 from 45  CAD - hx stent to LAD and occluded RCA and mod dz LCX,   Tobacco abuse   Essential hypertension, benign   Hyperlipidemia   Anemia of chronic disease   Unstable angina    LOS: 3 days   Time spent with pt. :15 minutes. [facility] R  Nurse Practitioner Certified Pager [phone] or after 5pm and on weekends call [phone] 10/09/2014, 9:30 AM

## 2014-10-09 NOTE — Progress Notes (Signed)
ANTICOAGULATION CONSULT NOTE -  Follow up  Pharmacy Consult for Heparin  Indication: Unstable angina, ACS/STEMI  No Known Allergies  Patient Measurements: Height: 5\' 6"  (167.6 cm) Weight: 193 lb 12.8 oz (87.907 kg) (Scale B) IBW/kg (Calculated) : 63.8 Heparin Dosing Weight: 81.7 kg   Vital Signs: Temp: 97.8 F (36.6 C) (09/12 1200) Temp Source: Oral (09/12 1200) BP: 125/51 mmHg (09/12 1200) Pulse Rate: 52 (09/12 1200)   Recent Labs  10/06/14 1700 10/06/14 2216  10/07/14 0422  10/08/14 0502 10/09/14 0307 10/09/14 0357 10/09/14 1117  HGB 11.8*  --   --  11.8*  --  12.1* 10.4*  --   --   HCT 35.5*  --   --  36.1*  --  37.7* 32.7*  --   --   PLT 155  --   --  160  --  172 153  --   --   LABPROT  --   --   --   --   --   --  14.0  --   --   INR  --   --   --   --   --   --  1.06  --   --   HEPARINUNFRC 0.87*  --   < >  --   < > 0.29* 0.26*  --  0.60  CREATININE  --   --   --  1.60*  --  1.76*  --  1.84*  --   TROPONINI <0.03 <0.03  --  <0.03  --   --   --   --   --   < > = values in this interval not displayed.  Estimated Creatinine Clearance: 32.1 mL/min (by C-G formula based on Cr of 1.84).   Assessment: 81 y.o male admitted 10/06/2014 from [place] for CP, SOB, diaphoresis. Received SL NTG x 3, ASA 81 x 4, morphine. Heparin gtt started at [facility] with 4000 units bolus x 1. Pharmacy consulted to dose heparin.   PMH HTN, CKD stage IV, tobacco use, CAD s/p DES x 1 to LAD (2014), HLD  Heparin level = 0.6 this am, no bleeding noted. Cath cancelled for today in light of increasing scr. Cath being rescheduled for tomorrow.  Goal of Therapy:  Heparin level 0.3-0.7 units/ml Monitor platelets by anticoagulation protocol: Yes   Plan:  Continue Heparin at 1000 units / hr Daily heparin level and CBC Cath postponed Monitor s/sx bleeding   Erin Hearing PharmD., BCPS Clinical Pharmacist Pager [phone] 10/09/2014 12:41 PM

## 2014-10-09 NOTE — Care Management Important Message (Signed)
Important Message  Patient Details  Name: Kevin [name] MRN: [id number] Date of Birth: 1930/04/09   Medicare Important Message Given:  Yes-second notification given    Loann Quill 10/09/2014, 12:49 PM

## 2014-10-09 NOTE — Progress Notes (Signed)
ANTICOAGULATION CONSULT NOTE - Follow Up Consult  Pharmacy Consult for heparin Indication: USAP   Labs:  Recent Labs  10/06/14 1700 10/06/14 2216  10/07/14 0422  10/07/14 1930 10/08/14 0502 10/09/14 0307  HGB 11.8*  --   --  11.8*  --   --  12.1* 10.4*  HCT 35.5*  --   --  36.1*  --   --  37.7* 32.7*  PLT 155  --   --  160  --   --  172 153  LABPROT  --   --   --   --   --   --   --  14.0  INR  --   --   --   --   --   --   --  1.06  HEPARINUNFRC 0.87*  --   < >  --   < > 0.38 0.29* 0.26*  CREATININE  --   --   --  1.60*  --   --  1.76*  --   TROPONINI <0.03 <0.03  --  <0.03  --   --   --   --   < > = values in this interval not displayed.    Assessment: 81yo male subtherapeutic on heparin with lower level despite increased rate.  Goal of Therapy:  Heparin level 0.3-0.7 units/ml   Plan:  Will increase heparin gtt by 1-2 units to 1000 units/hr and check level prior to cath.  Wynona Neat, PharmD, BCPS  10/09/2014,4:47 AM

## 2014-10-10 ENCOUNTER — Encounter (HOSPITAL_COMMUNITY)
Admission: AD | Disposition: A | Discharge status: Home / Self Care | Payer: Medicare Other | Source: Other Acute Inpatient Hospital | Attending: Cardiovascular Disease

## 2014-10-10 ENCOUNTER — Encounter (HOSPITAL_COMMUNITY): Payer: Self-pay | Admitting: *Deleted

## 2014-10-10 DIAGNOSIS — I25110 Atherosclerotic heart disease of native coronary artery with unstable angina pectoris: Principal | ICD-10-CM

## 2014-10-10 HISTORY — PX: CARDIAC CATHETERIZATION: SHX172

## 2014-10-10 LAB — CBC
HCT: 32.4 % — ABNORMAL LOW (ref 39.0–52.0)
HEMOGLOBIN: 10.3 g/dL — AB (ref 13.0–17.0)
MCH: 30.2 pg (ref 26.0–34.0)
MCHC: 31.8 g/dL (ref 30.0–36.0)
MCV: 95 fL (ref 78.0–100.0)
PLATELETS: 136 10*3/uL — AB (ref 150–400)
RBC: 3.41 MIL/uL — AB (ref 4.22–5.81)
RDW: 14.7 % (ref 11.5–15.5)
WBC: 7.9 10*3/uL (ref 4.0–10.5)

## 2014-10-10 LAB — TROPONIN I: TROPONIN I: 0.14 ng/mL — AB (ref <0.031)

## 2014-10-10 LAB — BASIC METABOLIC PANEL
ANION GAP: 5 (ref 5–15)
BUN: 23 mg/dL — ABNORMAL HIGH (ref 6–20)
CALCIUM: 8.8 mg/dL — AB (ref 8.9–10.3)
CO2: 17 mmol/L — ABNORMAL LOW (ref 22–32)
Chloride: 119 mmol/L — ABNORMAL HIGH (ref 101–111)
Creatinine, Ser: 1.64 mg/dL — ABNORMAL HIGH (ref 0.61–1.24)
GFR, EST AFRICAN AMERICAN: 43 mL/min — AB (ref >60)
GFR, EST NON AFRICAN AMERICAN: 37 mL/min — AB (ref >60)
GLUCOSE: 96 mg/dL (ref 65–99)
Potassium: 4.8 mmol/L (ref 3.5–5.1)
SODIUM: 141 mmol/L (ref 135–145)

## 2014-10-10 LAB — HEPARIN LEVEL (UNFRACTIONATED): HEPARIN UNFRACTIONATED: 0.63 [IU]/mL (ref 0.30–0.70)

## 2014-10-10 SURGERY — LEFT HEART CATH AND CORONARY ANGIOGRAPHY
Anesthesia: LOCAL

## 2014-10-10 MED ORDER — IOHEXOL 350 MG/ML SOLN
INTRAVENOUS | Status: DC | PRN
  Administered 2014-10-10: 100 mL via INTRA_ARTERIAL

## 2014-10-10 MED ORDER — NITROGLYCERIN 0.4 MG/SPRAY TL SOLN
Status: DC | PRN
  Administered 2014-10-10 (×2): 1 via SUBLINGUAL

## 2014-10-10 MED ORDER — SODIUM CHLORIDE 0.9 % IJ SOLN: 3.0000 mL | INTRAMUSCULAR | Status: DC | PRN

## 2014-10-10 MED ORDER — IOHEXOL 350 MG/ML SOLN: INTRAVENOUS | Status: DC | PRN

## 2014-10-10 MED ORDER — VERAPAMIL HCL 2.5 MG/ML IV SOLN
INTRAVENOUS | Status: AC
  Filled 2014-10-10: qty 2

## 2014-10-10 MED ORDER — FENTANYL CITRATE (PF) 100 MCG/2ML IJ SOLN
INTRAMUSCULAR | Status: DC | PRN
  Administered 2014-10-10 (×3): 50 ug via INTRAVENOUS

## 2014-10-10 MED ORDER — SODIUM CHLORIDE 0.9 % IV SOLN: 250.0000 mL | INTRAVENOUS | Status: DC | PRN

## 2014-10-10 MED ORDER — ONDANSETRON HCL 4 MG/2ML IJ SOLN
INTRAMUSCULAR | Status: DC | PRN
  Administered 2014-10-10: 4 mg via INTRAVENOUS

## 2014-10-10 MED ORDER — LIDOCAINE HCL (PF) 1 % IJ SOLN
INTRAMUSCULAR | Status: AC
  Filled 2014-10-10: qty 30

## 2014-10-10 MED ORDER — FENTANYL CITRATE (PF) 100 MCG/2ML IJ SOLN
INTRAMUSCULAR | Status: AC
  Filled 2014-10-10: qty 4

## 2014-10-10 MED ORDER — MIDAZOLAM HCL 2 MG/2ML IJ SOLN
INTRAMUSCULAR | Status: DC | PRN
  Administered 2014-10-10 (×2): 1 mg via INTRAVENOUS

## 2014-10-10 MED ORDER — LIDOCAINE HCL (PF) 1 % IJ SOLN
INTRAMUSCULAR | Status: DC | PRN
  Administered 2014-10-10: 16:00:00

## 2014-10-10 MED ORDER — OXYCODONE-ACETAMINOPHEN 5-325 MG PO TABS: 1.0000 | ORAL_TABLET | ORAL | Status: DC | PRN

## 2014-10-10 MED ORDER — SODIUM CHLORIDE 0.9 % IJ SOLN
3.0000 mL | Freq: Two times a day (BID) | INTRAMUSCULAR | Status: DC
  Administered 2014-10-11: 3 mL via INTRAVENOUS

## 2014-10-10 MED ORDER — LIDOCAINE HCL (PF) 1 % IJ SOLN: INTRAMUSCULAR | Status: DC | PRN

## 2014-10-10 MED ORDER — ONDANSETRON HCL 4 MG/2ML IJ SOLN
INTRAMUSCULAR | Status: AC
  Filled 2014-10-10: qty 2

## 2014-10-10 MED ORDER — HYDRALAZINE HCL 20 MG/ML IJ SOLN
INTRAMUSCULAR | Status: AC
  Filled 2014-10-10: qty 1

## 2014-10-10 MED ORDER — ACETAMINOPHEN 325 MG PO TABS: 650.0000 mg | ORAL_TABLET | ORAL | Status: DC | PRN

## 2014-10-10 MED ORDER — SODIUM CHLORIDE 0.9 % WEIGHT BASED INFUSION
3.0000 mL/kg/h | INTRAVENOUS | Status: AC
  Administered 2014-10-10: 3 mL/kg/h via INTRAVENOUS

## 2014-10-10 MED ORDER — NITROGLYCERIN 0.4 MG/SPRAY TL SOLN
Status: AC
  Filled 2014-10-10: qty 4.9

## 2014-10-10 MED ORDER — MIDAZOLAM HCL 2 MG/2ML IJ SOLN
INTRAMUSCULAR | Status: AC
  Filled 2014-10-10: qty 4

## 2014-10-10 MED ORDER — METOPROLOL TARTRATE 1 MG/ML IV SOLN
5.0000 mg | Freq: Once | INTRAVENOUS | Status: AC
  Administered 2014-10-10: 5 mg via INTRAVENOUS

## 2014-10-10 SURGICAL SUPPLY — 15 items
CATH INFINITI 5FR JL4 (CATHETERS) ×2 IMPLANT
CATH INFINITI JR4 5F (CATHETERS) ×4 IMPLANT
CATH SITESEER 5F MULTI A 2 (CATHETERS) ×2 IMPLANT
CATH VISTA GUIDE 6FR XBLAD3.5 (CATHETERS) ×6 IMPLANT
DEVICE RAD COMP TR BAND LRG (VASCULAR PRODUCTS) ×4 IMPLANT
GLIDESHEATH SLEND A-KIT 6F 22G (SHEATH) ×2 IMPLANT
KIT HEART LEFT (KITS) ×4 IMPLANT
PACK CARDIAC CATHETERIZATION (CUSTOM PROCEDURE TRAY) ×4 IMPLANT
SHEATH PINNACLE 5F 10CM (SHEATH) ×2 IMPLANT
SHEATH PINNACLE 6F 10CM (SHEATH) ×2 IMPLANT
TRANSDUCER W/STOPCOCK (MISCELLANEOUS) ×4 IMPLANT
TUBING CIL FLEX 10 FLL-RA (TUBING) ×2 IMPLANT
WIRE EMERALD 3MM-J .035X150CM (WIRE) ×4 IMPLANT
WIRE HI TORQ VERSACORE-J 145CM (WIRE) ×2 IMPLANT
WIRE SAFE-T 1.5MM-J .035X260CM (WIRE) ×2 IMPLANT

## 2014-10-10 NOTE — Progress Notes (Signed)
ANTICOAGULATION CONSULT NOTE -  Follow up  Pharmacy Consult for Heparin  Indication: Unstable angina, ACS/STEMI  No Known Allergies  Patient Measurements: Height: 5\' 6"  (167.6 cm) Weight: 194 lb 3.2 oz (88.089 kg) (scale b) IBW/kg (Calculated) : 63.8 Heparin Dosing Weight: 81.7 kg   Vital Signs: Temp: 97.9 F (36.6 C) (09/13 1145) Temp Source: Oral (09/13 1145) BP: 136/46 mmHg (09/13 1145) Pulse Rate: 60 (09/13 1145)   Recent Labs  10/08/14 0502 10/09/14 0307 10/09/14 0357 10/09/14 1117 10/10/14 0312 10/10/14 0753  HGB 12.1* 10.4*  --   --  10.3*  --   HCT 37.7* 32.7*  --   --  32.4*  --   PLT 172 153  --   --  136*  --   LABPROT  --  14.0  --   --   --   --   INR  --  1.06  --   --   --   --   HEPARINUNFRC 0.29* 0.26*  --  0.60 0.63  --   CREATININE 1.76*  --  1.84*  --   --  1.64*    Estimated Creatinine Clearance: 36.1 mL/min (by C-G formula based on Cr of 1.64).  Assessment: 81 y.o male admitted 10/06/2014 from [place] for CP, SOB, diaphoresis. Received SL NTG x 3, ASA 81 x 4, morphine. Heparin gtt started at [facility] with 4000 units bolus x 1. Pharmacy consulted to dose heparin.   PMH HTN, CKD stage IV, tobacco use, CAD s/p DES x 1 to LAD (2014), HLD  Cath was postponed until today due to increasing SCr. Now trending back down slightly. Last HL this am was therapeutic at 0.63. Hgb at 10.3, plts trending down to 136. No bleeding noted  Goal of Therapy:  Heparin level 0.3-0.7 units/ml Monitor platelets by anticoagulation protocol: Yes   Plan:  Continue heparin at 1000 units/hr Monitor daily HL, CBC, s/s of bleed F/U after cath today  Elenor Quinones, PharmD Clinical Pharmacist Pager [phone] 10/10/2014 12:57 PM

## 2014-10-10 NOTE — Progress Notes (Addendum)
Dr Tamala Julian in to see pt about his AVR , pain level, BP, and plan of care.   Lopressor 5mg  IV ordered.

## 2014-10-10 NOTE — Interval H&P Note (Signed)
Cath Lab Visit (complete for each Cath Lab visit)  Clinical Evaluation Leading to the Procedure:   ACS: Yes.    Non-ACS:    Anginal Classification: CCS IV  Anti-ischemic medical therapy: Maximal Therapy (2 or more classes of medications)  Non-Invasive Test Results: No non-invasive testing performed  Prior CABG: No previous CABG      History and Physical Interval Note:  10/10/2014 7:18 AM  Kevin [name]  has presented today for surgery, with the diagnosis of cp  The various methods of treatment have been discussed with the patient and family. After consideration of risks, benefits and other options for treatment, the patient has consented to  Procedure(s): Left Heart Cath and Coronary Angiography (N/A) as a surgical intervention .  The patient's history has been reviewed, patient examined, no change in status, stable for surgery.  I have reviewed the patient's chart and labs.  Questions were answered to the patient's satisfaction.     Sinclair Grooms

## 2014-10-10 NOTE — Progress Notes (Signed)
Subjective: No complaints  Objective: Vital signs in last 24 hours: Temp:  [97.8 F (36.6 C)-98.6 F (37 C)] 98 F (36.7 C) (09/13 0730) Pulse Rate:  [52-62] 62 (09/13 0730) Resp:  [18] 18 (09/13 0730) BP: (125-159)/(46-60) 159/47 mmHg (09/13 0730) SpO2:  [95 %-99 %] 99 % (09/13 0730) Weight:  [194 lb 3.2 oz (88.089 kg)] 194 lb 3.2 oz (88.089 kg) (09/13 0526) Weight change: 6.4 oz (0.181 kg) Last BM Date: 10/10/14 Intake/Output from previous day: 09/12 0701 - 09/13 0700 In: 1289.6 [P.O.:720; I.V.:569.6] Out: 1000 [Urine:1000] Intake/Output this shift: Total I/O In: 0  Out: 275 [Urine:275]  PE: General:Pleasant affect, NAD Skin:Warm and dry, brisk capillary refill HEENT:normocephalic, sclera clear, mucus membranes moist Heart:S1S2 RRR without murmur, gallup, rub or click Lungs:clear without rales, rhonchi, or wheezes [id number], non tender, + BS, do not palpate liver spleen or masses Ext:no lower ext edema, 2+ pedal pulses, 2+ radial pulses Neuro:alert and oriented, MAE, follows commands, + facial symmetry Tele:  SR to SB with PVCs   Lab Results:  Recent Labs  10/09/14 0307 10/10/14 0312  WBC 6.9 7.9  HGB 10.4* 10.3*  HCT 32.7* 32.4*  PLT 153 136*   BMET  Recent Labs  10/09/14 0357 10/10/14 0753  NA 138 141  K 4.7 4.8  CL 114* 119*  CO2 18* 17*  GLUCOSE 104* 96  BUN 28* 23*  CREATININE 1.84* 1.64*  CALCIUM 8.3* 8.8*   No results for input(s): TROPONINI in the last 72 hours.  Invalid input(s): CK, MB  Lab Results  Component Value Date   CHOL 156 10/07/2014   HDL 38* 10/07/2014   LDLCALC 107* 10/07/2014   TRIG 56 10/07/2014   CHOLHDL 4.1 10/07/2014   Lab Results  Component Value Date   HGBA1C 5.9* 10/06/2014     Lab Results  Component Value Date   TSH 1.818 10/06/2014     Studies/Results: ECHO: Study Conclusions  - Left ventricle: The cavity size was normal. Wall thickness was increased in a pattern of mild LVH.  Systolic function was normal. The estimated ejection fraction was in the range of 60% to 65%. Wall motion was normal; there were no regional wall motion abnormalities. Doppler parameters are consistent with abnormal left ventricular relaxation (grade 1 diastolic dysfunction). - Left atrium: The atrium was mildly dilated.  Medications: I have reviewed the patient's current medications. Scheduled Meds: . amLODipine  10 mg Oral Daily  . aspirin EC  81 mg Oral Daily  . atorvastatin  80 mg Oral QHS  . cholecalciferol  1,000 Units Oral Daily  . hydrALAZINE  100 mg Oral TID  . iron polysaccharides  150 mg Oral Daily  . isosorbide mononitrate  30 mg Oral Daily  . metoprolol tartrate  25 mg Oral BID  . sodium chloride  3 mL Intravenous Q12H  . sodium chloride  3 mL Intravenous Q12H  . sodium chloride  3 mL Intravenous Q12H   Continuous Infusions: . sodium chloride 50 mL/hr at 10/09/14 1035  . sodium chloride 100 mL/hr at 10/08/14 1113  . heparin 1,000 Units/hr (10/09/14 0449)  . nitroGLYCERIN Stopped (10/07/14 0100)   PRN Meds:.sodium chloride, sodium chloride, sodium chloride, acetaminophen, ALPRAZolam, hydrALAZINE, morphine injection, nitroGLYCERIN, ondansetron (ZOFRAN) IV, sodium chloride, sodium chloride, sodium chloride, zolpidem  Assessment/Plan: Principal Problem:  Unstable angina pectoris- neg MI. No further chest pain on IV heparin. Cath today Cr improved after > 24 hours of IV gentle hydration.  If Cr still elevated unless increasing would proceed with cath.  No further pain  Active Problems:  CKD (chronic kidney disease) stage 3-4, GFR 15-29 ml/min Cr this AM 1.64  CAD - hx stent to LAD and occluded RCA and mod dz LCX,  Tobacco abuse  Essential hypertension, benign  Hyperlipidemia  Anemia of chronic disease  Unstable angina   LOS: 4 days   Time spent with pt. :15 minutes. [facility] R  Nurse Practitioner Certified Pager [phone] or after 5pm and on  weekends call [phone] 10/10/2014, 9:00 AM

## 2014-10-10 NOTE — H&P (View-Only) (Signed)
Subjective: No further chest pain, no SOB   Objective: Vital signs in last 24 hours: Temp:  [97.8 F (36.6 C)-98.5 F (36.9 C)] 98.3 F (36.8 C) (09/12 0752) Pulse Rate:  [50-63] 59 (09/12 0752) Resp:  [18-20] 18 (09/12 0752) BP: (115-175)/(42-99) 133/99 mmHg (09/12 0752) SpO2:  [97 %-100 %] 97 % (09/12 0752) Weight:  [193 lb 12.8 oz (87.907 kg)] 193 lb 12.8 oz (87.907 kg) (09/12 0600) Weight change: 2 lb (0.907 kg) Last BM Date: 10/09/14 Intake/Output from previous day: +670 09/11 0701 - 09/12 0700 In: 2295.3 [P.O.:1060; I.V.:1235.3] Out: 1625 [Urine:1625] Intake/Output this shift:    PE: General:Pleasant affect, NAD Skin:Warm and dry, brisk capillary refill HEENT:normocephalic, sclera clear, mucus membranes moist Heart:S1S2 RRR without murmur, gallup, rub or click Lungs:clear without rales, rhonchi, or wheezes [id number], non tender, + BS, do not palpate liver spleen or masses Ext:no lower ext edema, 2+ pedal pulses, 2+ radial pulses Neuro:alert and oriented X 3, MAE, follows commands, + facial symmetry Tele:  SR to SB to 44 in night    Lab Results:  Recent Labs  10/08/14 0502 10/09/14 0307  WBC 8.0 6.9  HGB 12.1* 10.4*  HCT 37.7* 32.7*  PLT 172 153   BMET  Recent Labs  10/08/14 0502 10/09/14 0357  NA 139 138  K 4.9 4.7  CL 111 114*  CO2 20* 18*  GLUCOSE 107* 104*  BUN 28* 28*  CREATININE 1.76* 1.84*  CALCIUM 9.3 8.3*    Recent Labs  10/06/14 2216 10/07/14 0422  TROPONINI <0.03 <0.03    Lab Results  Component Value Date   CHOL 156 10/07/2014   HDL 38* 10/07/2014   LDLCALC 107* 10/07/2014   TRIG 56 10/07/2014   CHOLHDL 4.1 10/07/2014   Lab Results  Component Value Date   HGBA1C 5.9* 10/06/2014     Lab Results  Component Value Date   TSH 1.818 10/06/2014    Hepatic Function Panel  Recent Labs  10/07/14 0422  PROT 6.3*  ALBUMIN 3.3*  AST 17  ALT 15*  ALKPHOS 64  BILITOT 0.4    Recent Labs  10/07/14 0422    CHOL 156   No results for input(s): PROTIME in the last 72 hours.     Studies/Results: Echo: Study Conclusions  - Left ventricle: The cavity size was normal. Wall thickness was increased in a pattern of mild LVH. Systolic function was normal. The estimated ejection fraction was in the range of 60% to 65%. Wall motion was normal; there were no regional wall motion abnormalities. Doppler parameters are consistent with abnormal left ventricular relaxation (grade 1 diastolic dysfunction). - Left atrium: The atrium was mildly dilated  Medications: I have reviewed the patient's current medications. Scheduled Meds: . amLODipine  10 mg Oral Daily  . aspirin EC  81 mg Oral Daily  . atorvastatin  80 mg Oral QHS  . cholecalciferol  1,000 Units Oral Daily  . hydrALAZINE  100 mg Oral TID  . iron polysaccharides  150 mg Oral Daily  . isosorbide mononitrate  30 mg Oral Daily  . metoprolol tartrate  25 mg Oral BID  . sodium chloride  3 mL Intravenous Q12H  . sodium chloride  3 mL Intravenous Q12H  . sodium chloride  3 mL Intravenous Q12H   Continuous Infusions: . sodium chloride 75 mL/hr at 10/09/14 0634  . sodium chloride 100 mL/hr at 10/08/14 1113  . sodium chloride 1 mL/kg/hr (10/09/14 0831)  .  heparin 1,000 Units/hr (10/09/14 0449)  . nitroGLYCERIN Stopped (10/07/14 0100)   PRN Meds:.sodium chloride, sodium chloride, sodium chloride, acetaminophen, ALPRAZolam, hydrALAZINE, morphine injection, nitroGLYCERIN, ondansetron (ZOFRAN) IV, sodium chloride, sodium chloride, sodium chloride, zolpidem  Assessment/Plan: Principal Problem:   Unstable angina pectoris- neg MI.  No further chest pain on IV heparin.  Was for cath today but with rising Cr. Will hold and continue gently hydration and plan for tomorrow.  If Cr still elevated unless increasing would proceed with cath. Pt and wife understanding.    Active Problems:   CKD (chronic kidney disease) stage 3-4, GFR 15-29 ml/min  Cr this AM 1.84 and GFR down to 38 from 45  CAD - hx stent to LAD and occluded RCA and mod dz LCX,   Tobacco abuse   Essential hypertension, benign   Hyperlipidemia   Anemia of chronic disease   Unstable angina    LOS: 3 days   Time spent with pt. :15 minutes. [facility] R  Nurse Practitioner Certified Pager [phone] or after 5pm and on weekends call [phone] 10/09/2014, 9:30 AM

## 2014-10-10 NOTE — Progress Notes (Signed)
Site area: right groin a 6 french arterial sheath was removed  Site Prior to Removal:  Level 0  Pressure Applied For 15 MINUTES    Minutes Beginning at 1600p  Manual:   Yes.    Patient Status During Pull:  stable  Post Pull Groin Site:  Level 0  Post Pull Instructions Given:  Yes.    Post Pull Pulses Present:  Yes.    Dressing Applied:  Yes.    Comments:  VS remain stable.  Lopressor 5mg  IV given per MD order for BP.

## 2014-10-11 ENCOUNTER — Inpatient Hospital Stay (HOSPITAL_COMMUNITY): Payer: Medicare Other

## 2014-10-11 ENCOUNTER — Encounter (HOSPITAL_COMMUNITY): Payer: Self-pay | Admitting: Interventional Cardiology

## 2014-10-11 DIAGNOSIS — Z9861 Coronary angioplasty status: Secondary | ICD-10-CM

## 2014-10-11 DIAGNOSIS — I2510 Atherosclerotic heart disease of native coronary artery without angina pectoris: Secondary | ICD-10-CM | POA: Diagnosis present

## 2014-10-11 DIAGNOSIS — R079 Chest pain, unspecified: Secondary | ICD-10-CM

## 2014-10-11 LAB — CBC
HCT: 31.3 % — ABNORMAL LOW (ref 39.0–52.0)
HEMOGLOBIN: 10.1 g/dL — AB (ref 13.0–17.0)
MCH: 30.3 pg (ref 26.0–34.0)
MCHC: 32.3 g/dL (ref 30.0–36.0)
MCV: 94 fL (ref 78.0–100.0)
PLATELETS: 133 10*3/uL — AB (ref 150–400)
RBC: 3.33 MIL/uL — AB (ref 4.22–5.81)
RDW: 14.7 % (ref 11.5–15.5)
WBC: 8.8 10*3/uL (ref 4.0–10.5)

## 2014-10-11 LAB — BASIC METABOLIC PANEL
ANION GAP: 7 (ref 5–15)
BUN: 20 mg/dL (ref 6–20)
CHLORIDE: 113 mmol/L — AB (ref 101–111)
CO2: 17 mmol/L — ABNORMAL LOW (ref 22–32)
Calcium: 8.4 mg/dL — ABNORMAL LOW (ref 8.9–10.3)
Creatinine, Ser: 1.56 mg/dL — ABNORMAL HIGH (ref 0.61–1.24)
GFR, EST AFRICAN AMERICAN: 46 mL/min — AB (ref >60)
GFR, EST NON AFRICAN AMERICAN: 40 mL/min — AB (ref >60)
Glucose, Bld: 103 mg/dL — ABNORMAL HIGH (ref 65–99)
POTASSIUM: 5 mmol/L (ref 3.5–5.1)
SODIUM: 137 mmol/L (ref 135–145)

## 2014-10-11 LAB — TROPONIN I
TROPONIN I: 0.53 ng/mL — AB (ref <0.031)
TROPONIN I: 0.48 ng/mL — AB (ref <0.031)

## 2014-10-11 LAB — D-DIMER, QUANTITATIVE (NOT AT ARMC): D DIMER QUANT: 5.23 ug{FEU}/mL — AB (ref 0.00–0.48)

## 2014-10-11 MED ORDER — HYDRALAZINE HCL 100 MG PO TABS: 100.0000 mg | ORAL_TABLET | Freq: Three times a day (TID) | ORAL | Status: DC

## 2014-10-11 MED ORDER — ISOSORBIDE MONONITRATE ER 60 MG PO TB24: 60.0000 mg | ORAL_TABLET | Freq: Every day | ORAL | Status: DC

## 2014-10-11 MED ORDER — IOHEXOL 350 MG/ML SOLN
100.0000 mL | Freq: Once | INTRAVENOUS | Status: AC | PRN
  Administered 2014-10-11: 80 mL via INTRAVENOUS

## 2014-10-11 MED ORDER — NITROGLYCERIN 0.4 MG SL SUBL: 0.4000 mg | SUBLINGUAL_TABLET | SUBLINGUAL | Status: DC | PRN

## 2014-10-11 MED ORDER — PANTOPRAZOLE SODIUM 40 MG PO TBEC: 40.0000 mg | DELAYED_RELEASE_TABLET | Freq: Every day | ORAL | Status: DC

## 2014-10-11 MED ORDER — ISOSORBIDE MONONITRATE ER 60 MG PO TB24
60.0000 mg | ORAL_TABLET | Freq: Every day | ORAL | Status: DC
  Administered 2014-10-11: 60 mg via ORAL
  Filled 2014-10-11: qty 1

## 2014-10-11 MED ORDER — NICOTINE 14 MG/24HR TD PT24: 14.0000 mg | MEDICATED_PATCH | Freq: Every day | TRANSDERMAL | Status: DC

## 2014-10-11 MED FILL — Lidocaine HCl Local Preservative Free (PF) Inj 1%: INTRAMUSCULAR | Qty: 30 | Status: AC

## 2014-10-11 NOTE — Progress Notes (Signed)
Patient Name: Kevin [name] Date of Encounter: 10/11/2014     Principal Problem:   Unstable angina pectoris Active Problems:   CKD (chronic kidney disease) stage 4, GFR 15-29 ml/min   Tobacco abuse   Essential hypertension, benign   Hyperlipidemia   Anemia of chronic disease   Unstable angina    SUBJECTIVE No CP or SOB. No complaints  CURRENT MEDS . amLODipine  10 mg Oral Daily  . aspirin EC  81 mg Oral Daily  . atorvastatin  80 mg Oral QHS  . cholecalciferol  1,000 Units Oral Daily  . hydrALAZINE  100 mg Oral TID  . iron polysaccharides  150 mg Oral Daily  . isosorbide mononitrate  30 mg Oral Daily  . metoprolol tartrate  25 mg Oral BID  . sodium chloride  3 mL Intravenous Q12H    OBJECTIVE  Filed Vitals:   10/11/14 0353 10/11/14 0400 10/11/14 0612 10/11/14 0800  BP: 142/52 163/55 163/52 157/55  Pulse: 61 58  61  Temp: 98 F (36.7 C)   98.4 F (36.9 C)  TempSrc: Oral   Oral  Resp: 21 20  14   Height:      Weight: 195 lb 1.7 oz (88.5 kg)     SpO2: 98% 99%  99%    Intake/Output Summary (Last 24 hours) at 10/11/14 0855 Last data filed at 10/10/14 1802  Gross per 24 hour  Intake    384 ml  Output    625 ml  Net   -241 ml   Filed Weights   10/09/14 0600 10/10/14 0526 10/11/14 0353  Weight: 193 lb 12.8 oz (87.907 kg) 194 lb 3.2 oz (88.089 kg) 195 lb 1.7 oz (88.5 kg)    PHYSICAL EXAM  General: Pleasant, NAD. Neuro: Alert and oriented X 3. Moves all extremities spontaneously. Psych: Normal affect. HEENT:  Normal  Neck: Supple without bruits or JVD. Lungs:  Resp regular and unlabored, CTA. Heart: RRR no s3, s4, or murmurs. Abdomen: Soft, non-tender, non-distended, BS + x 4.  Extremities: No clubbing, cyanosis or edema. DP/PT/Radials 2+ and equal bilaterally. Groin site stable  Accessory Clinical Findings  CBC  Recent Labs  10/10/14 0312 10/11/14 0348  WBC 7.9 8.8  HGB 10.3* 10.1*  HCT 32.4* 31.3*  MCV 95.0 94.0  PLT 136* 133*   Basic  Metabolic Panel  Recent Labs  10/10/14 0753 10/11/14 0348  NA 141 137  K 4.8 5.0  CL 119* 113*  CO2 17* 17*  GLUCOSE 96 103*  BUN 23* 20  CREATININE 1.64* 1.56*  CALCIUM 8.8* 8.4*   Cardiac Enzymes  Recent Labs  10/10/14 1837 10/10/14 2208 10/11/14 0348  TROPONINI <0.03 0.14* 0.53*    Radiology/Studies  LHC 10/10/14 Conclusion    1. Mid RCA lesion, 100% stenosed. 2. Mid LAD to Dist LAD lesion, 10% stenosed. The lesion was previously treated with a stent (unknown type) . 3. 3rd Mrg lesion, 90% stenosed. 4. Dist RCA lesion, 100% stenosed. 5. 2nd Diag lesion, 75% stenosed. 6. Dist Cx-1 lesion, 75% stenosed. 7. Dist Cx-2 lesion, 65% stenosed.  Widely patent left anterior descending stent.  Total occlusion of the mid to distal RCA. Distal RCA is supplied by collaterals from the circumflex and LAD.  Highly diseased distal circumflex with a stable eccentric 75% stenosis proximal to the second obtuse marginal. There is diffuse disease beyond the second obtuse marginal and 90% stenosis and a small third obtuse marginal branch. This distal circumflex territory as the source  of collaterals to the occluded right coronary. The anatomy is unchanged from 2014.  LV function is not performed. LV end-diastolic pressure is 10 mmHg.  During 5/10 chest discomfort that developed at completion of the case, repeat angiography revealed identical anatomy to that noted above. RECOMMENDATIONS:  Re-cycle cardiac markers  Consider alternative explanations for the patient's chest discomfort, although the presence of ventricular ectopy during the pain is concerning. The absence of response to sublingual nitroglycerin is also unusual if we consider the discomfort could be ischemia related.  Per treating team     2D ECHO: 10/07/2014 LV EF: 60- 65% Study Conclusions - Left ventricle: The cavity size was normal. Wall thickness was increased in a pattern of mild LVH. Systolic function was  normal. The estimated ejection fraction was in the range of 60% to 65%. Wall motion was normal; there were no regional wall motion abnormalities. Doppler parameters are consistent with abnormal left ventricular relaxation (grade 1 diastolic dysfunction). - Left atrium: The atrium was mildly dilated.   ASSESSMENT AND PLAN  Kevin [name] is a 81 y.o. male with a history of tobacco abuse, HTN, HLD, CKD and CAD s/p DES to LAD (2014) who presented to [facility] on 10/06/14 with chest pain concerning for [place].   Unstable angina- neg MI. He underwent LHC yesterday which revealed patent LAD stent and stable disease from 2014 study. Patient had 5/10 chest pain during cath. Cardiac markers were recycled which returned slightly elevated at 0.14--> 0.53. Will continue to cycle and make sure trending downwards. Will also check DDimer to r/o PE.  -- 2D ECHO normal -- We will increase imdur from 30mg  to 60mg . Continue ASA, statin and BB.  CKD- creat improved to 1.56 from 1.64. Cont to monitor   Tobacco abuse- counseled on cessation.   Essential hypertension- stable on amlodipine 10mg , Lopressor 25mg  BID and hydralazine 100mg  TID  Hyperlipidemia- cont statin  Anemia of chronic disease- stable   Dispo- will transfer to tele and ambulate. If d dimer negative and troponin trending downwards and no further CP, possible D/C later today.   Judy Pimple PA-C  Pager [phone]

## 2014-10-11 NOTE — Discharge Instructions (Signed)

## 2014-10-11 NOTE — Progress Notes (Signed)
Pt D-Dimer 5.23, Dr. Grandville Silos notified. VSS. Pt asymptomatic.

## 2014-10-11 NOTE — Discharge Summary (Signed)
Discharge Summary   Patient ID: Kevin [name] MRN: [id number], DOB/AGE: 04/09/1930 81 y.o. Admit date: 10/06/2014 D/C date:     10/11/2014  Primary Cardiologist: Dr. Domenic Polite  Principal Problem:   Chest pain Active Problems:   CKD (chronic kidney disease) stage 4, GFR 15-29 ml/min   Tobacco abuse   Essential hypertension, benign   Hyperlipidemia   Anemia of chronic disease   CAD (coronary artery disease)    Admission Dates: 10/06/14-10/11/14 Discharge Diagnosis: Chest pain s/p LHC with stable CAD   HPI:  Kevin [name] is a 81 y.o. male with a history of tobacco abuse, HTN, HLD, CKD and CAD s/p DES to LAD (2014) who presented to [facility] on 10/06/14 with chest pain concerning for [place].   The night before admission, Kevin [name] developed right-sided chest pain he described as a pressure squeezing sensation. It radiated to his right arm. It made him a little short of breath. His symptoms resolved without intervention and he went to bed. The same pain woke him in the middle of the night. This pain was more severe, reaching a 9/10. It was associated with shortness of breath and diaphoresis. He was concerned so he went to [facility]. He was transferred to [facility] for on IV heparin and NTG for further management of chest pain concerning for unstable angina.    Hospital Course  Unstable angina- he ruled out for MI with negative cardiac enzymes. He underwent LHC 10/10/14 which revealed patent LAD stent and stable disease from 2014 study (report below). Patient had 5/10 chest pain during procedure. Cardiac markers were re-cycled which returned slightly elevated at 0.14--> 0.53--> 0.48. ? Coronary vasospasm?   -- Ddimer positive at 5.23 but CTA negative for PE.  -- 2D ECHO w/ normal EF 60-65%, no RWMA, G1DD. Mild LA dilation -- We will increase imdur from 30mg  to 60mg . Continue ASA, statin and BB. -- Will start empiric protonix therapy and GI referral as an outpatient ( patient's daughter requests this  )  CKD- creat improved to 1.56 from 1.64. He had a CTA to rule out PE today. Will get follow up blood work on Friday at [facility]. Holding ACE and diuretics until follow up with Dr. Domenic Polite in two weeks.  Tobacco abuse- counseled on cessation. Patient's daughter requests a nicotine patch be called in.  Essential hypertension- stable on amlodipine 10mg , Lopressor 25mg  BID and hydralazine 100mg  TID -- Will hold HCTZ and lisinopril until follow up with Dr. Domenic Polite  Hyperlipidemia- cont statin. Followed by Dr. Woody Seller   The patient has had an uncomplicated hospital course and is recovering well. The femoral catheter site is stable. He has been seen by Dr. Radford Pax today and deemed ready for discharge home. All follow-up appointments have been scheduled. Smoking cessation was disscussed in length. Discharge medications are listed below.   Discharge Vitals: Blood pressure 139/55, pulse 61, temperature 97.9 F (36.6 C), temperature source Oral, resp. rate 14, height 5\' 6"  (1.676 m), weight 198 lb 3.2 oz (89.903 kg), SpO2 100 %.  Labs: Lab Results  Component Value Date   WBC 8.8 10/11/2014   HGB 10.1* 10/11/2014   HCT 31.3* 10/11/2014   MCV 94.0 10/11/2014   PLT 133* 10/11/2014    Recent Labs Lab 10/07/14 0422  10/11/14 0348  NA 136  < > 137  K 5.4*  < > 5.0  CL 110  < > 113*  CO2 19*  < > 17*  BUN 23*  < >  20  CREATININE 1.60*  < > 1.56*  CALCIUM 8.6*  < > 8.4*  PROT 6.3*  --   --   BILITOT 0.4  --   --   ALKPHOS 64  --   --   ALT 15*  --   --   AST 17  --   --   GLUCOSE 97  < > 103*  < > = values in this interval not displayed.  Recent Labs  10/10/14 1837 10/10/14 2208 10/11/14 0348 10/11/14 1005  TROPONINI <0.03 0.14* 0.53* 0.48*   Lab Results  Component Value Date   CHOL 156 10/07/2014   HDL 38* 10/07/2014   LDLCALC 107* 10/07/2014   TRIG 56 10/07/2014   Lab Results  Component Value Date   DDIMER 5.23* 10/11/2014    Diagnostic Studies/Procedures    Ct Angio Chest Pe W/cm &/or Wo Cm  10/11/2014   CLINICAL DATA:  Chest pain, elevated D-dimer.  EXAM: CT ANGIOGRAPHY CHEST WITH CONTRAST  TECHNIQUE: Multidetector CT imaging of the chest was performed using the standard protocol during bolus administration of intravenous contrast. Multiplanar CT image reconstructions and MIPs were obtained to evaluate the vascular anatomy.  CONTRAST:  53mL OMNIPAQUE IOHEXOL 350 MG/ML SOLN  COMPARISON:  Chest x-ray 10/06/2014  FINDINGS: Trace bilateral pleural effusions. No filling defects in the pulmonary arteries to suggest pulmonary emboli. Minimal dependent atelectasis posteriorly in the lower lobes.  Heart is upper limits normal in size. Dense calcifications in the left anterior descending coronary artery. No mediastinal, hilar, or axillary adenopathy. Chest wall soft tissues are unremarkable. Imaging into the upper abdomen shows no acute findings.  No acute bony abnormality or focal bone lesion.  Review of the MIP images confirms the above findings.  IMPRESSION: Trace bilateral pleural effusions.  Coronary artery disease.  No evidence of pulmonary embolus.   Electronically Signed   By: Rolm Baptise M.D.   On: 10/11/2014 13:03    LHC 10/10/14 Conclusion    1. Mid RCA lesion, 100% stenosed. 2. Mid LAD to Dist LAD lesion, 10% stenosed. The lesion was previously treated with a stent (unknown type) . 3. 3rd Mrg lesion, 90% stenosed. 4. Dist RCA lesion, 100% stenosed. 5. 2nd Diag lesion, 75% stenosed. 6. Dist Cx-1 lesion, 75% stenosed. 7. Dist Cx-2 lesion, 65% stenosed.  Widely patent left anterior descending stent.  Total occlusion of the mid to distal RCA. Distal RCA is supplied by collaterals from the circumflex and LAD.  Highly diseased distal circumflex with a stable eccentric 75% stenosis proximal to the second obtuse marginal. There is diffuse disease beyond the second obtuse marginal and 90% stenosis and a small third obtuse marginal branch. This distal  circumflex territory as the source of collaterals to the occluded right coronary. The anatomy is unchanged from 2014.  LV function is not performed. LV end-diastolic pressure is 10 mmHg.  During 5/10 chest discomfort that developed at completion of the case, repeat angiography revealed identical anatomy to that noted above. RECOMMENDATIONS:  Re-cycle cardiac markers  Consider alternative explanations for the patient's chest discomfort, although the presence of ventricular ectopy during the pain is concerning. The absence of response to sublingual nitroglycerin is also unusual if we consider the discomfort could be ischemia related.  Per treating team     2D ECHO: 10/07/2014 LV EF: 60- 65% Study Conclusions - Left ventricle: The cavity size was normal. Wall thickness was increased in a pattern of mild LVH. Systolic function was normal. The estimated ejection  fraction was in the range of 60% to 65%. Wall motion was normal; there were no regional wall motion abnormalities. Doppler parameters are consistent with abnormal left ventricular relaxation (grade 1 diastolic dysfunction). - Left atrium: The atrium was mildly dilated.        Discharge Medications     Medication List    STOP taking these medications        hydrochlorothiazide 12.5 MG capsule  Commonly known as:  MICROZIDE     lisinopril 5 MG tablet  Commonly known as:  PRINIVIL,ZESTRIL      TAKE these medications        amLODipine 10 MG tablet  Commonly known as:  NORVASC  Take 10 mg by mouth daily.     aspirin EC 81 MG tablet  Take 81 mg by mouth daily.     atorvastatin 80 MG tablet  Commonly known as:  LIPITOR  TAKE ONE TABLET BY MOUTH AT BEDTIME     cholecalciferol 1000 UNITS tablet  Commonly known as:  VITAMIN D  Take 1,000 Units by mouth daily.     gemfibrozil 600 MG tablet  Commonly known as:  LOPID  Take 600 mg by mouth 2 (two) times daily before a meal.     hydrALAZINE 100 MG  tablet  Commonly known as:  APRESOLINE  Take 1 tablet (100 mg total) by mouth 3 (three) times daily.     iron polysaccharides 150 MG capsule  Commonly known as:  NIFEREX  Take 150 mg by mouth daily.     isosorbide mononitrate 60 MG 24 hr tablet  Commonly known as:  IMDUR  Take 1 tablet (60 mg total) by mouth daily.     metoprolol tartrate 25 MG tablet  Commonly known as:  LOPRESSOR  Take 1 tablet (25 mg total) by mouth 2 (two) times daily.     nicotine 14 mg/24hr patch  Commonly known as:  NICODERM CQ  Place 1 patch (14 mg total) onto the skin daily.     nitroGLYCERIN 0.4 MG SL tablet  Commonly known as:  NITROSTAT  Place 1 tablet (0.4 mg total) under the tongue every 5 (five) minutes as needed for chest pain (up to 3 doses).     pantoprazole 40 MG tablet  Commonly known as:  PROTONIX  Take 1 tablet (40 mg total) by mouth daily.     sodium bicarbonate 650 MG tablet  Take 1 tablet (650 mg total) by mouth 2 (two) times daily.        Disposition   The patient will be discharged in stable condition to home.  Follow-up Information    Follow up with Rozann Lesches, MD On 10/25/2014.   Specialty:  Cardiology   Why:  @ 3:20pm   Contact information:   [address] [place] [address] [phone]       Follow up with [place] On 10/13/2014.   Why:  Please go to [facility] to get some blood work to check you kidneys- Please bring the written prescription    Contact information:   [address] [place] [phone]       Follow up with Scarlette Shorts, MD On 11/30/2014.   Specialty:  Gastroenterology   Why:  @ 1:45 pm   Contact information:   [address] [place] [address] [phone]         Duration of Discharge Encounter: Greater than 30 minutes including physician and PA time.  Signed, [place]uto [place],  Allyah Heather R PA-C 10/11/2014, 3:20 PM

## 2014-10-11 NOTE — Progress Notes (Signed)
Attempted to give report to 3W nurse. Nurse is unavailable at this time. 2H number left with Network engineer. Will call 3W back in 10 min.

## 2014-10-11 NOTE — Progress Notes (Signed)
Discharge instructions and teaching reviewed with pt and pt family. Pt understands instructions.  VSS. Pt discharging home with daughter.

## 2014-10-12 ENCOUNTER — Telehealth: Payer: Self-pay | Admitting: Cardiovascular Disease

## 2014-10-12 NOTE — Telephone Encounter (Signed)
D/C phone call .Marland Kitchen Appt is on 10/20/14 at 3:20 w/ Dr. Domenic Polite @ the [facility] .  Thanks

## 2014-10-12 NOTE — Telephone Encounter (Signed)
TCM call  Reminded patient of his OV scheduled on 9/23 with Fr, Domenic Polite @ the [place] office/  On discharge instructions it mentions getting blood work done and take lab slips with him.  Patient does not have lab slips and no orders seen active in system.  Routed to [place] PA for blood work orders for patient.  Says he is suppose to go to [facility] on 9/16 to get some blood work to check you kidneys- Please bring written prescription.  Patient said he does not have a written prescription

## 2014-10-16 ENCOUNTER — Encounter: Payer: Self-pay | Admitting: Cardiovascular Disease

## 2014-10-20 ENCOUNTER — Encounter: Payer: Self-pay | Admitting: Cardiology

## 2014-10-20 ENCOUNTER — Ambulatory Visit (INDEPENDENT_AMBULATORY_CARE_PROVIDER_SITE_OTHER): Payer: Medicare Other | Admitting: Cardiology

## 2014-10-20 VITALS — BP 140/62 | HR 62 | Ht 66.0 in | Wt 192.0 lb

## 2014-10-20 DIAGNOSIS — N179 Acute kidney failure, unspecified: Secondary | ICD-10-CM

## 2014-10-20 DIAGNOSIS — I10 Essential (primary) hypertension: Secondary | ICD-10-CM

## 2014-10-20 DIAGNOSIS — N289 Disorder of kidney and ureter, unspecified: Secondary | ICD-10-CM

## 2014-10-20 DIAGNOSIS — I200 Unstable angina: Secondary | ICD-10-CM | POA: Diagnosis not present

## 2014-10-20 DIAGNOSIS — E785 Hyperlipidemia, unspecified: Secondary | ICD-10-CM

## 2014-10-20 DIAGNOSIS — I25119 Atherosclerotic heart disease of native coronary artery with unspecified angina pectoris: Secondary | ICD-10-CM | POA: Diagnosis not present

## 2014-10-20 DIAGNOSIS — N189 Chronic kidney disease, unspecified: Secondary | ICD-10-CM

## 2014-10-20 NOTE — Patient Instructions (Signed)
Your physician recommends that you continue on your current medications as directed. Please refer to the Current Medication list given to you today. You have been referred to Dr. Lowanda Foster. Your physician recommends that you schedule a follow-up appointment in: 3 months.

## 2014-10-20 NOTE — Progress Notes (Signed)
Cardiology Office Note  Date: 10/20/2014   ID: Kevin [name], DOB 1930/11/13, MRN [id number]  PCP: Glenda Chroman., MD  Primary Cardiologist: Rozann Lesches, MD   Chief Complaint  Patient presents with  . Hospitalization Follow-up    History of Present Illness: Kevin [name] is an 81 y.o. male last seen in June. Record review finds hospitalization just recently with chest pain. He was transferred from [facility] to [facility], had mildly abnormal troponin I levels peaking at 0.53, and underwent a follow-up cardiac catheterization on September 13 revealing patent LAD stent site with otherwise stable residual disease. Medical therapy was recommended. Chest CT angiogram was negative for pulmonary embolus, and echocardiogram showed normal LVEF of 60-65% without wall motion abnormalities. Imdur dose was increased, and he was also started on a PPI.  He comes in today for a follow-up visit. States that he has not gotten his energy back yet. His chest pain has significantly improved. Still feels weak and has decreased appetite. I reviewed his recent lab work, he has acute on chronic renal insufficiency with creatinine up to 2.4. Now off HCTZ and lisinopril. Suspect that this could be related to recent contrast nephropathy.  We reviewed his medications in detail.  Past Medical History  Diagnosis Date  . Essential hypertension, benign   . CKD (chronic kidney disease), stage IV   . Tobacco abuse   . Claudication     R leg with bilateral femoral bruits.  Marland Kitchen CAD (coronary artery disease)     a. s/p DES to LAD (2014)  b. s/p LHC (09/2014) with patent LAD stent and stable dz from previous cath   . Dyslipidemia   . Chronic anemia     Past Surgical History  Procedure Laterality Date  . Bilateral carpal tunnel release    . Left heart catheterization with coronary angiogram N/A 02/09/2012    Procedure: LEFT HEART CATHETERIZATION WITH CORONARY ANGIOGRAM;  Surgeon: Peter M Martinique, MD; left  main 10%, LAD 80%/95%, D1 patent, CFX 70% at OM 2, dCFX 90%, RCA 100% with L-R collaterals    . Percutaneous coronary stent intervention (pci-s) N/A 02/11/2012    Procedure: PERCUTANEOUS CORONARY STENT INTERVENTION (PCI-S);  Surgeon: Burnell Blanks, MD; 3.0 x 28 mm Promus Premier DES to the mid LAD   . Cardiac catheterization N/A 10/10/2014    Procedure: Left Heart Cath and Coronary Angiography;  Surgeon: Belva Crome, MD;  Location: [facility];  Service: Cardiovascular;  Laterality: N/A;    Current Outpatient Prescriptions  Medication Sig Dispense Refill  . amLODipine (NORVASC) 10 MG tablet Take 10 mg by mouth daily.    Marland Kitchen aspirin EC 81 MG tablet Take 81 mg by mouth daily.    Marland Kitchen atorvastatin (LIPITOR) 80 MG tablet TAKE ONE TABLET BY MOUTH AT BEDTIME 30 tablet 6  . Cholecalciferol (VITAMIN D-3) 1000 UNITS CAPS Take 1,000 Units by mouth.    . hydrALAZINE (APRESOLINE) 100 MG tablet Take 1 tablet (100 mg total) by mouth 3 (three) times daily. 90 tablet 1  . iron polysaccharides (NIFEREX) 150 MG capsule Take 150 mg by mouth daily.    . isosorbide mononitrate (IMDUR) 60 MG 24 hr tablet Take 1 tablet (60 mg total) by mouth daily. 30 tablet 3  . metoprolol tartrate (LOPRESSOR) 25 MG tablet Take 1 tablet (25 mg total) by mouth 2 (two) times daily. 60 tablet 6  . nitroGLYCERIN (NITROSTAT) 0.4 MG SL tablet Place 1 tablet (0.4 mg total)  under the tongue every 5 (five) minutes as needed for chest pain (up to 3 doses). 25 tablet 4  . pantoprazole (PROTONIX) 40 MG tablet Take 1 tablet (40 mg total) by mouth daily. 60 tablet 3  . sodium bicarbonate 650 MG tablet Take 1 tablet (650 mg total) by mouth 2 (two) times daily. 60 tablet 0   No current facility-administered medications for this visit.    Allergies:  Review of patient's allergies indicates no known allergies.   Social History: The patient  reports that he quit smoking about 2 years ago. His smoking use included Cigarettes. He started  smoking about 46 years ago. He has a 22.5 pack-year smoking history. He has never used smokeless tobacco. He reports that he does not drink alcohol or use illicit drugs.   ROS:  Please see the history of present illness. Otherwise, complete review of systems is positive for none.  All other systems are reviewed and negative.   Physical Exam: VS:  BP 140/62 mmHg  Pulse 62  Ht 5\' 6"  (1.676 m)  Wt 192 lb (87.091 kg)  BMI 31.00 kg/m2  SpO2 98%, BMI Body mass index is 31 kg/(m^2).  Wt Readings from Last 3 Encounters:  10/20/14 192 lb (87.091 kg)  10/11/14 198 lb 3.2 oz (89.903 kg)  06/28/14 196 lb (88.905 kg)     Appears comfortable at rest.  HEENT: Conjunctiva and lids normal, oropharynx clear.  Neck: Supple, no elevated JVP or carotid bruits, no thyromegaly.  Lungs: Clear to auscultation, nonlabored breathing at rest.  Cardiac: Regular rate and rhythm, no S3 or significant systolic murmur, no pericardial rub.  Abdomen: Soft, nontender, bowel sounds present.  Extremities: Mild edema in his hands, distal pulses 1+.  Skin: Warm and dry. Musculoskeletal: No kyphosis. Neuropsychiatric: Alert and oriented 3, affect appropriate.   ECG: Tracing from 10/10/2014 showed sinus rhythm with short PR interval and nonspecific ST changes.   Recent Labwork: 10/06/2014: TSH 1.818 10/07/2014: ALT 15*; AST 17 10/11/2014: BUN 20; Creatinine, Ser 1.56*; Hemoglobin 10.1*; Platelets 133*; Potassium 5.0; Sodium 137     Component Value Date/Time   CHOL 156 10/07/2014 0422   TRIG 56 10/07/2014 0422   HDL 38* 10/07/2014 0422   CHOLHDL 4.1 10/07/2014 0422   VLDL 11 10/07/2014 0422   LDLCALC 107* 10/07/2014 0422   10/13/2014: BUN 30, creatinine 2.4, potassium 4.8  Other Studies Reviewed Today:  Echocardiogram 10/07/2014: Study Conclusions  - Left ventricle: The cavity size was normal. Wall thickness was increased in a pattern of mild LVH. Systolic function was normal. The estimated ejection  fraction was in the range of 60% to 65%. Wall motion was normal; there were no regional wall motion abnormalities. Doppler parameters are consistent with abnormal left ventricular relaxation (grade 1 diastolic dysfunction). - Left atrium: The atrium was mildly dilated.  Cardiac catheterization 10/10/2014: 1. Mid RCA lesion, 100% stenosed. 2. Mid LAD to Dist LAD lesion, 10% stenosed. The lesion was previously treated with a stent (unknown type) . 3. 3rd Mrg lesion, 90% stenosed. 4. Dist RCA lesion, 100% stenosed. 5. 2nd Diag lesion, 75% stenosed. 6. Dist Cx-1 lesion, 75% stenosed. 7. Dist Cx-2 lesion, 65% stenosed.   Widely patent left anterior descending stent.  Total occlusion of the mid to distal RCA. Distal RCA is supplied by collaterals from the circumflex and LAD.  Highly diseased distal circumflex with a stable eccentric 75% stenosis proximal to the second obtuse marginal. There is diffuse disease beyond the second obtuse marginal and  90% stenosis and a small third obtuse marginal branch. This distal circumflex territory as the source of collaterals to the occluded right coronary. The anatomy is unchanged from 2014.  LV function is not performed. LV end-diastolic pressure is 10 mmHg.  During 5/10 chest discomfort that developed at completion of the case, repeat angiography revealed identical anatomy to that noted above.  Assessment and Plan:  1. Ischemic heart disease with recent cardiac catheterization showing stable coronary anatomy. He has known occlusion of the RCA with patent stent site in the LAD with other branch vessel disease that is to be managed medically. Chest pain symptoms have improved.  2. Essential hypertension, recent medication adjustments have been made.  3. Acute on chronic renal insufficiency with creatinine up to 2.4, suspect contrast nephropathy. He is off HCTZ and lisinopril for now. Office follow-up being arranged with Dr. Lowanda Foster who has been  following him.  4. Hyperlipidemia, on Lipitor.  Current medicines were reviewed with the patient today.  Disposition: FU with me in 3 months.   Signed, Satira Sark, MD, [facility] 10/20/2014 3:52 PM    [facility] [place], [place], [place] [address] Phone: [phone]; Fax: [phone]

## 2014-11-06 ENCOUNTER — Other Ambulatory Visit: Payer: Self-pay | Admitting: Cardiology

## 2014-11-23 ENCOUNTER — Other Ambulatory Visit: Payer: Self-pay | Admitting: Physician Assistant

## 2014-11-30 ENCOUNTER — Encounter: Payer: Self-pay | Admitting: Internal Medicine

## 2014-11-30 ENCOUNTER — Ambulatory Visit (INDEPENDENT_AMBULATORY_CARE_PROVIDER_SITE_OTHER): Payer: Medicare Other | Admitting: Internal Medicine

## 2014-11-30 VITALS — BP 130/60 | HR 60 | Ht 65.25 in | Wt 200.1 lb

## 2014-11-30 DIAGNOSIS — I200 Unstable angina: Secondary | ICD-10-CM | POA: Diagnosis not present

## 2014-11-30 DIAGNOSIS — R0789 Other chest pain: Secondary | ICD-10-CM | POA: Diagnosis not present

## 2014-11-30 DIAGNOSIS — I25111 Atherosclerotic heart disease of native coronary artery with angina pectoris with documented spasm: Secondary | ICD-10-CM

## 2014-11-30 NOTE — Progress Notes (Signed)
HISTORY OF PRESENT ILLNESS:  Kevin [name] is a 81 y.o. male with multiple medical problems as listed below including coronary artery disease who is referred by his cardiology team (at the request of his daughter) regarding GERD as a possible cause for recent chest pain. I have reviewed the hospital record, laboratories, and imaging studies personally. The patient was hospitalized September 9 through 10/11/2014 after presenting with severe chest pain (wheezing) with radiation into the arm, diaphoresis, and shortness of breath. The patient states that this was similar to the pain that he had in January 2014 when he required a stent in his LAD. He reports having had no recurrent problems with pain whatsoever until September 9. He ruled out for myocardial infarction. He underwent left heart catheterization which revealed patent stent and stable disease. He did have chest pain with transient elevation of cardiac markers. Positive d-dimer noted but CTA negative for PE. 2-D echo revealed ejection fraction of 60-65%. It was decided to increase his Imdur from 30 mg to 60 mg. He was told to continue baby aspirin, statin, and beta blocker. He was empirically started on Protonix and this referral made. Patient denies a history of GI problems. He denies any issues with indigestion or heartburn. No dysphagia or abdominal pain. Regular bowel habits. He has not had recurrence of pain since his hospital discharge.  REVIEW OF SYSTEMS:  All non-GI ROS negative except for arthritis  Past Medical History  Diagnosis Date  . Essential hypertension, benign   . CKD (chronic kidney disease), stage IV ([place])   . Tobacco abuse   . Claudication ([place])     R leg with bilateral femoral bruits.  Marland Kitchen CAD (coronary artery disease)     a. s/p DES to LAD (2014)  b. s/p LHC (09/2014) with patent LAD stent and stable dz from previous cath   . Dyslipidemia   . Chronic anemia   . Arthritis     Past Surgical History  Procedure Laterality  Date  . Bilateral carpal tunnel release Bilateral   . Left heart catheterization with coronary angiogram N/A 02/09/2012    Procedure: LEFT HEART CATHETERIZATION WITH CORONARY ANGIOGRAM;  Surgeon: Peter M Martinique, MD; left main 10%, LAD 80%/95%, D1 patent, CFX 70% at OM 2, dCFX 90%, RCA 100% with L-R collaterals    . Percutaneous coronary stent intervention (pci-s) N/A 02/11/2012    Procedure: PERCUTANEOUS CORONARY STENT INTERVENTION (PCI-S);  Surgeon: Burnell Blanks, MD; 3.0 x 28 mm Promus Premier DES to the mid LAD   . Cardiac catheterization N/A 10/10/2014    Procedure: Left Heart Cath and Coronary Angiography;  Surgeon: Belva Crome, MD;  Location: [facility];  Service: Cardiovascular;  Laterality: N/A;    Social History Khalil A Candelas  reports that he quit smoking about 2 years ago. His smoking use included Cigarettes. He started smoking about 46 years ago. He has a 22.5 pack-year smoking history. He has never used smokeless tobacco. He reports that he does not drink alcohol or use illicit drugs.  family history includes Alzheimer's disease in his mother; Heart disease in his brother and sister; Hypertension in his mother; Other in his father.  No Known Allergies     PHYSICAL EXAMINATION: Vital signs: BP 130/60 mmHg  Pulse 60  Ht 5' 5.25" (1.657 m)  Wt 200 lb 2 oz (90.776 kg)  BMI 33.06 kg/m2  Constitutional: generally well-appearing, no acute distress Psychiatric: alert and oriented x3, cooperative Eyes: extraocular movements intact, anicteric,  conjunctiva pink Mouth: oral pharynx moist, no lesions Neck: supple without thyromegaly; , no lymphadenopathy Cardiovascular: heart regular rate and rhythm, no murmur Lungs: clear to auscultation bilaterally Abdomen: soft, obese, nontender, nondistended, no obvious ascites, no peritoneal signs, normal bowel sounds, no organomegaly Rectal: Omitted Extremities: no cyanosis or lower extremity edema bilaterally Skin: no lesions  on visible extremities Neuro: No focal deficits. Cranial nerves intact.  ASSESSMENT:  #1. Recent problems with chest pain. Isolated and similar to prior cardiac chest pain. This seems most likely a subclinical cardiac event. Fortunately negative extensive workup as described. In any event, this is highly unlikely GI. Certainly not GERD.  PLAN:  #1. I advised him to discontinue PPI #2. I told him if he were to develop recurrent problems with chest pain, he should report to the hospital to be evaluated for cardiac cause #3. He will return to the care of his PCP and cardiologist. GI follow-up as needed

## 2014-11-30 NOTE — Patient Instructions (Signed)
Please follow up as needed 

## 2014-12-18 ENCOUNTER — Ambulatory Visit: Payer: Medicare Other | Admitting: Cardiology

## 2015-01-24 ENCOUNTER — Encounter: Payer: Self-pay | Admitting: Cardiology

## 2015-01-24 ENCOUNTER — Ambulatory Visit (INDEPENDENT_AMBULATORY_CARE_PROVIDER_SITE_OTHER): Payer: Medicare Other | Admitting: Cardiology

## 2015-01-24 VITALS — BP 149/68 | HR 48 | Ht 66.0 in | Wt 200.0 lb

## 2015-01-24 DIAGNOSIS — N184 Chronic kidney disease, stage 4 (severe): Secondary | ICD-10-CM

## 2015-01-24 DIAGNOSIS — R001 Bradycardia, unspecified: Secondary | ICD-10-CM | POA: Diagnosis not present

## 2015-01-24 DIAGNOSIS — I2510 Atherosclerotic heart disease of native coronary artery without angina pectoris: Secondary | ICD-10-CM

## 2015-01-24 DIAGNOSIS — I200 Unstable angina: Secondary | ICD-10-CM | POA: Diagnosis not present

## 2015-01-24 MED ORDER — METOPROLOL TARTRATE 25 MG PO TABS: 12.5000 mg | ORAL_TABLET | Freq: Two times a day (BID) | ORAL | Status: DC

## 2015-01-24 NOTE — Patient Instructions (Signed)
Your physician wants you to follow-up in: [place] DR. Domenic Polite You will receive a reminder letter in the mail two months in advance. If you don't receive a letter, please call our office to schedule the follow-up appointment.  Your physician has recommended you make the following change in your medication:   DECREASE LOPRESSOR 12.5 MG TWICE DAILY  Thank you for choosing [facility]!!

## 2015-01-24 NOTE — Progress Notes (Signed)
Cardiology Office Note  Date: 01/24/2015   ID: Kevin [name], DOB 11-13-1930, MRN [id number]  PCP: Glenda Chroman., MD  Primary Cardiologist: Rozann Lesches, MD   Chief Complaint  Patient presents with  . Coronary Artery Disease    History of Present Illness: Kevin [name] is an 81 y.o. male last seen in September. He presents for a routine follow-up visit. Since last encounter, he states that he has been doing reasonably well, no significant recurring angina on medical therapy. His recent cardiac catheterization results are outlined below.  We reviewed his medications. With significant bradycardia we will plan to reduce his Lopressor to 12.5 mg twice daily, may even need to stop it altogether depending on how he does.  Past Medical History  Diagnosis Date  . Essential hypertension, benign   . CKD (chronic kidney disease), stage IV ([place])   . Tobacco abuse   . Claudication ([place])     R leg with bilateral femoral bruits.  Marland Kitchen CAD (coronary artery disease)     a. s/p DES to LAD (2014)  b. s/p LHC (09/2014) with patent LAD stent and stable dz from previous cath   . Dyslipidemia   . Chronic anemia   . Arthritis     Current Outpatient Prescriptions  Medication Sig Dispense Refill  . amLODipine (NORVASC) 10 MG tablet Take 10 mg by mouth daily.    Marland Kitchen aspirin EC 81 MG tablet Take 81 mg by mouth daily.    Marland Kitchen atorvastatin (LIPITOR) 80 MG tablet TAKE ONE TABLET BY MOUTH AT BEDTIME 30 tablet 3  . Cholecalciferol (VITAMIN D-3) 1000 UNITS CAPS Take 1,000 Units by mouth daily.     . hydrALAZINE (APRESOLINE) 100 MG tablet TAKE ONE TABLET BY MOUTH THREE TIMES DAILY. 90 tablet 5  . iron polysaccharides (NIFEREX) 150 MG capsule Take 150 mg by mouth daily.    . isosorbide mononitrate (IMDUR) 60 MG 24 hr tablet Take 1 tablet (60 mg total) by mouth daily. 30 tablet 3  . metoprolol tartrate (LOPRESSOR) 25 MG tablet Take 1 tablet (25 mg total) by mouth 2 (two) times daily. 60 tablet 6  . nitroGLYCERIN  (NITROSTAT) 0.4 MG SL tablet Place 1 tablet (0.4 mg total) under the tongue every 5 (five) minutes as needed for chest pain (up to 3 doses). 25 tablet 4  . sodium bicarbonate 650 MG tablet Take 1 tablet (650 mg total) by mouth 2 (two) times daily. 60 tablet 0   No current facility-administered medications for this visit.   Allergies:  Review of patient's allergies indicates no known allergies.   Social History: The patient  reports that he quit smoking about 2 years ago. His smoking use included Cigarettes. He started smoking about 47 years ago. He has a 22.5 pack-year smoking history. He has never used smokeless tobacco. He reports that he does not drink alcohol or use illicit drugs.   ROS:  Please see the history of present illness. Otherwise, complete review of systems is positive for generalized fatigue.  All other systems are reviewed and negative.   Physical Exam: VS:  BP 149/68 mmHg  Pulse 48  Ht 5\' 6"  (1.676 m)  Wt 200 lb (90.719 kg)  BMI 32.30 kg/m2  SpO2 98%, BMI Body mass index is 32.3 kg/(m^2).  Wt Readings from Last 3 Encounters:  01/24/15 200 lb (90.719 kg)  11/30/14 200 lb 2 oz (90.776 kg)  10/20/14 192 lb (87.091 kg)    Appears comfortable at rest.  HEENT: Conjunctiva and lids normal, oropharynx clear.  Neck: Supple, no elevated JVP or carotid bruits, no thyromegaly.  Lungs: Clear to auscultation, nonlabored breathing at rest.  Cardiac: Regular rate and rhythm, no S3 or significant systolic murmur, no pericardial rub.  Abdomen: Soft, nontender, bowel sounds present.  Extremities: Mild edema in his hands, distal pulses 1+.   ECG:  Tracing from 10/10/2014 showed sinus rhythm with short PR interval and nonspecific ST changes.  Recent Labwork: 10/06/2014: TSH 1.818 10/07/2014: ALT 15*; AST 17 10/11/2014: BUN 20; Creatinine, Ser 1.56*; Hemoglobin 10.1*; Platelets 133*; Potassium 5.0; Sodium 137     Component Value Date/Time   CHOL 156 10/07/2014 0422   TRIG 56  10/07/2014 0422   HDL 38* 10/07/2014 0422   CHOLHDL 4.1 10/07/2014 0422   VLDL 11 10/07/2014 0422   LDLCALC 107* 10/07/2014 0422    Other Studies Reviewed Today:  Echocardiogram 10/07/2014: Study Conclusions  - Left ventricle: The cavity size was normal. Wall thickness was increased in a pattern of mild LVH. Systolic function was normal. The estimated ejection fraction was in the range of 60% to 65%. Wall motion was normal; there were no regional wall motion abnormalities. Doppler parameters are consistent with abnormal left ventricular relaxation (grade 1 diastolic dysfunction). - Left atrium: The atrium was mildly dilated.  Cardiac catheterization 10/10/2014: 1. Mid RCA lesion, 100% stenosed. 2. Mid LAD to Dist LAD lesion, 10% stenosed. The lesion was previously treated with a stent (unknown type) . 3. 3rd Mrg lesion, 90% stenosed. 4. Dist RCA lesion, 100% stenosed. 5. 2nd Diag lesion, 75% stenosed. 6. Dist Cx-1 lesion, 75% stenosed. 7. Dist Cx-2 lesion, 65% stenosed.   Widely patent left anterior descending stent.  Total occlusion of the mid to distal RCA. Distal RCA is supplied by collaterals from the circumflex and LAD.  Highly diseased distal circumflex with a stable eccentric 75% stenosis proximal to the second obtuse marginal. There is diffuse disease beyond the second obtuse marginal and 90% stenosis and a small third obtuse marginal branch. This distal circumflex territory as the source of collaterals to the occluded right coronary. The anatomy is unchanged from 2014.  LV function is not performed. LV end-diastolic pressure is 10 mmHg.  During 5/10 chest discomfort that developed at completion of the case, repeat angiography revealed identical anatomy to that noted above.  Assessment and Plan:  1. Symptomatically stable CAD with known occlusion of the RCA and patent stent within the LAD. Other branch vessel disease is being managed medically. We will  continue current regimen except decrease Lopressor to 12.5 mg twice daily given significant bradycardia. May need to stop altogether depending on how he does.  2. Essential hypertension, no changes made to current regimen.  3. CK D stage 3-4. Keep follow-up with Dr. Lowanda Foster.  Current medicines were reviewed with the patient today.  Disposition: FU with me in 6 months.   Signed, Satira Sark, MD, [facility] 01/24/2015 3:37 PM    [facility] [place], [place], [place] [address] Phone: [phone]; Fax: [phone]

## 2015-02-19 ENCOUNTER — Other Ambulatory Visit: Payer: Self-pay | Admitting: Physician Assistant

## 2015-03-01 DIAGNOSIS — I129 Hypertensive chronic kidney disease with stage 1 through stage 4 chronic kidney disease, or unspecified chronic kidney disease: Secondary | ICD-10-CM | POA: Diagnosis not present

## 2015-03-01 DIAGNOSIS — D509 Iron deficiency anemia, unspecified: Secondary | ICD-10-CM | POA: Diagnosis not present

## 2015-03-01 DIAGNOSIS — D649 Anemia, unspecified: Secondary | ICD-10-CM | POA: Diagnosis not present

## 2015-03-01 DIAGNOSIS — Z79891 Long term (current) use of opiate analgesic: Secondary | ICD-10-CM | POA: Diagnosis not present

## 2015-03-01 DIAGNOSIS — E559 Vitamin D deficiency, unspecified: Secondary | ICD-10-CM | POA: Diagnosis not present

## 2015-03-01 DIAGNOSIS — R803 Bence Jones proteinuria: Secondary | ICD-10-CM | POA: Diagnosis not present

## 2015-03-01 DIAGNOSIS — N183 Chronic kidney disease, stage 3 (moderate): Secondary | ICD-10-CM | POA: Diagnosis not present

## 2015-03-02 DIAGNOSIS — E559 Vitamin D deficiency, unspecified: Secondary | ICD-10-CM | POA: Diagnosis not present

## 2015-03-02 DIAGNOSIS — D509 Iron deficiency anemia, unspecified: Secondary | ICD-10-CM | POA: Diagnosis not present

## 2015-03-02 DIAGNOSIS — I129 Hypertensive chronic kidney disease with stage 1 through stage 4 chronic kidney disease, or unspecified chronic kidney disease: Secondary | ICD-10-CM | POA: Diagnosis not present

## 2015-03-02 DIAGNOSIS — D649 Anemia, unspecified: Secondary | ICD-10-CM | POA: Diagnosis not present

## 2015-03-02 DIAGNOSIS — N183 Chronic kidney disease, stage 3 (moderate): Secondary | ICD-10-CM | POA: Diagnosis not present

## 2015-03-02 DIAGNOSIS — R803 Bence Jones proteinuria: Secondary | ICD-10-CM | POA: Diagnosis not present

## 2015-03-07 DIAGNOSIS — I10 Essential (primary) hypertension: Secondary | ICD-10-CM | POA: Diagnosis not present

## 2015-03-07 DIAGNOSIS — E875 Hyperkalemia: Secondary | ICD-10-CM | POA: Diagnosis not present

## 2015-03-07 DIAGNOSIS — E872 Acidosis: Secondary | ICD-10-CM | POA: Diagnosis not present

## 2015-03-07 DIAGNOSIS — N183 Chronic kidney disease, stage 3 (moderate): Secondary | ICD-10-CM | POA: Diagnosis not present

## 2015-03-07 DIAGNOSIS — R809 Proteinuria, unspecified: Secondary | ICD-10-CM | POA: Diagnosis not present

## 2015-03-15 ENCOUNTER — Ambulatory Visit (INDEPENDENT_AMBULATORY_CARE_PROVIDER_SITE_OTHER): Payer: Medicare Other | Admitting: Otolaryngology

## 2015-03-15 DIAGNOSIS — T161XXA Foreign body in right ear, initial encounter: Secondary | ICD-10-CM

## 2015-04-27 DIAGNOSIS — I10 Essential (primary) hypertension: Secondary | ICD-10-CM | POA: Diagnosis not present

## 2015-04-27 DIAGNOSIS — I2510 Atherosclerotic heart disease of native coronary artery without angina pectoris: Secondary | ICD-10-CM | POA: Diagnosis not present

## 2015-04-27 DIAGNOSIS — E7800 Pure hypercholesterolemia, unspecified: Secondary | ICD-10-CM | POA: Diagnosis not present

## 2015-05-08 DIAGNOSIS — M25551 Pain in right hip: Secondary | ICD-10-CM | POA: Diagnosis not present

## 2015-05-08 DIAGNOSIS — H6981 Other specified disorders of Eustachian tube, right ear: Secondary | ICD-10-CM | POA: Diagnosis not present

## 2015-05-30 DIAGNOSIS — I2510 Atherosclerotic heart disease of native coronary artery without angina pectoris: Secondary | ICD-10-CM | POA: Diagnosis not present

## 2015-05-30 DIAGNOSIS — E7800 Pure hypercholesterolemia, unspecified: Secondary | ICD-10-CM | POA: Diagnosis not present

## 2015-05-30 DIAGNOSIS — I10 Essential (primary) hypertension: Secondary | ICD-10-CM | POA: Diagnosis not present

## 2015-06-21 DIAGNOSIS — D509 Iron deficiency anemia, unspecified: Secondary | ICD-10-CM | POA: Diagnosis not present

## 2015-06-21 DIAGNOSIS — R803 Bence Jones proteinuria: Secondary | ICD-10-CM | POA: Diagnosis not present

## 2015-06-21 DIAGNOSIS — D649 Anemia, unspecified: Secondary | ICD-10-CM | POA: Diagnosis not present

## 2015-06-21 DIAGNOSIS — I129 Hypertensive chronic kidney disease with stage 1 through stage 4 chronic kidney disease, or unspecified chronic kidney disease: Secondary | ICD-10-CM | POA: Diagnosis not present

## 2015-06-21 DIAGNOSIS — N183 Chronic kidney disease, stage 3 (moderate): Secondary | ICD-10-CM | POA: Diagnosis not present

## 2015-06-21 DIAGNOSIS — E859 Amyloidosis, unspecified: Secondary | ICD-10-CM | POA: Diagnosis not present

## 2015-06-21 DIAGNOSIS — Z79891 Long term (current) use of opiate analgesic: Secondary | ICD-10-CM | POA: Diagnosis not present

## 2015-06-27 DIAGNOSIS — I10 Essential (primary) hypertension: Secondary | ICD-10-CM | POA: Diagnosis not present

## 2015-06-27 DIAGNOSIS — N183 Chronic kidney disease, stage 3 (moderate): Secondary | ICD-10-CM | POA: Diagnosis not present

## 2015-06-27 DIAGNOSIS — R809 Proteinuria, unspecified: Secondary | ICD-10-CM | POA: Diagnosis not present

## 2015-06-27 DIAGNOSIS — E559 Vitamin D deficiency, unspecified: Secondary | ICD-10-CM | POA: Diagnosis not present

## 2015-07-03 DIAGNOSIS — E7800 Pure hypercholesterolemia, unspecified: Secondary | ICD-10-CM | POA: Diagnosis not present

## 2015-07-03 DIAGNOSIS — I2510 Atherosclerotic heart disease of native coronary artery without angina pectoris: Secondary | ICD-10-CM | POA: Diagnosis not present

## 2015-07-03 DIAGNOSIS — I10 Essential (primary) hypertension: Secondary | ICD-10-CM | POA: Diagnosis not present

## 2015-07-30 ENCOUNTER — Encounter: Payer: Self-pay | Admitting: Cardiology

## 2015-07-30 ENCOUNTER — Ambulatory Visit (INDEPENDENT_AMBULATORY_CARE_PROVIDER_SITE_OTHER): Payer: Medicare Other | Admitting: Cardiology

## 2015-07-30 VITALS — BP 134/65 | HR 56 | Ht 66.0 in | Wt 199.4 lb

## 2015-07-30 DIAGNOSIS — I10 Essential (primary) hypertension: Secondary | ICD-10-CM | POA: Diagnosis not present

## 2015-07-30 DIAGNOSIS — I25119 Atherosclerotic heart disease of native coronary artery with unspecified angina pectoris: Secondary | ICD-10-CM | POA: Diagnosis not present

## 2015-07-30 DIAGNOSIS — E785 Hyperlipidemia, unspecified: Secondary | ICD-10-CM | POA: Diagnosis not present

## 2015-07-30 MED ORDER — NITROGLYCERIN 0.4 MG SL SUBL
0.4000 mg | SUBLINGUAL_TABLET | SUBLINGUAL | Status: DC | PRN
Start: 2015-07-30 — End: 2017-06-15

## 2015-07-30 NOTE — Patient Instructions (Signed)

## 2015-07-30 NOTE — Progress Notes (Signed)
Cardiology Office Note  Date: 07/30/2015   ID: Kevin [name], DOB [birth year], MRN [id number]  PCP: Glenda Chroman, MD  Primary Cardiologist: Rozann Lesches, MD   Chief Complaint  Patient presents with  . Coronary Artery Disease    History of Present Illness: Kevin [name] is an 82 y.o. male last seen in December 2016. He presents for a routine follow-up visit. Continues to do well without active angina symptoms. He has not needed any nitroglycerin, has an old bottle and needs a refill.  I reviewed his medications which are outlined below and stable from a cardiac perspective. He will be seeing Dr. Woody Seller in August for physical and lab work.  Cardiac testing from September 2016 is reviewed below, we continue medical therapy and observation with no recurring symptoms.  Past Medical History  Diagnosis Date  . Essential hypertension, benign   . CKD (chronic kidney disease), stage IV ([place])   . Tobacco abuse   . Claudication ([place])     R leg with bilateral femoral bruits.  Marland Kitchen CAD (coronary artery disease)     a. s/p DES to LAD (2014)  b. s/p LHC (09/2014) with patent LAD stent and stable dz from previous cath   . Dyslipidemia   . Chronic anemia   . Arthritis     Current Outpatient Prescriptions  Medication Sig Dispense Refill  . amLODipine (NORVASC) 10 MG tablet Take 10 mg by mouth daily.    Marland Kitchen aspirin EC 81 MG tablet Take 81 mg by mouth daily.    Marland Kitchen atorvastatin (LIPITOR) 80 MG tablet TAKE ONE TABLET BY MOUTH AT BEDTIME 30 tablet 3  . azelastine (ASTELIN) 0.1 % nasal spray USE ONE SPARY IN EACH NOSTRIL TWICE DAILY  3  . Cholecalciferol (VITAMIN D-3) 1000 UNITS CAPS Take 1,000 Units by mouth daily.     . hydrALAZINE (APRESOLINE) 100 MG tablet TAKE ONE TABLET BY MOUTH THREE TIMES DAILY. 90 tablet 5  . iron polysaccharides (NIFEREX) 150 MG capsule Take 150 mg by mouth daily.    . isosorbide mononitrate (IMDUR) 60 MG 24 hr tablet TAKE ONE TABLET BY MOUTH DAILY. 30 tablet 6  . lisinopril  (PRINIVIL,ZESTRIL) 5 MG tablet Take 5 mg by mouth daily.  99  . metoprolol tartrate (LOPRESSOR) 25 MG tablet Take 0.5 tablets (12.5 mg total) by mouth 2 (two) times daily. 90 tablet 3  . nitroGLYCERIN (NITROSTAT) 0.4 MG SL tablet Place 1 tablet (0.4 mg total) under the tongue every 5 (five) minutes as needed for chest pain (up to 3 doses). 25 tablet 4  . sodium bicarbonate 650 MG tablet Take 1 tablet (650 mg total) by mouth 2 (two) times daily. 60 tablet 0   No current facility-administered medications for this visit.   Allergies:  Review of patient's allergies indicates no known allergies.   Social History: The patient  reports that he quit smoking about 3 years ago. His smoking use included Cigarettes. He started smoking about 47 years ago. He has a 22.5 pack-year smoking history. He has never used smokeless tobacco. He reports that he does not drink alcohol or use illicit drugs.   ROS:  Please see the history of present illness. Otherwise, complete review of systems is positive for arthritis symptoms.  All other systems are reviewed and negative.   Physical Exam: VS:  BP 134/65 mmHg  Pulse 56  Ht 5\' 6"  (1.676 m)  Wt 199 lb 6.4 oz (90.447 kg)  BMI 32.20 kg/m2  SpO2 98%, BMI Body mass index is 32.2 kg/(m^2).  Wt Readings from Last 3 Encounters:  07/30/15 199 lb 6.4 oz (90.447 kg)  01/24/15 200 lb (90.719 kg)  11/30/14 200 lb 2 oz (90.776 kg)    Appears comfortable at rest.  HEENT: Conjunctiva and lids normal, oropharynx clear.  Neck: Supple, no elevated JVP or carotid bruits, no thyromegaly.  Lungs: Clear to auscultation, nonlabored breathing at rest.  Cardiac: Regular rate and rhythm, no S3 or significant systolic murmur, no pericardial rub.  Abdomen: Soft, nontender, bowel sounds present.  Extremities: Mild edema in his hands, distal pulses 1+.  ECG: I personally reviewed the tracing from 10/10/2014 which showed sinus rhythm with short PR interval and nonspecific ST  changes.  Recent Labwork: 10/06/2014: TSH 1.818 10/07/2014: ALT 15*; AST 17 10/11/2014: BUN 20; Creatinine, Ser 1.56*; Hemoglobin 10.1*; Platelets 133*; Potassium 5.0; Sodium 137     Component Value Date/Time   CHOL 156 10/07/2014 0422   TRIG 56 10/07/2014 0422   HDL 38* 10/07/2014 0422   CHOLHDL 4.1 10/07/2014 0422   VLDL 11 10/07/2014 0422   LDLCALC 107* 10/07/2014 0422    Other Studies Reviewed Today:  Echocardiogram 10/07/2014: Study Conclusions  - Left ventricle: The cavity size was normal. Wall thickness was increased in a pattern of mild LVH. Systolic function was normal. The estimated ejection fraction was in the range of 60% to 65%. Wall motion was normal; there were no regional wall motion abnormalities. Doppler parameters are consistent with abnormal left ventricular relaxation (grade 1 diastolic dysfunction). - Left atrium: The atrium was mildly dilated.  Cardiac catheterization 10/10/2014: 1. Mid RCA lesion, 100% stenosed. 2. Mid LAD to Dist LAD lesion, 10% stenosed. The lesion was previously treated with a stent (unknown type) . 3. 3rd Mrg lesion, 90% stenosed. 4. Dist RCA lesion, 100% stenosed. 5. 2nd Diag lesion, 75% stenosed. 6. Dist Cx-1 lesion, 75% stenosed. 7. Dist Cx-2 lesion, 65% stenosed.   Widely patent left anterior descending stent.  Total occlusion of the mid to distal RCA. Distal RCA is supplied by collaterals from the circumflex and LAD.  Highly diseased distal circumflex with a stable eccentric 75% stenosis proximal to the second obtuse marginal. There is diffuse disease beyond the second obtuse marginal and 90% stenosis and a small third obtuse marginal branch. This distal circumflex territory as the source of collaterals to the occluded right coronary. The anatomy is unchanged from 2014.  LV function is not performed. LV end-diastolic pressure is 10 mmHg.  During 5/10 chest discomfort that developed at completion of the case, repeat  angiography revealed identical anatomy to that noted above.  Assessment and Plan:  1. Symptomatically stable CAD with cardiac catheterization from September 2016 outlined above and plan for medical therapy in the absence of progressive symptoms. No changes were made today to current regimen.  2. Hyperlipidemia, on Lipitor. He will be having follow-up lab work with Dr. Woody Seller in August.  3. Essential hypertension, blood pressure control is adequate today.  Current medicines were reviewed with the patient today.  Disposition: Follow-up with me in 6 months.  Signed, Satira Sark, MD, [facility] 07/30/2015 1:26 PM    [facility] [place], [place], [place] [address] Phone: [phone]; Fax: [phone]

## 2015-08-01 DIAGNOSIS — J069 Acute upper respiratory infection, unspecified: Secondary | ICD-10-CM | POA: Diagnosis not present

## 2015-08-01 DIAGNOSIS — I10 Essential (primary) hypertension: Secondary | ICD-10-CM | POA: Diagnosis not present

## 2015-08-01 DIAGNOSIS — Z299 Encounter for prophylactic measures, unspecified: Secondary | ICD-10-CM | POA: Diagnosis not present

## 2015-08-01 DIAGNOSIS — H6981 Other specified disorders of Eustachian tube, right ear: Secondary | ICD-10-CM | POA: Diagnosis not present

## 2015-08-14 DIAGNOSIS — I2510 Atherosclerotic heart disease of native coronary artery without angina pectoris: Secondary | ICD-10-CM | POA: Diagnosis not present

## 2015-08-14 DIAGNOSIS — E7800 Pure hypercholesterolemia, unspecified: Secondary | ICD-10-CM | POA: Diagnosis not present

## 2015-08-14 DIAGNOSIS — I10 Essential (primary) hypertension: Secondary | ICD-10-CM | POA: Diagnosis not present

## 2015-09-05 DIAGNOSIS — Z1389 Encounter for screening for other disorder: Secondary | ICD-10-CM | POA: Diagnosis not present

## 2015-09-05 DIAGNOSIS — Z0000 Encounter for general adult medical examination without abnormal findings: Secondary | ICD-10-CM | POA: Diagnosis not present

## 2015-09-05 DIAGNOSIS — Z7189 Other specified counseling: Secondary | ICD-10-CM | POA: Diagnosis not present

## 2015-09-05 DIAGNOSIS — Z6831 Body mass index (BMI) 31.0-31.9, adult: Secondary | ICD-10-CM | POA: Diagnosis not present

## 2015-09-05 DIAGNOSIS — R5383 Other fatigue: Secondary | ICD-10-CM | POA: Diagnosis not present

## 2015-09-05 DIAGNOSIS — Z1211 Encounter for screening for malignant neoplasm of colon: Secondary | ICD-10-CM | POA: Diagnosis not present

## 2015-09-05 DIAGNOSIS — Z125 Encounter for screening for malignant neoplasm of prostate: Secondary | ICD-10-CM | POA: Diagnosis not present

## 2015-09-05 DIAGNOSIS — Z299 Encounter for prophylactic measures, unspecified: Secondary | ICD-10-CM | POA: Diagnosis not present

## 2015-09-05 DIAGNOSIS — E7800 Pure hypercholesterolemia, unspecified: Secondary | ICD-10-CM | POA: Diagnosis not present

## 2015-09-05 DIAGNOSIS — Z79899 Other long term (current) drug therapy: Secondary | ICD-10-CM | POA: Diagnosis not present

## 2015-10-23 DIAGNOSIS — I10 Essential (primary) hypertension: Secondary | ICD-10-CM | POA: Diagnosis not present

## 2015-10-23 DIAGNOSIS — I2510 Atherosclerotic heart disease of native coronary artery without angina pectoris: Secondary | ICD-10-CM | POA: Diagnosis not present

## 2015-10-23 DIAGNOSIS — E7800 Pure hypercholesterolemia, unspecified: Secondary | ICD-10-CM | POA: Diagnosis not present

## 2015-10-31 DIAGNOSIS — I2510 Atherosclerotic heart disease of native coronary artery without angina pectoris: Secondary | ICD-10-CM | POA: Diagnosis not present

## 2015-10-31 DIAGNOSIS — I10 Essential (primary) hypertension: Secondary | ICD-10-CM | POA: Diagnosis not present

## 2015-10-31 DIAGNOSIS — E7800 Pure hypercholesterolemia, unspecified: Secondary | ICD-10-CM | POA: Diagnosis not present

## 2015-11-01 DIAGNOSIS — D509 Iron deficiency anemia, unspecified: Secondary | ICD-10-CM | POA: Diagnosis not present

## 2015-11-01 DIAGNOSIS — R803 Bence Jones proteinuria: Secondary | ICD-10-CM | POA: Diagnosis not present

## 2015-11-01 DIAGNOSIS — I129 Hypertensive chronic kidney disease with stage 1 through stage 4 chronic kidney disease, or unspecified chronic kidney disease: Secondary | ICD-10-CM | POA: Diagnosis not present

## 2015-11-01 DIAGNOSIS — N183 Chronic kidney disease, stage 3 (moderate): Secondary | ICD-10-CM | POA: Diagnosis not present

## 2015-11-01 DIAGNOSIS — Z79891 Long term (current) use of opiate analgesic: Secondary | ICD-10-CM | POA: Diagnosis not present

## 2015-11-01 DIAGNOSIS — D649 Anemia, unspecified: Secondary | ICD-10-CM | POA: Diagnosis not present

## 2015-11-01 DIAGNOSIS — E559 Vitamin D deficiency, unspecified: Secondary | ICD-10-CM | POA: Diagnosis not present

## 2015-11-07 DIAGNOSIS — E872 Acidosis: Secondary | ICD-10-CM | POA: Diagnosis not present

## 2015-11-07 DIAGNOSIS — E875 Hyperkalemia: Secondary | ICD-10-CM | POA: Diagnosis not present

## 2015-11-07 DIAGNOSIS — N250 Renal osteodystrophy: Secondary | ICD-10-CM | POA: Diagnosis not present

## 2015-11-07 DIAGNOSIS — I10 Essential (primary) hypertension: Secondary | ICD-10-CM | POA: Diagnosis not present

## 2015-11-07 DIAGNOSIS — R809 Proteinuria, unspecified: Secondary | ICD-10-CM | POA: Diagnosis not present

## 2015-11-07 DIAGNOSIS — N183 Chronic kidney disease, stage 3 (moderate): Secondary | ICD-10-CM | POA: Diagnosis not present

## 2015-12-04 ENCOUNTER — Inpatient Hospital Stay (HOSPITAL_COMMUNITY): Payer: Medicare Other

## 2015-12-04 ENCOUNTER — Encounter (HOSPITAL_COMMUNITY): Payer: Self-pay | Admitting: Family Medicine

## 2015-12-04 ENCOUNTER — Inpatient Hospital Stay (HOSPITAL_COMMUNITY)
Admission: RE | Admit: 2015-12-04 | Discharge: 2015-12-08 | DRG: 871 | Disposition: A | Discharge status: Home / Self Care | Payer: Medicare Other | Source: Other Acute Inpatient Hospital | Attending: Internal Medicine | Admitting: Internal Medicine

## 2015-12-04 DIAGNOSIS — R008 Other abnormalities of heart beat: Secondary | ICD-10-CM | POA: Diagnosis not present

## 2015-12-04 DIAGNOSIS — H9209 Otalgia, unspecified ear: Secondary | ICD-10-CM | POA: Diagnosis not present

## 2015-12-04 DIAGNOSIS — M1990 Unspecified osteoarthritis, unspecified site: Secondary | ICD-10-CM | POA: Diagnosis present

## 2015-12-04 DIAGNOSIS — D638 Anemia in other chronic diseases classified elsewhere: Secondary | ICD-10-CM | POA: Diagnosis not present

## 2015-12-04 DIAGNOSIS — I248 Other forms of acute ischemic heart disease: Secondary | ICD-10-CM | POA: Diagnosis present

## 2015-12-04 DIAGNOSIS — K810 Acute cholecystitis: Secondary | ICD-10-CM | POA: Diagnosis not present

## 2015-12-04 DIAGNOSIS — R1011 Right upper quadrant pain: Secondary | ICD-10-CM | POA: Diagnosis not present

## 2015-12-04 DIAGNOSIS — Z9889 Other specified postprocedural states: Secondary | ICD-10-CM | POA: Diagnosis not present

## 2015-12-04 DIAGNOSIS — R778 Other specified abnormalities of plasma proteins: Secondary | ICD-10-CM | POA: Diagnosis present

## 2015-12-04 DIAGNOSIS — Z87891 Personal history of nicotine dependence: Secondary | ICD-10-CM

## 2015-12-04 DIAGNOSIS — Z8249 Family history of ischemic heart disease and other diseases of the circulatory system: Secondary | ICD-10-CM

## 2015-12-04 DIAGNOSIS — R945 Abnormal results of liver function studies: Secondary | ICD-10-CM

## 2015-12-04 DIAGNOSIS — A419 Sepsis, unspecified organism: Principal | ICD-10-CM | POA: Diagnosis present

## 2015-12-04 DIAGNOSIS — I252 Old myocardial infarction: Secondary | ICD-10-CM | POA: Diagnosis not present

## 2015-12-04 DIAGNOSIS — I2510 Atherosclerotic heart disease of native coronary artery without angina pectoris: Secondary | ICD-10-CM

## 2015-12-04 DIAGNOSIS — I10 Essential (primary) hypertension: Secondary | ICD-10-CM | POA: Diagnosis not present

## 2015-12-04 DIAGNOSIS — R079 Chest pain, unspecified: Secondary | ICD-10-CM | POA: Diagnosis present

## 2015-12-04 DIAGNOSIS — N184 Chronic kidney disease, stage 4 (severe): Secondary | ICD-10-CM | POA: Diagnosis not present

## 2015-12-04 DIAGNOSIS — R109 Unspecified abdominal pain: Secondary | ICD-10-CM | POA: Diagnosis not present

## 2015-12-04 DIAGNOSIS — E7800 Pure hypercholesterolemia, unspecified: Secondary | ICD-10-CM | POA: Diagnosis not present

## 2015-12-04 DIAGNOSIS — Z79899 Other long term (current) drug therapy: Secondary | ICD-10-CM | POA: Diagnosis not present

## 2015-12-04 DIAGNOSIS — R7989 Other specified abnormal findings of blood chemistry: Secondary | ICD-10-CM | POA: Diagnosis not present

## 2015-12-04 DIAGNOSIS — D72819 Decreased white blood cell count, unspecified: Secondary | ICD-10-CM | POA: Diagnosis present

## 2015-12-04 DIAGNOSIS — R1010 Upper abdominal pain, unspecified: Secondary | ICD-10-CM | POA: Diagnosis not present

## 2015-12-04 DIAGNOSIS — N179 Acute kidney failure, unspecified: Secondary | ICD-10-CM

## 2015-12-04 DIAGNOSIS — N189 Chronic kidney disease, unspecified: Secondary | ICD-10-CM | POA: Diagnosis not present

## 2015-12-04 DIAGNOSIS — Z7982 Long term (current) use of aspirin: Secondary | ICD-10-CM

## 2015-12-04 DIAGNOSIS — R0602 Shortness of breath: Secondary | ICD-10-CM | POA: Diagnosis not present

## 2015-12-04 DIAGNOSIS — I129 Hypertensive chronic kidney disease with stage 1 through stage 4 chronic kidney disease, or unspecified chronic kidney disease: Secondary | ICD-10-CM | POA: Diagnosis present

## 2015-12-04 DIAGNOSIS — Z955 Presence of coronary angioplasty implant and graft: Secondary | ICD-10-CM

## 2015-12-04 DIAGNOSIS — Z743 Need for continuous supervision: Secondary | ICD-10-CM | POA: Diagnosis not present

## 2015-12-04 DIAGNOSIS — J189 Pneumonia, unspecified organism: Secondary | ICD-10-CM | POA: Diagnosis present

## 2015-12-04 DIAGNOSIS — N289 Disorder of kidney and ureter, unspecified: Secondary | ICD-10-CM | POA: Diagnosis not present

## 2015-12-04 DIAGNOSIS — R748 Abnormal levels of other serum enzymes: Secondary | ICD-10-CM | POA: Diagnosis not present

## 2015-12-04 DIAGNOSIS — M79604 Pain in right leg: Secondary | ICD-10-CM | POA: Diagnosis not present

## 2015-12-04 DIAGNOSIS — K8020 Calculus of gallbladder without cholecystitis without obstruction: Secondary | ICD-10-CM | POA: Diagnosis not present

## 2015-12-04 DIAGNOSIS — K8000 Calculus of gallbladder with acute cholecystitis without obstruction: Secondary | ICD-10-CM | POA: Diagnosis not present

## 2015-12-04 DIAGNOSIS — R0789 Other chest pain: Secondary | ICD-10-CM | POA: Diagnosis not present

## 2015-12-04 DIAGNOSIS — K8010 Calculus of gallbladder with chronic cholecystitis without obstruction: Secondary | ICD-10-CM

## 2015-12-04 DIAGNOSIS — Z9861 Coronary angioplasty status: Secondary | ICD-10-CM

## 2015-12-04 DIAGNOSIS — R6520 Severe sepsis without septic shock: Secondary | ICD-10-CM | POA: Diagnosis present

## 2015-12-04 DIAGNOSIS — M1611 Unilateral primary osteoarthritis, right hip: Secondary | ICD-10-CM | POA: Diagnosis not present

## 2015-12-04 DIAGNOSIS — R05 Cough: Secondary | ICD-10-CM | POA: Diagnosis not present

## 2015-12-04 DIAGNOSIS — M542 Cervicalgia: Secondary | ICD-10-CM | POA: Diagnosis not present

## 2015-12-04 DIAGNOSIS — R509 Fever, unspecified: Secondary | ICD-10-CM

## 2015-12-04 DIAGNOSIS — I739 Peripheral vascular disease, unspecified: Secondary | ICD-10-CM | POA: Diagnosis present

## 2015-12-04 DIAGNOSIS — Z820 Family history of epilepsy and other diseases of the nervous system: Secondary | ICD-10-CM | POA: Diagnosis not present

## 2015-12-04 DIAGNOSIS — R071 Chest pain on breathing: Secondary | ICD-10-CM | POA: Diagnosis not present

## 2015-12-04 DIAGNOSIS — R279 Unspecified lack of coordination: Secondary | ICD-10-CM | POA: Diagnosis not present

## 2015-12-04 DIAGNOSIS — R52 Pain, unspecified: Secondary | ICD-10-CM

## 2015-12-04 DIAGNOSIS — I25119 Atherosclerotic heart disease of native coronary artery with unspecified angina pectoris: Secondary | ICD-10-CM | POA: Diagnosis not present

## 2015-12-04 MED ORDER — HYDRALAZINE HCL 50 MG PO TABS
100.0000 mg | ORAL_TABLET | Freq: Three times a day (TID) | ORAL | Status: DC
  Administered 2015-12-04: 100 mg via ORAL
  Filled 2015-12-04: qty 2

## 2015-12-04 MED ORDER — SODIUM BICARBONATE 650 MG PO TABS
650.0000 mg | ORAL_TABLET | Freq: Two times a day (BID) | ORAL | Status: DC
  Administered 2015-12-04 – 2015-12-08 (×8): 650 mg via ORAL
  Filled 2015-12-04 (×8): qty 1

## 2015-12-04 MED ORDER — SENNOSIDES-DOCUSATE SODIUM 8.6-50 MG PO TABS: 1.0000 | ORAL_TABLET | Freq: Every evening | ORAL | Status: DC | PRN

## 2015-12-04 MED ORDER — AZITHROMYCIN 250 MG PO TABS
500.0000 mg | ORAL_TABLET | Freq: Every day | ORAL | Status: DC
  Administered 2015-12-04: 500 mg via ORAL
  Filled 2015-12-04: qty 2

## 2015-12-04 MED ORDER — DEXTROSE 5 % IV SOLN: 500.0000 mg | Freq: Once | INTRAVENOUS | Status: DC

## 2015-12-04 MED ORDER — ONDANSETRON HCL 4 MG/2ML IJ SOLN: 4.0000 mg | Freq: Four times a day (QID) | INTRAMUSCULAR | Status: DC | PRN

## 2015-12-04 MED ORDER — METOPROLOL TARTRATE 12.5 MG HALF TABLET
12.5000 mg | ORAL_TABLET | Freq: Two times a day (BID) | ORAL | Status: DC
  Administered 2015-12-04: 12.5 mg via ORAL
  Filled 2015-12-04: qty 1

## 2015-12-04 MED ORDER — NITROGLYCERIN 0.4 MG SL SUBL: 0.4000 mg | SUBLINGUAL_TABLET | SUBLINGUAL | Status: DC | PRN

## 2015-12-04 MED ORDER — ACETAMINOPHEN 650 MG RE SUPP: 650.0000 mg | Freq: Four times a day (QID) | RECTAL | Status: DC | PRN

## 2015-12-04 MED ORDER — ISOSORBIDE MONONITRATE ER 60 MG PO TB24: 60.0000 mg | ORAL_TABLET | Freq: Every day | ORAL | Status: DC

## 2015-12-04 MED ORDER — DEXTROSE 5 % IV SOLN: 1.0000 g | Freq: Once | INTRAVENOUS | Status: DC

## 2015-12-04 MED ORDER — AMLODIPINE BESYLATE 10 MG PO TABS: 10.0000 mg | ORAL_TABLET | Freq: Every day | ORAL | Status: DC

## 2015-12-04 MED ORDER — ASPIRIN EC 81 MG PO TBEC
81.0000 mg | DELAYED_RELEASE_TABLET | Freq: Every day | ORAL | Status: DC
Start: 2015-12-05 — End: 2015-12-08
  Administered 2015-12-05 – 2015-12-08 (×4): 81 mg via ORAL
  Filled 2015-12-04 (×4): qty 1

## 2015-12-04 MED ORDER — HYDROMORPHONE HCL 1 MG/ML IJ SOLN
0.5000 mg | INTRAMUSCULAR | Status: DC | PRN
  Administered 2015-12-04 – 2015-12-07 (×7): 0.5 mg via INTRAVENOUS
  Filled 2015-12-04 (×8): qty 1

## 2015-12-04 MED ORDER — SODIUM CHLORIDE 0.9 % IV SOLN
INTRAVENOUS | Status: DC
  Administered 2015-12-04 – 2015-12-08 (×2): via INTRAVENOUS

## 2015-12-04 MED ORDER — ATORVASTATIN CALCIUM 80 MG PO TABS
80.0000 mg | ORAL_TABLET | Freq: Every day | ORAL | Status: DC
  Administered 2015-12-04: 80 mg via ORAL
  Filled 2015-12-04: qty 1

## 2015-12-04 MED ORDER — SODIUM CHLORIDE 0.9% FLUSH
3.0000 mL | Freq: Two times a day (BID) | INTRAVENOUS | Status: DC
  Administered 2015-12-04 – 2015-12-08 (×2): 3 mL via INTRAVENOUS

## 2015-12-04 MED ORDER — DEXTROSE 5 % IV SOLN
1.0000 g | Freq: Every day | INTRAVENOUS | Status: DC
  Administered 2015-12-05: 1 g via INTRAVENOUS
  Filled 2015-12-04 (×2): qty 10

## 2015-12-04 MED ORDER — ENOXAPARIN SODIUM 30 MG/0.3ML ~~LOC~~ SOLN
30.0000 mg | SUBCUTANEOUS | Status: DC
  Administered 2015-12-06 – 2015-12-08 (×3): 30 mg via SUBCUTANEOUS
  Filled 2015-12-04 (×4): qty 0.3

## 2015-12-04 MED ORDER — ACETAMINOPHEN 325 MG PO TABS: 650.0000 mg | ORAL_TABLET | Freq: Four times a day (QID) | ORAL | Status: DC | PRN

## 2015-12-04 MED ORDER — POLYSACCHARIDE IRON COMPLEX 150 MG PO CAPS
150.0000 mg | ORAL_CAPSULE | Freq: Every day | ORAL | Status: DC
  Administered 2015-12-05: 150 mg via ORAL
  Filled 2015-12-04: qty 1

## 2015-12-04 MED ORDER — ONDANSETRON HCL 4 MG PO TABS: 4.0000 mg | ORAL_TABLET | Freq: Four times a day (QID) | ORAL | Status: DC | PRN

## 2015-12-04 NOTE — H&P (Signed)
History and Physical  Patient Name: Kevin [name]     BTD:[id number]    DOB: [birth year]    DOA: 12/04/2015 PCP: Glenda Chroman, MD  Nephrology: Dr. Hinda Lenis Cardiology: Dr. Domenic Polite     Patient coming from: Home --[facility]  Chief Complaint: Chest pain  HPI: Kevin [name] is a 82 y.o. male with a past medical history significant for CAD s/p DES to LAD 2014, CKD III-IV, baseline Cr 1.5-1.6, HTN, and anemia of renal disease who presents with chest pressure and fever and LFTs.  The patient was in his normal state of health (except for a mild new cough and some ear discomfort "like an ear ache") until tonight around 4PM, when he was watching television and had sudden onset of central chest discomfort, radiating to the back of his neck, moderate to severe in intensity, with associated SOB and feeling like something "sitting on his chest".  Per EMS, his symptoms improved with O2 and also with nitroglycerin, and he was transported to [facility] ER.    ED course: -Febrile to 101.53F, heart rate 130s, pulse ox and respirations normal, BP 138/73 -Na 140, K 4.0, Cr 1.8 (baseline 1.5-1.6), WBC 2.8K, Hgb 12.5 -AST/ALT 420/216 respectively, total bilirubin normal -Chest and hip radiographs were normal -ECG showed frequent ectopy but no ST changes -The patient was recommended to be admitted to [facility] for serial troponins and RUQ [place] but his family requested transfer to [facility] and so the case was accepted to a stepdown bed     ROS: Review of Systems  Constitutional: Positive for chills and fever (only after arriving to ER).  HENT: Positive for ear pain. Negative for congestion, sinus pain and sore throat.   Respiratory: Positive for cough. Negative for sputum production, shortness of breath and wheezing.   Cardiovascular: Positive for chest pain. Negative for palpitations.  Gastrointestinal: Positive for abdominal pain and nausea. Negative for blood in stool, constipation, diarrhea, melena and  vomiting.  Musculoskeletal: Positive for joint pain (right hip, acute on chronic).  Neurological: Positive for dizziness.  All other systems reviewed and are negative.         Past Medical History:  Diagnosis Date  . Arthritis   . CAD (coronary artery disease)    a. s/p DES to LAD (2014)  b. s/p LHC (09/2014) with patent LAD stent and stable dz from previous cath   . Chronic anemia   . CKD (chronic kidney disease), stage IV ([place])   . Claudication ([place])    R leg with bilateral femoral bruits.  . Dyslipidemia   . Essential hypertension, benign   . Tobacco abuse     Past Surgical History:  Procedure Laterality Date  . Bilateral carpal tunnel release Bilateral   . CARDIAC CATHETERIZATION N/A 10/10/2014   Procedure: Left Heart Cath and Coronary Angiography;  Surgeon: Belva Crome, MD;  Location: [facility];  Service: Cardiovascular;  Laterality: N/A;  . LEFT HEART CATHETERIZATION WITH CORONARY ANGIOGRAM N/A 02/09/2012   Procedure: LEFT HEART CATHETERIZATION WITH CORONARY ANGIOGRAM;  Surgeon: Peter M Martinique, MD; left main 10%, LAD 80%/95%, D1 patent, CFX 70% at OM 2, dCFX 90%, RCA 100% with L-R collaterals    . PERCUTANEOUS CORONARY STENT INTERVENTION (PCI-S) N/A 02/11/2012   Procedure: PERCUTANEOUS CORONARY STENT INTERVENTION (PCI-S);  Surgeon: Burnell Blanks, MD; 3.0 x 28 mm Promus Premier DES to the mid LAD   . REPAIR OF PERFORATED ULCER      Social History: Patient  lives alone.  The patient walks unassisted.  He is independent with all ADLs and IADLs.  He is a former smoker, quit a few years ago.  He is a retired Administrator.    No Known Allergies  Family history: family history includes Alzheimer's disease in his mother; Heart disease in his brother and sister; Hypertension in his mother; Other in his father.  Prior to Admission medications   Medication Sig Start Date End Date Taking? Authorizing Provider  amLODipine (NORVASC) 10 MG tablet Take 10 mg by mouth  daily.    Historical Provider, MD  aspirin EC 81 MG tablet Take 81 mg by mouth daily.    Historical Provider, MD  atorvastatin (LIPITOR) 80 MG tablet TAKE ONE TABLET BY MOUTH AT BEDTIME 11/06/14   Satira Sark, MD  azelastine (ASTELIN) 0.1 % nasal spray USE ONE SPARY IN EACH NOSTRIL TWICE DAILY 05/08/15   Historical Provider, MD  Cholecalciferol (VITAMIN D-3) 1000 UNITS CAPS Take 1,000 Units by mouth daily.     Historical Provider, MD  hydrALAZINE (APRESOLINE) 100 MG tablet TAKE ONE TABLET BY MOUTH THREE TIMES DAILY. 11/23/14   Satira Sark, MD  iron polysaccharides (NIFEREX) 150 MG capsule Take 150 mg by mouth daily.    Historical Provider, MD  isosorbide mononitrate (IMDUR) 60 MG 24 hr tablet TAKE ONE TABLET BY MOUTH DAILY. 02/19/15   Satira Sark, MD  lisinopril (PRINIVIL,ZESTRIL) 5 MG tablet Take 5 mg by mouth daily. 07/04/15   Historical Provider, MD  metoprolol tartrate (LOPRESSOR) 25 MG tablet Take 0.5 tablets (12.5 mg total) by mouth 2 (two) times daily. 01/24/15   Satira Sark, MD  nitroGLYCERIN (NITROSTAT) 0.4 MG SL tablet Place 1 tablet (0.4 mg total) under the tongue every 5 (five) minutes as needed for chest pain (up to 3 doses). 07/30/15   Satira Sark, MD  sodium bicarbonate 650 MG tablet Take 1 tablet (650 mg total) by mouth 2 (two) times daily. 02/12/12   [place], PA-C       Physical Exam: BP (!) 117/48 (BP Location: Left Arm)   Pulse (!) 47   Temp 98.3 F (36.8 C) (Oral)   Resp 20   Ht 5\' 6"  (1.676 m)   Wt 89.7 kg (197 lb 11.2 oz)   SpO2 99%   BMI 31.91 kg/m  General appearance: Well-developed, elerly adult male, alert and in mild distress from malaise, appears tired and weak.   Eyes: Anicteric, conjunctiva pink, lids and lashes normal. PERRL.    ENT: No nasal deformity, discharge, epistaxis.  Hearing slightly diminished. OP moist without lesions.   Neck: No neck masses.  Trachea midline.  No thyromegaly/tenderness. Lymph: No cervical or  supraclavicular lymphadenopathy. Skin: Warm and dry.  No jaundice.  No suspicious rashes or lesions. Cardiac: Tachycardic, nl S1-S2, no murmurs appreciated.  Capillary refill is brisk.  JVP normal.  No LE edema.  Radial and DP pulses 2+ and symmetric. Respiratory: Normal respiratory rate and rhythm.  Snoring rhonchi without rales or wheezes. Abdomen: Abdomen soft.  Mild RUQ TTP without guarding, rebound or rigididty. No ascites, distension, hepatosplenomegaly.   MSK: No deformities or effusions.  No cyanosis or clubbing.  The right hip has good passive ROM with flexion, internal and external rotation without pain. Neuro: Cranial nerves 3-12 intact.  Sensation intact to light touch. Speech is fluent.  Muscle strength normal.    Psych: Sensorium intact and responding to questions, attention normal.  Behavior appropriate.  Affect blunted.  Judgment and insight appear normal.     Labs on Admission:  I have personally reviewed following labs and imaging studies: CBC:  Recent Labs Lab 12/04/15 2323  WBC 10.1  NEUTROABS 9.4*  HGB 10.9*  HCT 33.5*  MCV 94.4  PLT 295*   Basic Metabolic Panel:  Recent Labs Lab 12/04/15 2323  NA 137  K 4.2  CL 110  CO2 20*  GLUCOSE 91  BUN 27*  CREATININE 2.39*  CALCIUM 8.3*   GFR: Estimated Creatinine Clearance: 24.6 mL/min (by C-G formula based on SCr of 2.39 mg/dL (H)).  Liver Function Tests:  Recent Labs Lab 12/04/15 2323  AST 1,432*  ALT 840*  ALKPHOS 120  BILITOT 1.1  PROT 5.5*  ALBUMIN 2.9*   No results for input(s): LIPASE, AMYLASE in the last 168 hours. No results for input(s): AMMONIA in the last 168 hours. Coagulation Profile: No results for input(s): INR, PROTIME in the last 168 hours. Cardiac Enzymes:  Recent Labs Lab 12/04/15 2323  TROPONINI 0.28*   BNP (last 3 results) No results for input(s): PROBNP in the last 8760 hours. HbA1C: No results for input(s): HGBA1C in the last 72 hours. CBG: No results for  input(s): GLUCAP in the last 168 hours. Lipid Profile: No results for input(s): CHOL, HDL, LDLCALC, TRIG, CHOLHDL, LDLDIRECT in the last 72 hours. Thyroid Function Tests: No results for input(s): TSH, T4TOTAL, FREET4, T3FREE, THYROIDAB in the last 72 hours. Anemia Panel: No results for input(s): VITAMINB12, FOLATE, FERRITIN, TIBC, IRON, RETICCTPCT in the last 72 hours. Sepsis Labs: Lactic acid 1.9 Invalid input(s): PROCALCITONIN, LACTICIDVEN Recent Results (from the past 240 hour(s))  Culture, blood (x 2)     Status: None (Preliminary result)   Collection Time: 12/04/15 11:29 PM  Result Value Ref Range Status   Specimen Description BLOOD LEFT HAND  Final   Special Requests BOTTLES DRAWN AEROBIC AND ANAEROBIC 5CC EA  Final   Culture PENDING  Incomplete   Report Status PENDING  Incomplete         Radiological Exams on Admission: Personally reviewed [place] report: US Abdomen Limited  Result Date: 12/05/2015 CLINICAL DATA:  Elevated liver function studies. Right upper quadrant pain for 2 days. History of hypertension and chronic kidney disease. EXAM: US ABDOMEN LIMITED - RIGHT UPPER QUADRANT COMPARISON:  None. FINDINGS: Gallbladder: Cholelithiasis with several stones in the gallbladder and extending to the gallbladder neck. Largest stone measures about 1 cm diameter. Mild gallbladder wall thickening at 3.8 mm maximum. Mild pericholecystic edema. Murphy's sign is negative. Common bile duct: Diameter: 3.5 mm, normal Liver: Mild diffusely increased liver parenchymal echotexture suggesting fatty infiltration. No focal lesions identified. IMPRESSION: Cholelithiasis with mild gallbladder wall thickening and edema. Although Murphy's sign is negative, changes may indicate acute cholecystitis in the appropriate clinical setting. No bile duct dilatation. Electronically Signed   By: Lucienne Capers M.D.   On: 12/05/2015 00:19    EKG: Independently reviewed. Rate 95, bigeminy.    Assessment/Plan  1.  Possible sepsis:  Suspected source unclear: pneumonia (given cough, although negative CXR) vs intra-abdominal. Organism unknown. Patient meets criteria given tachycardia, tachypnea, fever, leukopenia.  Lactate pending.  -Sepsis bundle utilized:  -Draw blood and urine cultures  -Obtain lactic acid   -Start targeted antibiotics with ceftriaxone and azithromycin, based on suspected source of infection (CAP vs intra-abdominal)  -Repeat renal function and complete blood count in AM  -Obtain Resp virus panel  -Check procalcitonin  -IVF overnight  2. Elevated LFTs:  RUQ pain on exam.  No previous gall-bladder disease. -Obtain RUQ [place] -If [place] positive, will consult Surgery -If [place] negative, will obtain non-contrasted CT abdomen/pelvis -Check hepatitis serologies -Trend CMP  3. Chest pain:  Unclear cause.  Aspirin 325 given in ER.  At present, given fever, LFTs, not clear that this is primary process.  Will monitor and trend enzymes. -Trend troponin -Nitro SL PRN for pain  4. HTN and CAD secondary prevention:  -Hold amlodipine, metoprolol, hydralazine until hemodynamics clearer -Continue aspirin, statin -Hold lisinopril for now  5. Anemia:  Stable. -Continue iron  6. CKD III-IV:  Baseline 1.6-1.8.   -Avoid nephrotoxins -Continue oral bicarb          DVT prophylaxis: Lovenox  Code Status: FULL  Family Communication: Daughter and grandson at bedside  Disposition Plan: Anticipate IV fluids and antibiotics and trend troponins and obtain RUQ [place]. Consults called: Cardiology consulted via InBasket Admission status: INPATIENT, stepdown       Medical decision making: Patient seen at 9:45 PM on 12/04/2015.  The patient was discussed with Dr. Olevia Bowens.  What exists of the patient's chart was reviewed in depth and outside records were reviewed from transferring hospital and summarized above.  Clinical condition: guarded.        [facility] Pager [phone]      At the time of admission, it appears that the appropriate admission status for this patient is INPATIENT. This is judged to be reasonable and necessary in order to provide the required intensity of service to ensure the patient's safety given the presenting symptoms, physical exam findings, and initial radiographic and laboratory data in the context of their chronic comorbidities.  Together, these circumstances are felt to place her/him at high risk for further clinical deterioration threatening life, limb, or organ. The following factors support the admission status of inpatient:   A. The patient's presenting symptoms include chest pain, malaise, fever B. The worrisome physical exam findings include fever, tachycardia C. The initial radiographic and laboratory data are worrisome because of elevated creatinine, elevated AST/ALT, leukopenia D. The chronic co-morbidities include CAD, CKD, HTN, anemia E. Patient requires inpatient status due to high intensity of service, high risk for further deterioration and high frequency of surveillance required because of this acute illness that poses a threat to life or bodily function. F. I certify that at the point of admission it is my clinical judgment that the patient will require inpatient hospital care spanning beyond 2 midnights from the point of admission and that early discharge would result in unnecessary risk of decompensation and readmission or threat to life, limb or bodily function.

## 2015-12-04 NOTE — Progress Notes (Signed)
Patient arrived from [facility].  Patient complaining of 2/10 center chest tightness at this time.  Per EMS received 1 tablet sublingual nitroglycerin en route for 6/10 chest tightness.  [facility] pager text paged with this information via [place].

## 2015-12-05 ENCOUNTER — Inpatient Hospital Stay (HOSPITAL_COMMUNITY): Payer: Medicare Other

## 2015-12-05 DIAGNOSIS — K810 Acute cholecystitis: Secondary | ICD-10-CM | POA: Diagnosis present

## 2015-12-05 DIAGNOSIS — R748 Abnormal levels of other serum enzymes: Secondary | ICD-10-CM

## 2015-12-05 DIAGNOSIS — R778 Other specified abnormalities of plasma proteins: Secondary | ICD-10-CM | POA: Diagnosis present

## 2015-12-05 DIAGNOSIS — R7989 Other specified abnormal findings of blood chemistry: Secondary | ICD-10-CM

## 2015-12-05 DIAGNOSIS — A419 Sepsis, unspecified organism: Secondary | ICD-10-CM | POA: Diagnosis present

## 2015-12-05 DIAGNOSIS — N289 Disorder of kidney and ureter, unspecified: Secondary | ICD-10-CM

## 2015-12-05 DIAGNOSIS — N189 Chronic kidney disease, unspecified: Secondary | ICD-10-CM

## 2015-12-05 LAB — RESPIRATORY PANEL BY PCR
ADENOVIRUS-RVPPCR: NOT DETECTED
Bordetella pertussis: NOT DETECTED
CORONAVIRUS 229E-RVPPCR: NOT DETECTED
CORONAVIRUS NL63-RVPPCR: NOT DETECTED
CORONAVIRUS OC43-RVPPCR: NOT DETECTED
Chlamydophila pneumoniae: NOT DETECTED
Coronavirus HKU1: NOT DETECTED
INFLUENZA B-RVPPCR: NOT DETECTED
Influenza A: NOT DETECTED
MYCOPLASMA PNEUMONIAE-RVPPCR: NOT DETECTED
Metapneumovirus: NOT DETECTED
PARAINFLUENZA VIRUS 1-RVPPCR: NOT DETECTED
Parainfluenza Virus 2: NOT DETECTED
Parainfluenza Virus 3: NOT DETECTED
Parainfluenza Virus 4: NOT DETECTED
Respiratory Syncytial Virus: NOT DETECTED
Rhinovirus / Enterovirus: NOT DETECTED

## 2015-12-05 LAB — COMPREHENSIVE METABOLIC PANEL
ALT: 840 U/L — AB (ref 17–63)
ALT: 751 U/L — AB (ref 17–63)
ANION GAP: 9 (ref 5–15)
AST: 1432 U/L — AB (ref 15–41)
AST: 873 U/L — ABNORMAL HIGH (ref 15–41)
Albumin: 2.9 g/dL — ABNORMAL LOW (ref 3.5–5.0)
Albumin: 2.9 g/dL — ABNORMAL LOW (ref 3.5–5.0)
Alkaline Phosphatase: 120 U/L (ref 38–126)
Alkaline Phosphatase: 109 U/L (ref 38–126)
Anion gap: 7 (ref 5–15)
BUN: 27 mg/dL — ABNORMAL HIGH (ref 6–20)
BUN: 28 mg/dL — ABNORMAL HIGH (ref 6–20)
CALCIUM: 8.3 mg/dL — AB (ref 8.9–10.3)
CHLORIDE: 110 mmol/L (ref 101–111)
CHLORIDE: 112 mmol/L — AB (ref 101–111)
CO2: 20 mmol/L — AB (ref 22–32)
CO2: 18 mmol/L — ABNORMAL LOW (ref 22–32)
CREATININE: 2.37 mg/dL — AB (ref 0.61–1.24)
Calcium: 7.7 mg/dL — ABNORMAL LOW (ref 8.9–10.3)
Creatinine, Ser: 2.39 mg/dL — ABNORMAL HIGH (ref 0.61–1.24)
GFR calc Af Amer: 27 mL/min — ABNORMAL LOW (ref >60)
GFR, EST AFRICAN AMERICAN: 28 mL/min — AB (ref >60)
GFR, EST NON AFRICAN AMERICAN: 24 mL/min — AB (ref >60)
GFR, EST NON AFRICAN AMERICAN: 24 mL/min — AB (ref >60)
Glucose, Bld: 91 mg/dL (ref 65–99)
Glucose, Bld: 81 mg/dL (ref 65–99)
Potassium: 4.2 mmol/L (ref 3.5–5.1)
Potassium: 4.5 mmol/L (ref 3.5–5.1)
Sodium: 137 mmol/L (ref 135–145)
Sodium: 139 mmol/L (ref 135–145)
TOTAL PROTEIN: 5.5 g/dL — AB (ref 6.5–8.1)
Total Bilirubin: 1.1 mg/dL (ref 0.3–1.2)
Total Bilirubin: 1.2 mg/dL (ref 0.3–1.2)
Total Protein: 5.6 g/dL — ABNORMAL LOW (ref 6.5–8.1)

## 2015-12-05 LAB — CBC
HCT: 32.6 % — ABNORMAL LOW (ref 39.0–52.0)
Hemoglobin: 10.5 g/dL — ABNORMAL LOW (ref 13.0–17.0)
MCH: 30.5 pg (ref 26.0–34.0)
MCHC: 32.2 g/dL (ref 30.0–36.0)
MCV: 94.8 fL (ref 78.0–100.0)
PLATELETS: 120 10*3/uL — AB (ref 150–400)
RBC: 3.44 MIL/uL — ABNORMAL LOW (ref 4.22–5.81)
RDW: 14.3 % (ref 11.5–15.5)
WBC: 19 10*3/uL — ABNORMAL HIGH (ref 4.0–10.5)

## 2015-12-05 LAB — URINALYSIS, ROUTINE W REFLEX MICROSCOPIC
GLUCOSE, UA: NEGATIVE mg/dL
Hgb urine dipstick: NEGATIVE
Ketones, ur: NEGATIVE mg/dL
Nitrite: NEGATIVE
PH: 5 (ref 5.0–8.0)
PROTEIN: 30 mg/dL — AB
Specific Gravity, Urine: 1.021 (ref 1.005–1.030)

## 2015-12-05 LAB — URINE MICROSCOPIC-ADD ON: RBC / HPF: NONE SEEN RBC/hpf (ref 0–5)

## 2015-12-05 LAB — PROCALCITONIN

## 2015-12-05 LAB — CBC WITH DIFFERENTIAL/PLATELET
BASOS ABS: 0 10*3/uL (ref 0.0–0.1)
Basophils Relative: 0 %
EOS ABS: 0 10*3/uL (ref 0.0–0.7)
Eosinophils Relative: 0 %
HCT: 33.5 % — ABNORMAL LOW (ref 39.0–52.0)
HEMOGLOBIN: 10.9 g/dL — AB (ref 13.0–17.0)
LYMPHS PCT: 4 %
Lymphs Abs: 0.4 10*3/uL — ABNORMAL LOW (ref 0.7–4.0)
MCH: 30.7 pg (ref 26.0–34.0)
MCHC: 32.5 g/dL (ref 30.0–36.0)
MCV: 94.4 fL (ref 78.0–100.0)
MONOS PCT: 3 %
Monocytes Absolute: 0.3 10*3/uL (ref 0.1–1.0)
NEUTROS PCT: 93 %
Neutro Abs: 9.4 10*3/uL — ABNORMAL HIGH (ref 1.7–7.7)
PLATELETS: 124 10*3/uL — AB (ref 150–400)
RBC: 3.55 MIL/uL — ABNORMAL LOW (ref 4.22–5.81)
RDW: 14.1 % (ref 11.5–15.5)
WBC: 10.1 10*3/uL (ref 4.0–10.5)

## 2015-12-05 LAB — LACTIC ACID, PLASMA
LACTIC ACID, VENOUS: 1.9 mmol/L (ref 0.5–1.9)
LACTIC ACID, VENOUS: 1.7 mmol/L (ref 0.5–1.9)
Lactic Acid, Venous: 3 mmol/L (ref 0.5–1.9)
Lactic Acid, Venous: 1.5 mmol/L (ref 0.5–1.9)

## 2015-12-05 LAB — TROPONIN I
TROPONIN I: 0.32 ng/mL — AB (ref <0.03)
TROPONIN I: 0.34 ng/mL — AB (ref <0.03)
Troponin I: 0.28 ng/mL (ref <0.03)

## 2015-12-05 MED ORDER — PIPERACILLIN-TAZOBACTAM 3.375 G IVPB 30 MIN
3.3750 g | Freq: Once | INTRAVENOUS | Status: AC
  Administered 2015-12-05: 3.375 g via INTRAVENOUS
  Filled 2015-12-05: qty 50

## 2015-12-05 MED ORDER — HYDRALAZINE HCL 20 MG/ML IJ SOLN: 5.0000 mg | Freq: Four times a day (QID) | INTRAMUSCULAR | Status: DC | PRN

## 2015-12-05 MED ORDER — PIPERACILLIN-TAZOBACTAM 3.375 G IVPB
3.3750 g | Freq: Three times a day (TID) | INTRAVENOUS | Status: DC
  Administered 2015-12-05 – 2015-12-06 (×3): 3.375 g via INTRAVENOUS
  Filled 2015-12-05 (×5): qty 50

## 2015-12-05 MED ORDER — PIPERACILLIN-TAZOBACTAM 3.375 G IVPB
3.3750 g | Freq: Every day | INTRAVENOUS | Status: DC
  Filled 2015-12-05: qty 50

## 2015-12-05 MED ORDER — SODIUM CHLORIDE 0.9 % IV BOLUS (SEPSIS)
1000.0000 mL | Freq: Once | INTRAVENOUS | Status: AC
  Administered 2015-12-05: 1000 mL via INTRAVENOUS

## 2015-12-05 MED ORDER — NITROGLYCERIN 2 % TD OINT
0.5000 [in_us] | TOPICAL_OINTMENT | Freq: Three times a day (TID) | TRANSDERMAL | Status: DC
Start: 2015-12-05 — End: 2015-12-06
  Filled 2015-12-05: qty 30

## 2015-12-05 MED ORDER — METOPROLOL TARTRATE 5 MG/5ML IV SOLN
2.5000 mg | Freq: Three times a day (TID) | INTRAVENOUS | Status: DC
  Administered 2015-12-05 – 2015-12-06 (×2): 2.5 mg via INTRAVENOUS
  Filled 2015-12-05 (×2): qty 5

## 2015-12-05 MED ORDER — TECHNETIUM TC 99M MEBROFENIN IV KIT
5.0000 | PACK | Freq: Once | INTRAVENOUS | Status: AC | PRN
  Administered 2015-12-05: 5 via INTRAVENOUS

## 2015-12-05 NOTE — Consult Note (Signed)
Reason for Consult: acute cholecystitis Referring Physician: Dr. Marrion Coy [name] is an 82 y.o. male.  HPI: 4-year-old male transferred from [facility] with chest pain. He has a history of coronary disease with DES to the LAD 2014. Kidney disease baseline reported at 1.5-1.6. Patient reported onset of pain yesterday around 4 PM watching TV. He describes central chest discomfort radiating to the back of his neck to severe in intensity with shortness of breath. He reported it felt like someone was sitting on his chest. He was treated with oxygen and nitroglycerin by EMS. Workup in the ED there showed he was febrile with a temperature of 101.2, heart rate in the 130s BP 138/73. Creatinine was 1.8 AST and ALT were elevated at 420/216 respectively. EKG showed frequent ectopy but no ST changes. At this point he was transferred to [facility] for further treatment.  Evaluation here at [facility] shows he is afebrile and his heart rate is in the 70-80 range currently BP is stable at 100/60 range. Creatinine is elevated this a.m. to 2.37 AST on admission was 1432 this a.m. it is 873. ALT yesterday was 840 this a.m. and is 751 bilirubin remains normal. Troponins are 0.28, 0.32 and 0.34 respectively, lactate was as high as 3.0. Currently is down to 1.5. WBC on admission last evening was 10.1. This morning it is 19,000. Abdominal ultrasound on admission shows cholelithiasis with several stones in the gallbladder and extending to the gallbladder neck the largest stone is 1 cm in diameter. Mild gallbladder wall thickening at 3.8 mm maximum mild pericholecystic edema. Common bile duct is 3.5 mm and normal. Treatment so far is included azithromycin 500 mg by mouth, and Rocephin 1 g IV. Dr. Eliseo Squires has discontinued this and started him on Zosyn. We are asked to see. His daughter is in the room with him and keeping notes for the family.    Past Medical History:  Diagnosis Date  . Arthritis    . CAD (coronary artery disease)    a. s/p DES to LAD (2014)  b. s/p LHC (09/2014) with patent LAD stent and stable dz from previous cath   . Chronic anemia   . CKD (chronic kidney disease), stage IV ([place])   . Claudication ([place])    R leg with bilateral femoral bruits.  . Dyslipidemia   . Essential hypertension, benign   . Tobacco abuse     Past Surgical History:  Procedure Laterality Date  . Bilateral carpal tunnel release Bilateral   . CARDIAC CATHETERIZATION N/A 10/10/2014   Procedure: Left Heart Cath and Coronary Angiography;  Surgeon: Belva Crome, MD;  Location: [place] [facility];  Service: Cardiovascular;  Laterality: N/A;  . LEFT HEART CATHETERIZATION WITH CORONARY ANGIOGRAM N/A 02/09/2012   Procedure: LEFT HEART CATHETERIZATION WITH CORONARY ANGIOGRAM;  Surgeon: Peter M Martinique, MD; left main 10%, LAD 80%/95%, D1 patent, CFX 70% at OM 2, dCFX 90%, RCA 100% with L-R collaterals    . PERCUTANEOUS CORONARY STENT INTERVENTION (PCI-S) N/A 02/11/2012   Procedure: PERCUTANEOUS CORONARY STENT INTERVENTION (PCI-S);  Surgeon: Burnell Blanks, MD; 3.0 x 28 mm Promus Premier DES to the mid LAD   . REPAIR OF PERFORATED ULCER      Family History  Problem Relation Age of Onset  . Hypertension Mother   . Alzheimer's disease Mother   . Other Father     brain tumor  . Heart disease Brother   . Heart disease Sister  Social History:  reports that he quit smoking about 3 years ago. His smoking use included Cigarettes. He started smoking about 47 years ago. He has a 22.50 pack-year smoking history. He has never used smokeless tobacco. He reports that he does not drink alcohol or use drugs.  Allergies: No Known Allergies  Medications:  Prior to Admission:  Prescriptions Prior to Admission  Medication Sig Dispense Refill Last Dose  . amLODipine (NORVASC) 10 MG tablet Take 10 mg by mouth daily.   Taking  . aspirin EC 81 MG tablet Take 81 mg by mouth daily.   Taking  . atorvastatin  (LIPITOR) 80 MG tablet TAKE ONE TABLET BY MOUTH AT BEDTIME 30 tablet 3 Taking  . azelastine (ASTELIN) 0.1 % nasal spray USE ONE SPARY IN EACH NOSTRIL TWICE DAILY  3 Taking  . Cholecalciferol (VITAMIN D-3) 1000 UNITS CAPS Take 1,000 Units by mouth daily.    Taking  . hydrALAZINE (APRESOLINE) 100 MG tablet TAKE ONE TABLET BY MOUTH THREE TIMES DAILY. 90 tablet 5 Taking  . iron polysaccharides (NIFEREX) 150 MG capsule Take 150 mg by mouth daily.   Taking  . isosorbide mononitrate (IMDUR) 60 MG 24 hr tablet TAKE ONE TABLET BY MOUTH DAILY. 30 tablet 6 Taking  . lisinopril (PRINIVIL,ZESTRIL) 5 MG tablet Take 5 mg by mouth daily.  99 Taking  . metoprolol tartrate (LOPRESSOR) 25 MG tablet Take 0.5 tablets (12.5 mg total) by mouth 2 (two) times daily. 90 tablet 3 Taking  . nitroGLYCERIN (NITROSTAT) 0.4 MG SL tablet Place 1 tablet (0.4 mg total) under the tongue every 5 (five) minutes as needed for chest pain (up to 3 doses). 25 tablet 4   . sodium bicarbonate 650 MG tablet Take 1 tablet (650 mg total) by mouth 2 (two) times daily. 60 tablet 0 Taking   Scheduled: . aspirin EC  81 mg Oral Daily  . atorvastatin  80 mg Oral QHS  . enoxaparin (LOVENOX) injection  30 mg Subcutaneous Q24H  . iron polysaccharides  150 mg Oral Daily  . piperacillin-tazobactam  3.375 g Intravenous Once  . piperacillin-tazobactam (ZOSYN)  IV  3.375 g Intravenous Q1400  . sodium bicarbonate  650 mg Oral BID  . sodium chloride flush  3 mL Intravenous Q12H   Continuous: . sodium chloride 125 mL/hr at 12/05/15 0510   VQQ:[id number] **OR** acetaminophen, HYDROmorphone (DILAUDID) injection, nitroGLYCERIN, ondansetron **OR** ondansetron (ZOFRAN) IV, senna-docusate Anti-infectives    Start     Dose/Rate Route Frequency Ordered Stop   12/05/15 1400  piperacillin-tazobactam (ZOSYN) IVPB 3.375 g     3.375 g 12.5 mL/hr over 240 Minutes Intravenous Daily 12/05/15 0741     12/05/15 0745  piperacillin-tazobactam (ZOSYN) IVPB 3.375 g       3.375 g 100 mL/hr over 30 Minutes Intravenous  Once 12/05/15 0741     12/04/15 2245  azithromycin (ZITHROMAX) tablet 500 mg  Status:  Discontinued     500 mg Oral Daily 12/04/15 2240 12/05/15 0737   12/04/15 2245  cefTRIAXone (ROCEPHIN) 1 g in dextrose 5 % 50 mL IVPB  Status:  Discontinued     1 g 100 mL/hr over 30 Minutes Intravenous Daily 12/04/15 2240 12/05/15 0737   12/04/15 2215  cefTRIAXone (ROCEPHIN) 1 g in dextrose 5 % 50 mL IVPB  Status:  Discontinued     1 g 100 mL/hr over 30 Minutes Intravenous  Once 12/04/15 2211 12/04/15 2217   12/04/15 2215  azithromycin (ZITHROMAX) 500 mg in dextrose 5 % 250  mL IVPB  Status:  Discontinued     500 mg 250 mL/hr over 60 Minutes Intravenous  Once 12/04/15 2211 12/04/15 2217      Results for orders placed or performed during the hospital encounter of 12/04/15 (from the past 48 hour(s))  Procalcitonin - Baseline     Status: None   Collection Time: 12/04/15 11:23 PM  Result Value Ref Range   Procalcitonin >200.00 ng/mL    Comment:        Interpretation: PCT >= 10 ng/mL: Important systemic inflammatory response, almost exclusively due to severe bacterial sepsis or septic shock. (NOTE)         ICU PCT Algorithm               Non ICU PCT Algorithm    ----------------------------     ------------------------------         PCT < 0.25 ng/mL                 PCT < 0.1 ng/mL     Stopping of antibiotics            Stopping of antibiotics       strongly encouraged.               strongly encouraged.    ----------------------------     ------------------------------       PCT level decrease by               PCT < 0.25 ng/mL       >= 80% from peak PCT       OR PCT 0.25 - 0.5 ng/mL          Stopping of antibiotics                                             encouraged.     Stopping of antibiotics           encouraged.    ----------------------------     ------------------------------       PCT level decrease by              PCT >= 0.25 ng/mL        < 80% from peak PCT        AND PCT >= 0.5 ng/mL             Continuing antibiotics                                              encouraged.       Continuing antibiotics            encouraged.    ----------------------------     ------------------------------     PCT level increase compared          PCT > 0.5 ng/mL         with peak PCT AND          PCT >= 0.5 ng/mL             Escalation of antibiotics  strongly encouraged.      Escalation of antibiotics        strongly encouraged.   Lactic acid, plasma     Status: None   Collection Time: 12/04/15 11:23 PM  Result Value Ref Range   Lactic Acid, Venous 1.9 0.5 - 1.9 mmol/L  Comprehensive metabolic panel     Status: Abnormal   Collection Time: 12/04/15 11:23 PM  Result Value Ref Range   Sodium 137 135 - 145 mmol/L   Potassium 4.2 3.5 - 5.1 mmol/L   Chloride 110 101 - 111 mmol/L   CO2 20 (L) 22 - 32 mmol/L   Glucose, Bld 91 65 - 99 mg/dL   BUN 27 (H) 6 - 20 mg/dL   Creatinine, Ser 2.39 (H) 0.61 - 1.24 mg/dL   Calcium 8.3 (L) 8.9 - 10.3 mg/dL   Total Protein 5.5 (L) 6.5 - 8.1 g/dL   Albumin 2.9 (L) 3.5 - 5.0 g/dL   AST 1,432 (H) 15 - 41 U/L   ALT 840 (H) 17 - 63 U/L   Alkaline Phosphatase 120 38 - 126 U/L   Total Bilirubin 1.1 0.3 - 1.2 mg/dL   GFR calc non Af Amer 24 (L) >60 mL/min   GFR calc Af Amer 27 (L) >60 mL/min    Comment: (NOTE) The eGFR has been calculated using the CKD EPI equation. This calculation has not been validated in all clinical situations. eGFR's persistently <60 mL/min signify possible Chronic Kidney Disease.    Anion gap 7 5 - 15  CBC with Differential     Status: Abnormal   Collection Time: 12/04/15 11:23 PM  Result Value Ref Range   WBC 10.1 4.0 - 10.5 K/uL   RBC 3.55 (L) 4.22 - 5.81 MIL/uL   Hemoglobin 10.9 (L) 13.0 - 17.0 g/dL   HCT 33.5 (L) 39.0 - 52.0 %   MCV 94.4 78.0 - 100.0 fL   MCH 30.7 26.0 - 34.0 pg   MCHC 32.5 30.0 - 36.0 g/dL   RDW 14.1  11.5 - 15.5 %   Platelets 124 (L) 150 - 400 K/uL   Neutrophils Relative % 93 %   Lymphocytes Relative 4 %   Monocytes Relative 3 %   Eosinophils Relative 0 %   Basophils Relative 0 %   Neutro Abs 9.4 (H) 1.7 - 7.7 K/uL   Lymphs Abs 0.4 (L) 0.7 - 4.0 K/uL   Monocytes Absolute 0.3 0.1 - 1.0 K/uL   Eosinophils Absolute 0.0 0.0 - 0.7 K/uL   Basophils Absolute 0.0 0.0 - 0.1 K/uL   RBC Morphology POLYCHROMASIA PRESENT    WBC Morphology DOHLE BODIES     Comment: VACUOLATED NEUTROPHILS  Troponin I (q 6hr x 3)     Status: Abnormal   Collection Time: 12/04/15 11:23 PM  Result Value Ref Range   Troponin I 0.28 (HH) <0.03 ng/mL    Comment: CRITICAL RESULT CALLED TO, READ BACK BY AND VERIFIED WITH: BUCKNER,B RN 12/05/2015 0033 JORDANS   Culture, blood (x 2)     Status: None (Preliminary result)   Collection Time: 12/04/15 11:29 PM  Result Value Ref Range   Specimen Description BLOOD LEFT HAND    Special Requests BOTTLES DRAWN AEROBIC AND ANAEROBIC 5CC EA    Culture PENDING    Report Status PENDING   Lactic acid, plasma     Status: Abnormal   Collection Time: 12/05/15  2:39 AM  Result Value Ref Range   Lactic Acid, Venous 3.0 (HH) 0.5 -  1.9 mmol/L    Comment: CRITICAL RESULT CALLED TO, READ BACK BY AND VERIFIED WITH: BUCKNER,B RN 12/05/2015 0341 JORDANS   Troponin I     Status: Abnormal   Collection Time: 12/05/15  2:39 AM  Result Value Ref Range   Troponin I 0.32 (HH) <0.03 ng/mL    Comment: CRITICAL VALUE NOTED.  VALUE IS CONSISTENT WITH PREVIOUSLY REPORTED AND CALLED VALUE.  CBC     Status: Abnormal   Collection Time: 12/05/15  5:30 AM  Result Value Ref Range   WBC 19.0 (H) 4.0 - 10.5 K/uL   RBC 3.44 (L) 4.22 - 5.81 MIL/uL   Hemoglobin 10.5 (L) 13.0 - 17.0 g/dL   HCT 32.6 (L) 39.0 - 52.0 %   MCV 94.8 78.0 - 100.0 fL   MCH 30.5 26.0 - 34.0 pg   MCHC 32.2 30.0 - 36.0 g/dL   RDW 14.3 11.5 - 15.5 %   Platelets 120 (L) 150 - 400 K/uL  Comprehensive metabolic panel     Status:  Abnormal   Collection Time: 12/05/15  5:30 AM  Result Value Ref Range   Sodium 139 135 - 145 mmol/L   Potassium 4.5 3.5 - 5.1 mmol/L   Chloride 112 (H) 101 - 111 mmol/L   CO2 18 (L) 22 - 32 mmol/L   Glucose, Bld 81 65 - 99 mg/dL   BUN 28 (H) 6 - 20 mg/dL   Creatinine, Ser 2.37 (H) 0.61 - 1.24 mg/dL   Calcium 7.7 (L) 8.9 - 10.3 mg/dL   Total Protein 5.6 (L) 6.5 - 8.1 g/dL   Albumin 2.9 (L) 3.5 - 5.0 g/dL   AST 873 (H) 15 - 41 U/L   ALT 751 (H) 17 - 63 U/L   Alkaline Phosphatase 109 38 - 126 U/L   Total Bilirubin 1.2 0.3 - 1.2 mg/dL   GFR calc non Af Amer 24 (L) >60 mL/min   GFR calc Af Amer 28 (L) >60 mL/min    Comment: (NOTE) The eGFR has been calculated using the CKD EPI equation. This calculation has not been validated in all clinical situations. eGFR's persistently <60 mL/min signify possible Chronic Kidney Disease.    Anion gap 9 5 - 15  Troponin I     Status: Abnormal   Collection Time: 12/05/15  5:30 AM  Result Value Ref Range   Troponin I 0.34 (HH) <0.03 ng/mL    Comment: CRITICAL VALUE NOTED.  VALUE IS CONSISTENT WITH PREVIOUSLY REPORTED AND CALLED VALUE.  Lactic acid, plasma     Status: None   Collection Time: 12/05/15  5:30 AM  Result Value Ref Range   Lactic Acid, Venous 1.5 0.5 - 1.9 mmol/L    US Abdomen Limited  Result Date: 12/05/2015 CLINICAL DATA:  Elevated liver function studies. Right upper quadrant pain for 2 days. History of hypertension and chronic kidney disease. EXAM: US ABDOMEN LIMITED - RIGHT UPPER QUADRANT COMPARISON:  None. FINDINGS: Gallbladder: Cholelithiasis with several stones in the gallbladder and extending to the gallbladder neck. Largest stone measures about 1 cm diameter. Mild gallbladder wall thickening at 3.8 mm maximum. Mild pericholecystic edema. Murphy's sign is negative. Common bile duct: Diameter: 3.5 mm, normal Liver: Mild diffusely increased liver parenchymal echotexture suggesting fatty infiltration. No focal lesions identified.  IMPRESSION: Cholelithiasis with mild gallbladder wall thickening and edema. Although Murphy's sign is negative, changes may indicate acute cholecystitis in the appropriate clinical setting. No bile duct dilatation. Electronically Signed   By: Oren Beckmann.D.  On: 12/05/2015 00:19    Review of Systems  Constitutional: Positive for chills and fever. Negative for diaphoresis, malaise/fatigue and weight loss.  HENT: Negative.   Eyes: Negative.   Respiratory: Positive for cough (dry) and shortness of breath (with pain). Negative for hemoptysis, sputum production and wheezing.   Cardiovascular: Positive for chest pain and leg swelling. Negative for palpitations, orthopnea, claudication and PND.  Gastrointestinal: Positive for abdominal pain, heartburn (some with pain) and nausea. Negative for blood in stool, constipation, diarrhea, melena and vomiting.  Genitourinary:       Low urine output since he got sick, has not voided much since last PM  Musculoskeletal:       Complaining of pain right hip  Skin: Negative.   Neurological: Negative.  Negative for weakness.  Endo/Heme/Allergies: Negative.   Psychiatric/Behavioral: Negative.    Blood pressure (!) 105/57, pulse 77, temperature 97.8 F (36.6 C), temperature source Oral, resp. rate 18, height 5' 6" (1.676 m), weight 91.8 kg (202 lb 6.4 oz), SpO2 97 %. Physical Exam  Constitutional: He is oriented to person, place, and time. He appears well-developed and well-nourished. No distress.  Daughter is in the room with him and he is in no distress, pain RUQ with palpation.  HENT:  Head: Normocephalic and atraumatic.  Mouth/Throat: No oropharyngeal exudate.  Eyes: Right eye exhibits no discharge. Left eye exhibits no discharge. No scleral icterus.  Neck: Normal range of motion. Neck supple. No JVD present. No tracheal deviation present. No thyromegaly present.  Cardiovascular: Normal rate, regular rhythm, normal heart sounds and intact distal  pulses.   No murmur heard. Respiratory: Effort normal and breath sounds normal. No respiratory distress. He has no wheezes. He has no rales. He exhibits no tenderness.  GI: He exhibits distension. He exhibits no mass. There is tenderness (RUQ). There is no rebound and no guarding.    Musculoskeletal: He exhibits edema (trace edema both lower legs). He exhibits no tenderness.  Lymphadenopathy:    He has no cervical adenopathy.  Neurological: He is alert and oriented to person, place, and time. No cranial nerve deficit.  Skin: Skin is warm and dry. No rash noted. He is not diaphoretic. No erythema. No pallor.  Psychiatric: He has a normal mood and affect. His behavior is normal. Judgment and thought content normal.    Assessment/Plan: Acute cholecystitis/cholelithiasis Chest pain with hx of DES steny 2014/mild troponin elevation/Off Plavix 1 year Chronic on acute kidney disease Hypertension Hx of claudication Tobacco use 63 years, quit 2 years ago Hx of anemia Hx of dyslipidemia  Plan:  Discussed with the Dr. Brantley Stage, patient and daughter. We have recommended IR drain placement with ongoing antibiotic treatment at this point and will discuss surgery after drain removal.  Usually about 6 weeks.  Cardiology consult is also pending.  Kevin [name] 12/05/2015, 7:49 AM

## 2015-12-05 NOTE — Progress Notes (Signed)
Aware of request for perc chole drain.  The patient's imaging has been reviewed by Dr. [name].  There is no clear cut evidence of cholecystitis on his [place] and his LFTs show elevation of his AST/ALT and none of the TB/ALKPH, which could indicate hepatitis.  Prior to proceeding with a perc chole drain placement, he would like a HIDA to confirm ductal obstruction and cholecystitis.  I have discussed this with Will Creig Hines, general surgery PA.  We will order this scan and follow the results.  Brent Taillon E 10:08 AM 12/05/2015

## 2015-12-05 NOTE — Progress Notes (Signed)
PROGRESS NOTE    Kevin [name]  AQT:[id number] DOB: 06-14-1930 DOA: 12/04/2015 PCP: Glenda Chroman, MD   Outpatient Specialists:     Brief Narrative:  Kevin [name] is a 82 y.o. male with a past medical history significant for CAD s/p DES to LAD 2014, CKD III-IV, baseline Cr 1.5-1.6, HTN, and anemia of renal disease who presents with chest pressure and fever and LFTs.  The patient was in his normal state of health (except for a mild new cough and some ear discomfort "like an ear ache") until tonight around 4PM, when he was watching television and had sudden onset of central chest discomfort, radiating to the back of his neck, moderate to severe in intensity, with associated SOB and feeling like something "sitting on his chest".  Per EMS, his symptoms improved with O2 and also with nitroglycerin, and he was transported to [facility] ER.      Assessment & Plan:   Principal Problem:   Chest pain Active Problems:   CKD (chronic kidney disease) stage 4, GFR 15-29 ml/min (HCC)   Essential hypertension, benign   Peripheral arterial disease (HCC)   Anemia of chronic disease   CAD- S/P PCI 2014   Sepsis, unspecified organism ([place])   Elevated LFTs   Sepsis ([place])   Acute cholecystitis   Troponin level elevated   Sepsis due to cholecystitis? Suspected source unclear             -Draw blood and urine cultures             -lactic acid elevated             -zosyn             -Obtain Resp virus panel             - procalcitonin elevated             -IVF overnight  Elevated LFTs:  RUQ pain on exam.  No previous gall-bladder disease. - RUQ [place] -surgery consult appreciated-- HIDA scan pending as well as possible IR consult for perc drain -Check hepatitis serologies -Trend CMP -- IV zosyn  Chest pain:  Unclear cause.  Aspirin 325 given in ER.  At present, given fever, LFTs, not clear that this is primary process.   -Trend troponin -echo pending -cardiology consult   HTN and CAD  secondary prevention:  -Hold amlodipine, metoprolol, hydralazine until hemodynamics clearer -hold statin for elevated LFTs -Hold lisinopril for low BP and AKI  Anemia:  monitor  AKI on CKD III-IV:  Baseline 1.6-1.8.   -Avoid nephrotoxins -Continue oral bicarb   DVT prophylaxis:  Lovenox   Code Status: Full Code   Family Communication: At bedside  Disposition Plan:     Consultants:   Cards  Surgery  IR     Subjective: Right quadrant pain with palpation  Objective: Vitals:   12/05/15 0416 12/05/15 0513 12/05/15 0515 12/05/15 0613  BP: 100/64  (!) 105/57   Pulse: 82  77   Resp: 20  18   Temp:  97.8 F (36.6 C)    TempSrc:  Oral    SpO2: 98%  97%   Weight:    91.8 kg (202 lb 6.4 oz)  Height:        Intake/Output Summary (Last 24 hours) at 12/05/15 1033 Last data filed at 12/05/15 0944  Gross per 24 hour  Intake          2693.55 ml  Output  350 ml  Net          2343.55 ml   Filed Weights   12/04/15 2124 12/05/15 0613  Weight: 89.7 kg (197 lb 11.2 oz) 91.8 kg (202 lb 6.4 oz)    Examination:  General exam: ill appearing Respiratory system: Clear to auscultation. Respiratory effort normal. Cardiovascular system: S1 & S2 heard, RRR. No JVD, murmurs, rubs, gallops or clicks. No pedal edema. Gastrointestinal system: tender to palpation in RUQ Central nervous system: Alert and oriented. No focal neurological deficits.     Data Reviewed: I have personally reviewed following labs and imaging studies  CBC:  Recent Labs Lab 12/04/15 2323 12/05/15 0530  WBC 10.1 19.0*  NEUTROABS 9.4*  --   HGB 10.9* 10.5*  HCT 33.5* 32.6*  MCV 94.4 94.8  PLT 124* 323*   Basic Metabolic Panel:  Recent Labs Lab 12/04/15 2323 12/05/15 0530  NA 137 139  K 4.2 4.5  CL 110 112*  CO2 20* 18*  GLUCOSE 91 81  BUN 27* 28*  CREATININE 2.39* 2.37*  CALCIUM 8.3* 7.7*   GFR: Estimated Creatinine Clearance: 25.1 mL/min (by C-G formula based  on SCr of 2.37 mg/dL (H)). Liver Function Tests:  Recent Labs Lab 12/04/15 2323 12/05/15 0530  AST 1,432* 873*  ALT 840* 751*  ALKPHOS 120 109  BILITOT 1.1 1.2  PROT 5.5* 5.6*  ALBUMIN 2.9* 2.9*   No results for input(s): LIPASE, AMYLASE in the last 168 hours. No results for input(s): AMMONIA in the last 168 hours. Coagulation Profile: No results for input(s): INR, PROTIME in the last 168 hours. Cardiac Enzymes:  Recent Labs Lab 12/04/15 2323 12/05/15 0239 12/05/15 0530  TROPONINI 0.28* 0.32* 0.34*   BNP (last 3 results) No results for input(s): PROBNP in the last 8760 hours. HbA1C: No results for input(s): HGBA1C in the last 72 hours. CBG: No results for input(s): GLUCAP in the last 168 hours. Lipid Profile: No results for input(s): CHOL, HDL, LDLCALC, TRIG, CHOLHDL, LDLDIRECT in the last 72 hours. Thyroid Function Tests: No results for input(s): TSH, T4TOTAL, FREET4, T3FREE, THYROIDAB in the last 72 hours. Anemia Panel: No results for input(s): VITAMINB12, FOLATE, FERRITIN, TIBC, IRON, RETICCTPCT in the last 72 hours. Urine analysis:    Component Value Date/Time   COLORURINE YELLOW 02/08/2012 1227   APPEARANCEUR CLOUDY (A) 02/08/2012 1227   LABSPEC 1.014 02/08/2012 1227   PHURINE 5.0 02/08/2012 1227   GLUCOSEU NEGATIVE 02/08/2012 1227   HGBUR NEGATIVE 02/08/2012 1227   BILIRUBINUR NEGATIVE 02/08/2012 1227   KETONESUR NEGATIVE 02/08/2012 1227   PROTEINUR NEGATIVE 02/08/2012 1227   UROBILINOGEN 0.2 02/08/2012 1227   NITRITE NEGATIVE 02/08/2012 1227   LEUKOCYTESUR NEGATIVE 02/08/2012 1227     ) Recent Results (from the past 240 hour(s))  Culture, blood (x 2)     Status: None (Preliminary result)   Collection Time: 12/04/15 11:29 PM  Result Value Ref Range Status   Specimen Description BLOOD LEFT HAND  Final   Special Requests BOTTLES DRAWN AEROBIC AND ANAEROBIC 5CC EA  Final   Culture PENDING  Incomplete   Report Status PENDING  Incomplete       Anti-infectives    Start     Dose/Rate Route Frequency Ordered Stop   12/05/15 1400  piperacillin-tazobactam (ZOSYN) IVPB 3.375 g     3.375 g 12.5 mL/hr over 240 Minutes Intravenous Daily 12/05/15 0741     12/05/15 0745  piperacillin-tazobactam (ZOSYN) IVPB 3.375 g     3.375 g 100  mL/hr over 30 Minutes Intravenous  Once 12/05/15 0741 12/05/15 0846   12/04/15 2245  azithromycin (ZITHROMAX) tablet 500 mg  Status:  Discontinued     500 mg Oral Daily 12/04/15 2240 12/05/15 0737   12/04/15 2245  cefTRIAXone (ROCEPHIN) 1 g in dextrose 5 % 50 mL IVPB  Status:  Discontinued     1 g 100 mL/hr over 30 Minutes Intravenous Daily 12/04/15 2240 12/05/15 0737   12/04/15 2215  cefTRIAXone (ROCEPHIN) 1 g in dextrose 5 % 50 mL IVPB  Status:  Discontinued     1 g 100 mL/hr over 30 Minutes Intravenous  Once 12/04/15 2211 12/04/15 2217   12/04/15 2215  azithromycin (ZITHROMAX) 500 mg in dextrose 5 % 250 mL IVPB  Status:  Discontinued     500 mg 250 mL/hr over 60 Minutes Intravenous  Once 12/04/15 2211 12/04/15 2217       Radiology Studies: US Abdomen Limited  Result Date: 12/05/2015 CLINICAL DATA:  Elevated liver function studies. Right upper quadrant pain for 2 days. History of hypertension and chronic kidney disease. EXAM: US ABDOMEN LIMITED - RIGHT UPPER QUADRANT COMPARISON:  None. FINDINGS: Gallbladder: Cholelithiasis with several stones in the gallbladder and extending to the gallbladder neck. Largest stone measures about 1 cm diameter. Mild gallbladder wall thickening at 3.8 mm maximum. Mild pericholecystic edema. Murphy's sign is negative. Common bile duct: Diameter: 3.5 mm, normal Liver: Mild diffusely increased liver parenchymal echotexture suggesting fatty infiltration. No focal lesions identified. IMPRESSION: Cholelithiasis with mild gallbladder wall thickening and edema. Although Murphy's sign is negative, changes may indicate acute cholecystitis in the appropriate clinical setting. No bile  duct dilatation. Electronically Signed   By: Lucienne Capers M.D.   On: 12/05/2015 00:19        Scheduled Meds: . aspirin EC  81 mg Oral Daily  . atorvastatin  80 mg Oral QHS  . enoxaparin (LOVENOX) injection  30 mg Subcutaneous Q24H  . iron polysaccharides  150 mg Oral Daily  . piperacillin-tazobactam (ZOSYN)  IV  3.375 g Intravenous Q1400  . sodium bicarbonate  650 mg Oral BID  . sodium chloride flush  3 mL Intravenous Q12H   Continuous Infusions: . sodium chloride 125 mL/hr at 12/05/15 0510     LOS: 1 day    Time spent: 35 min    [place], DO [facility] Pager [phone]  If 7PM-7AM, please contact night-coverage [email or web address] Password [facility] 12/05/2015, 10:33 AM

## 2015-12-05 NOTE — Consult Note (Signed)
Reason for Consult:   Chest pain, elevated Troponin  Requesting Physician: Dr Eliseo Squires Primary Cardiologist Dr Domenic Polite 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 He had an LAD PCI with DES in Jan 2014. At that time he had CTO of the RCA and moderate dCFX disease. He was re studied in Sept 2016 and the LAD site was patent. There was no change in his residual CAD. Echo then showed normal LVF. His LOV was July 2017 and he was stable from a cardiac standpoint.   He presented to [facility] 12/04/15 pm with SSCP. The pt says he had sudden mid sternal "tightness" similar to his pre PCI symptoms. He took NTG with some relief but his symptoms recurred associated with shaking chills and he was transported to the ED at [facility] by EMS.  In the ED he was noted to be febrile (101.2) and sepsis was suspected. His LFTs came back elevated as well as his SCr (2.39) and troponin (0.34). EKG showed NSR with bigeminy. He has been pain free since he arrived to the ED. The pt's family requested transfer to [facility]. Abd [place] this AM confirms cholecystitis. The plan is for GB drain to be placed.   PMHx:  Past Medical History:  Diagnosis Date  . Arthritis   . CAD (coronary artery disease)    a. s/p DES to LAD (2014)  b. s/p LHC (09/2014) with patent LAD stent and stable dz from previous cath   . Chronic anemia   . CKD (chronic kidney disease), stage IV ([place])   . Claudication ([place])    R leg with bilateral femoral bruits.  . Dyslipidemia   . Essential hypertension, benign   . Tobacco abuse     Past Surgical History:  Procedure Laterality Date  . Bilateral carpal tunnel release Bilateral   . CARDIAC CATHETERIZATION N/A 10/10/2014   Procedure: Left Heart Cath and Coronary Angiography;  Surgeon: Belva Crome, MD;  Location: [place] CV [facility];  Service: Cardiovascular;  Laterality: N/A;  . LEFT HEART CATHETERIZATION WITH CORONARY  ANGIOGRAM N/A 02/09/2012   Procedure: LEFT HEART CATHETERIZATION WITH CORONARY ANGIOGRAM;  Surgeon: Peter M Martinique, MD; left main 10%, LAD 80%/95%, D1 patent, CFX 70% at OM 2, dCFX 90%, RCA 100% with L-R collaterals    . PERCUTANEOUS CORONARY STENT INTERVENTION (PCI-S) N/A 02/11/2012   Procedure: PERCUTANEOUS CORONARY STENT INTERVENTION (PCI-S);  Surgeon: Burnell Blanks, MD; 3.0 x 28 mm Promus Premier DES to the mid LAD   . REPAIR OF PERFORATED ULCER      SOCHx:  reports that he quit smoking about 3 years ago. His smoking use included Cigarettes. He started smoking about 47 years ago. He has a 22.50 pack-year smoking history. He has never used smokeless tobacco. He reports that he does not drink alcohol or use drugs.  FAMHx: Family History  Problem Relation Age of Onset  . Hypertension Mother   . Alzheimer's disease Mother   . Other Father     brain tumor  . Heart disease Brother   . Heart disease Sister     ALLERGIES: No Known Allergies  ROS: Review of Systems: General: negative for chills, fever, night sweats or weight changes.  Cardiovascular: negative for chest pain, dyspnea on exertion, edema, orthopnea, palpitations, paroxysmal nocturnal dyspnea or shortness of breath HEENT: negative for any visual disturbances, blindness, glaucoma Dermatological:  negative for rash Respiratory: negative for cough, hemoptysis, or wheezing Urologic: negative for hematuria or dysuria Abdominal: negative for nausea, vomiting, diarrhea, bright red blood per rectum, melena, or hematemesis Neurologic: negative for visual changes, syncope, or dizziness Musculoskeletal: negative for back pain, joint pain, or swelling Psych: cooperative and appropriate All other systems reviewed and are otherwise negative except as noted above.   HOME MEDICATIONS: Prior to Admission medications   Medication Sig Start Date End Date Taking? Authorizing Provider  amLODipine (NORVASC) 10 MG tablet Take 10 mg  by mouth daily.   Yes Historical Provider, MD  aspirin EC 81 MG tablet Take 81 mg by mouth daily.   Yes Historical Provider, MD  atorvastatin (LIPITOR) 80 MG tablet TAKE ONE TABLET BY MOUTH AT BEDTIME Patient taking differently: TAKE 80 mg BY MOUTH AT BEDTIME 11/06/14  Yes Satira Sark, MD  Cholecalciferol (VITAMIN D-3) 1000 UNITS CAPS Take 1,000 Units by mouth daily.    Yes Historical Provider, MD  hydrALAZINE (APRESOLINE) 100 MG tablet TAKE ONE TABLET BY MOUTH THREE TIMES DAILY. Patient taking differently: TAKE 100 mg BY MOUTH twice TIMES DAILY. 11/23/14  Yes Satira Sark, MD  isosorbide mononitrate (IMDUR) 60 MG 24 hr tablet TAKE ONE TABLET BY MOUTH DAILY. Patient taking differently: TAKE 60 mg BY MOUTH DAILY. 02/19/15  Yes Satira Sark, MD  lisinopril (PRINIVIL,ZESTRIL) 5 MG tablet Take 5 mg by mouth daily. 07/04/15  Yes Historical Provider, MD  metoprolol tartrate (LOPRESSOR) 25 MG tablet Take 0.5 tablets (12.5 mg total) by mouth 2 (two) times daily. 01/24/15  Yes Satira Sark, MD  nitroGLYCERIN (NITROSTAT) 0.4 MG SL tablet Place 1 tablet (0.4 mg total) under the tongue every 5 (five) minutes as needed for chest pain (up to 3 doses). 07/30/15  Yes Satira Sark, MD  Polysaccharide Iron Complex (POLY-IRON 150 PO) Take 150 mg by mouth daily.   Yes Historical Provider, MD  sodium bicarbonate 650 MG tablet Take 1 tablet (650 mg total) by mouth 2 (two) times daily. 02/12/12  Yes [place] MEDICATIONS: I have reviewed the patient's current medications.  VITALS: Blood pressure (!) 105/57, pulse 77, temperature 97.8 F (36.6 C), temperature source Oral, resp. rate 18, height 5\' 6"  (1.676 m), weight 202 lb 6.4 oz (91.8 kg), SpO2 97 %.  PHYSICAL EXAM: General appearance: alert, cooperative, no distress and moderately obese Neck: no carotid bruit and no JVD Lungs: clear to auscultation bilaterally Heart: regular rate and rhythm Abdomen: obese, RUQ  tenderness Extremities: extremities normal, atraumatic, no cyanosis or edema Pulses: 2+ and symmetric Skin: Skin color, texture, turgor normal. No rashes or lesions Neurologic: Grossly normal  LABS: Results for orders placed or performed during the hospital encounter of 12/04/15 (from the past 24 hour(s))  Procalcitonin - Baseline     Status: None   Collection Time: 12/04/15 11:23 PM  Result Value Ref Range   Procalcitonin >175.00 ng/mL  Lactic acid, plasma     Status: None   Collection Time: 12/04/15 11:23 PM  Result Value Ref Range   Lactic Acid, Venous 1.9 0.5 - 1.9 mmol/L  Comprehensive metabolic panel     Status: Abnormal   Collection Time: 12/04/15 11:23 PM  Result Value Ref Range   Sodium 137 135 - 145 mmol/L   Potassium 4.2 3.5 - 5.1 mmol/L   Chloride 110 101 - 111 mmol/L   CO2 20 (L) 22 - 32 mmol/L   Glucose, Bld 91 65 -  99 mg/dL   BUN 27 (H) 6 - 20 mg/dL   Creatinine, Ser 2.39 (H) 0.61 - 1.24 mg/dL   Calcium 8.3 (L) 8.9 - 10.3 mg/dL   Total Protein 5.5 (L) 6.5 - 8.1 g/dL   Albumin 2.9 (L) 3.5 - 5.0 g/dL   AST 1,432 (H) 15 - 41 U/L   ALT 840 (H) 17 - 63 U/L   Alkaline Phosphatase 120 38 - 126 U/L   Total Bilirubin 1.1 0.3 - 1.2 mg/dL   GFR calc non Af Amer 24 (L) >60 mL/min   GFR calc Af Amer 27 (L) >60 mL/min   Anion gap 7 5 - 15  CBC with Differential     Status: Abnormal   Collection Time: 12/04/15 11:23 PM  Result Value Ref Range   WBC 10.1 4.0 - 10.5 K/uL   RBC 3.55 (L) 4.22 - 5.81 MIL/uL   Hemoglobin 10.9 (L) 13.0 - 17.0 g/dL   HCT 33.5 (L) 39.0 - 52.0 %   MCV 94.4 78.0 - 100.0 fL   MCH 30.7 26.0 - 34.0 pg   MCHC 32.5 30.0 - 36.0 g/dL   RDW 14.1 11.5 - 15.5 %   Platelets 124 (L) 150 - 400 K/uL   Neutrophils Relative % 93 %   Lymphocytes Relative 4 %   Monocytes Relative 3 %   Eosinophils Relative 0 %   Basophils Relative 0 %   Neutro Abs 9.4 (H) 1.7 - 7.7 K/uL   Lymphs Abs 0.4 (L) 0.7 - 4.0 K/uL   Monocytes Absolute 0.3 0.1 - 1.0 K/uL    Eosinophils Absolute 0.0 0.0 - 0.7 K/uL   Basophils Absolute 0.0 0.0 - 0.1 K/uL   RBC Morphology POLYCHROMASIA PRESENT    WBC Morphology DOHLE BODIES   Culture, blood (x 2)     Status: None (Preliminary result)   Collection Time: 12/04/15 11:23 PM  Result Value Ref Range   Specimen Description BLOOD LEFT ANTECUBITAL    Special Requests BOTTLES DRAWN AEROBIC ONLY 8CC    Culture NO GROWTH < 12 HOURS    Report Status PENDING   Troponin I (q 6hr x 3)     Status: Abnormal   Collection Time: 12/04/15 11:23 PM  Result Value Ref Range   Troponin I 0.28 (HH) <0.03 ng/mL  Culture, blood (x 2)     Status: None (Preliminary result)   Collection Time: 12/04/15 11:29 PM  Result Value Ref Range   Specimen Description BLOOD LEFT HAND    Special Requests BOTTLES DRAWN AEROBIC AND ANAEROBIC 5CC EA    Culture NO GROWTH < 12 HOURS    Report Status PENDING   Lactic acid, plasma     Status: Abnormal   Collection Time: 12/05/15  2:39 AM  Result Value Ref Range   Lactic Acid, Venous 3.0 (HH) 0.5 - 1.9 mmol/L  Troponin I     Status: Abnormal   Collection Time: 12/05/15  2:39 AM  Result Value Ref Range   Troponin I 0.32 (HH) <0.03 ng/mL  CBC     Status: Abnormal   Collection Time: 12/05/15  5:30 AM  Result Value Ref Range   WBC 19.0 (H) 4.0 - 10.5 K/uL   RBC 3.44 (L) 4.22 - 5.81 MIL/uL   Hemoglobin 10.5 (L) 13.0 - 17.0 g/dL   HCT 32.6 (L) 39.0 - 52.0 %   MCV 94.8 78.0 - 100.0 fL   MCH 30.5 26.0 - 34.0 pg   MCHC 32.2 30.0 - 36.0  g/dL   RDW 14.3 11.5 - 15.5 %   Platelets 120 (L) 150 - 400 K/uL  Comprehensive metabolic panel     Status: Abnormal   Collection Time: 12/05/15  5:30 AM  Result Value Ref Range   Sodium 139 135 - 145 mmol/L   Potassium 4.5 3.5 - 5.1 mmol/L   Chloride 112 (H) 101 - 111 mmol/L   CO2 18 (L) 22 - 32 mmol/L   Glucose, Bld 81 65 - 99 mg/dL   BUN 28 (H) 6 - 20 mg/dL   Creatinine, Ser 2.37 (H) 0.61 - 1.24 mg/dL   Calcium 7.7 (L) 8.9 - 10.3 mg/dL   Total Protein 5.6 (L) 6.5  - 8.1 g/dL   Albumin 2.9 (L) 3.5 - 5.0 g/dL   AST 873 (H) 15 - 41 U/L   ALT 751 (H) 17 - 63 U/L   Alkaline Phosphatase 109 38 - 126 U/L   Total Bilirubin 1.2 0.3 - 1.2 mg/dL   GFR calc non Af Amer 24 (L) >60 mL/min   GFR calc Af Amer 28 (L) >60 mL/min   Anion gap 9 5 - 15  Troponin I     Status: Abnormal   Collection Time: 12/05/15  5:30 AM  Result Value Ref Range   Troponin I 0.34 (HH) <0.03 ng/mL  Lactic acid, plasma     Status: None   Collection Time: 12/05/15  5:30 AM  Result Value Ref Range   Lactic Acid, Venous 1.5 0.5 - 1.9 mmol/L  Urinalysis, Routine w reflex microscopic (not at [facility])     Status: Abnormal   Collection Time: 12/05/15  9:30 AM  Result Value Ref Range   Color, Urine AMBER (A) YELLOW   APPearance CLOUDY (A) CLEAR   Specific Gravity, Urine 1.021 1.005 - 1.030   pH 5.0 5.0 - 8.0   Glucose, UA NEGATIVE NEGATIVE mg/dL   Hgb urine dipstick NEGATIVE NEGATIVE   Bilirubin Urine SMALL (A) NEGATIVE   Ketones, ur NEGATIVE NEGATIVE mg/dL   Protein, ur 30 (A) NEGATIVE mg/dL   Nitrite NEGATIVE NEGATIVE   Leukocytes, UA SMALL (A) NEGATIVE  Urine microscopic-add on     Status: Abnormal   Collection Time: 12/05/15  9:30 AM  Result Value Ref Range   Squamous Epithelial / LPF 0-5 (A) NONE SEEN   WBC, UA 6-30 0 - 5 WBC/hpf   RBC / HPF NONE SEEN 0 - 5 RBC/hpf   Bacteria, UA MANY (A) NONE SEEN   Casts GRANULAR CAST (A) NEGATIVE    EKG: NSR, bigeminy  IMAGING: US Abdomen Limited  Result Date: 12/05/2015 CLINICAL DATA:  Elevated liver function studies. Right upper quadrant pain for 2 days. History of hypertension and chronic kidney disease. EXAM: US ABDOMEN LIMITED - RIGHT UPPER QUADRANT COMPARISON:  None. FINDINGS: Gallbladder: Cholelithiasis with several stones in the gallbladder and extending to the gallbladder neck. Largest stone measures about 1 cm diameter. Mild gallbladder wall thickening at 3.8 mm maximum. Mild pericholecystic edema. Murphy's sign is negative.  Common bile duct: Diameter: 3.5 mm, normal Liver: Mild diffusely increased liver parenchymal echotexture suggesting fatty infiltration. No focal lesions identified. IMPRESSION: Cholelithiasis with mild gallbladder wall thickening and edema. Although Murphy's sign is negative, changes may indicate acute cholecystitis in the appropriate clinical setting. No bile duct dilatation. Electronically Signed   By: Lucienne Capers M.D.   On: 12/05/2015 00:19    IMPRESSION: Principal Problem:   Chest pain Active Problems:   Sepsis ([place])   Acute  cholecystitis   Troponin level elevated    Acute on chronic renal insufficiency   CKD stage 4,    Essential hypertension, benign   Peripheral arterial disease (HCC)   Anemia of chronic disease   CAD- S/P PCI 2014   Sepsis, unspecified organism ([place])   Elevated LFTs    RECOMMENDATION: MD to see. I think his SSCP on admission was secondary to acute cholecystis (though it sounds typical for [place]). I suspect his elevated Troponin is from demand ischemia insetting of sepsis. Will check another EKG today. Echo ordered by primary service. Would continue low dose beta blocker (IV) and nitrate (topical).  ACE and statin stopped. IV Hydralazine prn HTN. We will follow with you.   Time Spent Directly with Patient: 50 minutes  Kerin Ransom, [place] beeper 12/05/2015, 10:51 AM   History and all data above reviewed.  Patient examined.  I agree with the findings as above.   He did have chest pain that was similar to his previous angina.  He presented with a mildly abnormal CXR.  However, had no acute EKG changes.  He is currently pain free and found to have acute cholecystitis.  He is to have percutaneous drainage. The patient exam reveals COR:RRR  ,  Lungs: Clear  ,  Abd: Positive bowel sounds, no rebound no guarding, Ext No edema  .  All available labs, radiology testing, previous records reviewed. Agree with documented assessment and plan. Elevated troponin:  I  do suspect that some of his symptoms were cardiac but secondary to the stress of his acute infection.  He also has CKD which clouds the specificity of the enzymes.  However, he is not having any acute EKG changes and no further symptoms.  OK to proceed with the planned procedure.  Continue current therapy.   Given increased creat I agree with hold the ACE inhibitor. We will follow with you.  No plans for further testing other than echocardiogram at this time.      Jeneen Rinks Floella Ensz  12:50 PM  12/05/2015

## 2015-12-05 NOTE — Progress Notes (Signed)
[name] is negative for cholecystitis.  I have contacted general surgery to inform this of this finding.  We will not plan to place a perc chole drain given the patient does not have evidence of cholecystitis on imaging.  Further plans for primary service and surgery.  Kevin [name] E 3:26 PM 12/05/2015

## 2015-12-05 NOTE — Progress Notes (Signed)
Pharmacy Antibiotic Note  Kevin [name] is a 82 y.o. male admitted on 12/04/2015 with sepsis of unknown source, possibly PNA vs intra-abdominal, now w/ US showing cholelithiasis.  Pharmacy has been consulted for Zosyn dosing.  Plan: Zosyn 3.375g IV q8h (4 hour infusion).  Height: 5\' 6"  (167.6 cm) Weight: 202 lb 6.4 oz (91.8 kg) IBW/kg (Calculated) : 63.8  Temp (24hrs), Avg:98.1 F (36.7 C), Min:97.7 F (36.5 C), Max:98.7 F (37.1 C)   Recent Labs Lab 12/04/15 2323 12/05/15 0239 12/05/15 0530  WBC 10.1  --  19.0*  CREATININE 2.39*  --  2.37*  LATICACIDVEN 1.9 3.0* 1.5    Estimated Creatinine Clearance: 25.1 mL/min (by C-G formula based on SCr of 2.37 mg/dL (H)).    No Known Allergies   Thank you for allowing pharmacy to be a part of this patient's care.  Wynona Neat, PharmD, BCPS  12/05/2015 7:38 AM

## 2015-12-05 NOTE — Plan of Care (Signed)
Problem: Education: Goal: Knowledge of [place] [facility] Education information/materials will improve Outcome: Progressing Patient aware of plan of care.  RN provided medication education on all medications administered thus far this shift.  Patient stated understanding.  RN instructed patient to call and wait for staff assistance prior to getting out of bed.  Patient stated understanding.

## 2015-12-05 NOTE — Progress Notes (Signed)
Patient has nitro paste ordered. Patient states that he has not had any chest pain since admission and states that he does not want the nitro paste. Documented in [facility].

## 2015-12-05 NOTE — Progress Notes (Signed)
RN text paged K. Schorr with [facility] pertaining to patient's Troponin of 0.28.  Per [facility] Blinks text page Dr. Loleta Books with value.  RN text paged Dr. Loleta Books via Amion with recent Troponin result.

## 2015-12-05 NOTE — Progress Notes (Signed)
Patient recent lactic acid per lab telephone call is 3.0, Dr. Loleta Books text paged with result via [place].

## 2015-12-06 ENCOUNTER — Inpatient Hospital Stay (HOSPITAL_COMMUNITY): Payer: Medicare Other

## 2015-12-06 DIAGNOSIS — R079 Chest pain, unspecified: Secondary | ICD-10-CM

## 2015-12-06 DIAGNOSIS — N179 Acute kidney failure, unspecified: Secondary | ICD-10-CM

## 2015-12-06 DIAGNOSIS — R0789 Other chest pain: Secondary | ICD-10-CM

## 2015-12-06 DIAGNOSIS — R7989 Other specified abnormal findings of blood chemistry: Secondary | ICD-10-CM

## 2015-12-06 DIAGNOSIS — R109 Unspecified abdominal pain: Secondary | ICD-10-CM

## 2015-12-06 LAB — COMPREHENSIVE METABOLIC PANEL
ALBUMIN: 2.6 g/dL — AB (ref 3.5–5.0)
ALK PHOS: 86 U/L (ref 38–126)
ALT: 394 U/L — ABNORMAL HIGH (ref 17–63)
AST: 272 U/L — AB (ref 15–41)
Anion gap: 6 (ref 5–15)
BILIRUBIN TOTAL: 0.8 mg/dL (ref 0.3–1.2)
BUN: 28 mg/dL — AB (ref 6–20)
CALCIUM: 7.7 mg/dL — AB (ref 8.9–10.3)
CO2: 19 mmol/L — ABNORMAL LOW (ref 22–32)
Chloride: 118 mmol/L — ABNORMAL HIGH (ref 101–111)
Creatinine, Ser: 2.34 mg/dL — ABNORMAL HIGH (ref 0.61–1.24)
GFR calc Af Amer: 28 mL/min — ABNORMAL LOW (ref >60)
GFR, EST NON AFRICAN AMERICAN: 24 mL/min — AB (ref >60)
GLUCOSE: 75 mg/dL (ref 65–99)
Potassium: 4.3 mmol/L (ref 3.5–5.1)
Sodium: 143 mmol/L (ref 135–145)
TOTAL PROTEIN: 5.3 g/dL — AB (ref 6.5–8.1)

## 2015-12-06 LAB — HEPATITIS PANEL, ACUTE
HCV Ab: <0.1 s/co ratio (ref 0.0–0.9)
HEP B C IGM: NEGATIVE
Hep A IgM: NEGATIVE
Hepatitis B Surface Ag: NEGATIVE

## 2015-12-06 LAB — ECHOCARDIOGRAM COMPLETE
Height: 66 in
Weight: 3241.6 oz

## 2015-12-06 LAB — URINE CULTURE: Culture: NO GROWTH

## 2015-12-06 LAB — CBC
HEMATOCRIT: 31.3 % — AB (ref 39.0–52.0)
HEMOGLOBIN: 9.8 g/dL — AB (ref 13.0–17.0)
MCH: 29.9 pg (ref 26.0–34.0)
MCHC: 31.3 g/dL (ref 30.0–36.0)
MCV: 95.4 fL (ref 78.0–100.0)
Platelets: 111 10*3/uL — ABNORMAL LOW (ref 150–400)
RBC: 3.28 MIL/uL — ABNORMAL LOW (ref 4.22–5.81)
RDW: 14.6 % (ref 11.5–15.5)
WBC: 17.3 10*3/uL — ABNORMAL HIGH (ref 4.0–10.5)

## 2015-12-06 LAB — PROCALCITONIN

## 2015-12-06 MED ORDER — DEXTROSE 5 % IV SOLN
1.0000 g | INTRAVENOUS | Status: DC
  Administered 2015-12-06 – 2015-12-07 (×2): 1 g via INTRAVENOUS
  Filled 2015-12-06 (×3): qty 10

## 2015-12-06 MED ORDER — AMLODIPINE BESYLATE 10 MG PO TABS
10.0000 mg | ORAL_TABLET | Freq: Every day | ORAL | Status: DC
  Administered 2015-12-06 – 2015-12-08 (×3): 10 mg via ORAL
  Filled 2015-12-06 (×3): qty 1

## 2015-12-06 MED ORDER — HYDROCODONE-ACETAMINOPHEN 5-325 MG PO TABS
1.0000 | ORAL_TABLET | Freq: Four times a day (QID) | ORAL | Status: DC | PRN
  Administered 2015-12-08: 1 via ORAL
  Filled 2015-12-06: qty 1

## 2015-12-06 MED ORDER — HYDRALAZINE HCL 50 MG PO TABS
50.0000 mg | ORAL_TABLET | Freq: Three times a day (TID) | ORAL | Status: DC
  Administered 2015-12-06 – 2015-12-08 (×6): 50 mg via ORAL
  Filled 2015-12-06 (×6): qty 1

## 2015-12-06 MED ORDER — DEXTROSE 5 % IV SOLN
500.0000 mg | INTRAVENOUS | Status: DC
  Administered 2015-12-06 – 2015-12-07 (×2): 500 mg via INTRAVENOUS
  Filled 2015-12-06 (×3): qty 500

## 2015-12-06 MED ORDER — ISOSORBIDE MONONITRATE ER 60 MG PO TB24
60.0000 mg | ORAL_TABLET | Freq: Every day | ORAL | Status: DC
  Administered 2015-12-06 – 2015-12-08 (×3): 60 mg via ORAL
  Filled 2015-12-06 (×3): qty 1

## 2015-12-06 MED ORDER — METOPROLOL TARTRATE 12.5 MG HALF TABLET
12.5000 mg | ORAL_TABLET | Freq: Two times a day (BID) | ORAL | Status: DC
  Administered 2015-12-06 – 2015-12-08 (×5): 12.5 mg via ORAL
  Filled 2015-12-06 (×5): qty 1

## 2015-12-06 MED ORDER — ATORVASTATIN CALCIUM 80 MG PO TABS
80.0000 mg | ORAL_TABLET | Freq: Every day | ORAL | Status: DC
  Administered 2015-12-06 – 2015-12-07 (×2): 80 mg via ORAL
  Filled 2015-12-06 (×2): qty 1

## 2015-12-06 NOTE — Progress Notes (Signed)
PROGRESS NOTE    Kevin [name]  VQM:[id number] DOB: [birth year] DOA: 12/04/2015 PCP: Glenda Chroman, MD   Outpatient Specialists:     Brief Narrative:  Kevin [name] is a 82 y.o. male with a past medical history significant for CAD s/p DES to LAD 2014, CKD III-IV, baseline Cr 1.5-1.6, HTN, and anemia of renal disease who presents with chest pressure and fever and LFTs.  The patient was in his normal state of health (except for a mild new cough and some ear discomfort "like an ear ache") until tonight around 4PM, when he was watching television and had sudden onset of central chest discomfort, radiating to the back of his neck, moderate to severe in intensity, with associated SOB and feeling like something "sitting on his chest".  Per EMS, his symptoms improved with O2 and also with nitroglycerin, and he was transported to [facility] ER.      Assessment & Plan:   Principal Problem:   Chest pain Active Problems:   CKD (chronic kidney disease) stage 4, GFR 15-29 ml/min (HCC)   Essential hypertension, benign   Peripheral arterial disease (HCC)   Anemia of chronic disease   CAD- S/P PCI 2014   Sepsis, unspecified organism ([place])   Elevated LFTs   Sepsis ([place])   Acute cholecystitis   Troponin level elevated   Acute on chronic renal insufficiency   Sepsis due to unclear source -does not appear to be gallbladder  -HIDA normal - blood and urine cultures NGTD -lactic acid elevated -negative Resp virus panel - procalcitonin elevated -on zosyn for suspected abdominal source -renal U/S as AKI -hip pain with out effusion/cellulitis-- x ray done -chest xray shows -? Developing PNA/atelectasis-- change zosyn to rocephin/azithromycin  Elevated LFTs: ? Shock liver? - RUQ [place] concerning but HIDA negative -surgery consult appreciated -viral hepatitis negative -Trend CMP  Chest pain:  -resovled -Trend troponin- suspect demand -echo pending -cardiology consult appreciated   HTN and  CAD secondary prevention:  -hold statin for elevated LFTs -Hold lisinopril for  AKI -resume norvasc/BB and imdur  Anemia:  monitor  AKI on CKD III-IV:  Baseline 1.6-1.8.   -Avoid nephrotoxins -Continue oral bicarb   DVT prophylaxis:  Lovenox   Code Status: Full Code   Family Communication: At bedside  Disposition Plan:     Consultants:   Cards  Surgery      Subjective: Main issue is hip pain Mild cough  Objective: Vitals:   12/06/15 0358 12/06/15 0415 12/06/15 0524 12/06/15 0753  BP: (!) 169/75 (!) 163/69 (!) 146/69 (!) 157/72  Pulse: 86 80 74 73  Resp: (!) 30 (!) 22 17 (!) 21  Temp: 98.4 F (36.9 C)   98.7 F (37.1 C)  TempSrc: Oral   Oral  SpO2: 96% 96% 96% 97%  Weight: 91.9 kg (202 lb 9.6 oz)     Height:        Intake/Output Summary (Last 24 hours) at 12/06/15 1130 Last data filed at 12/06/15 0600  Gross per 24 hour  Intake          2683.33 ml  Output                2 ml  Net          2681.33 ml   Filed Weights   12/04/15 2124 12/05/15 0613 12/06/15 0358  Weight: 89.7 kg (197 lb 11.2 oz) 91.8 kg (202 lb 6.4 oz) 91.9 kg (202 lb 9.6 oz)    Examination:  General exam: more interactive,  Respiratory system: no wheezing, diminished at bases Cardiovascular system: S1 & S2 heard, RRR. No JVD, murmurs, rubs, gallops or clicks. No pedal edema. Gastrointestinal system: tender to palpation in RUQ Central nervous system: Alert and oriented. No focal neurological deficits.     Data Reviewed: I have personally reviewed following labs and imaging studies  CBC:  Recent Labs Lab 12/04/15 2323 12/05/15 0530 12/06/15 0501  WBC 10.1 19.0* 17.3*  NEUTROABS 9.4*  --   --   HGB 10.9* 10.5* 9.8*  HCT 33.5* 32.6* 31.3*  MCV 94.4 94.8 95.4  PLT 124* 120* 073*   Basic Metabolic Panel:  Recent Labs Lab 12/04/15 2323 12/05/15 0530 12/06/15 0501  NA 137 139 143  K 4.2 4.5 4.3  CL 110 112* 118*  CO2 20* 18* 19*  GLUCOSE 91 81 75  BUN  27* 28* 28*  CREATININE 2.39* 2.37* 2.34*  CALCIUM 8.3* 7.7* 7.7*   GFR: Estimated Creatinine Clearance: 25.4 mL/min (by C-G formula based on SCr of 2.34 mg/dL (H)). Liver Function Tests:  Recent Labs Lab 12/04/15 2323 12/05/15 0530 12/06/15 0501  AST 1,432* 873* 272*  ALT 840* 751* 394*  ALKPHOS 120 109 86  BILITOT 1.1 1.2 0.8  PROT 5.5* 5.6* 5.3*  ALBUMIN 2.9* 2.9* 2.6*   No results for input(s): LIPASE, AMYLASE in the last 168 hours. No results for input(s): AMMONIA in the last 168 hours. Coagulation Profile: No results for input(s): INR, PROTIME in the last 168 hours. Cardiac Enzymes:  Recent Labs Lab 12/04/15 2323 12/05/15 0239 12/05/15 0530  TROPONINI 0.28* 0.32* 0.34*   BNP (last 3 results) No results for input(s): PROBNP in the last 8760 hours. HbA1C: No results for input(s): HGBA1C in the last 72 hours. CBG: No results for input(s): GLUCAP in the last 168 hours. Lipid Profile: No results for input(s): CHOL, HDL, LDLCALC, TRIG, CHOLHDL, LDLDIRECT in the last 72 hours. Thyroid Function Tests: No results for input(s): TSH, T4TOTAL, FREET4, T3FREE, THYROIDAB in the last 72 hours. Anemia Panel: No results for input(s): VITAMINB12, FOLATE, FERRITIN, TIBC, IRON, RETICCTPCT in the last 72 hours. Urine analysis:    Component Value Date/Time   COLORURINE AMBER (A) 12/05/2015 0930   APPEARANCEUR CLOUDY (A) 12/05/2015 0930   LABSPEC 1.021 12/05/2015 0930   PHURINE 5.0 12/05/2015 0930   GLUCOSEU NEGATIVE 12/05/2015 0930   HGBUR NEGATIVE 12/05/2015 0930   BILIRUBINUR SMALL (A) 12/05/2015 0930   KETONESUR NEGATIVE 12/05/2015 0930   PROTEINUR 30 (A) 12/05/2015 0930   UROBILINOGEN 0.2 02/08/2012 1227   NITRITE NEGATIVE 12/05/2015 0930   LEUKOCYTESUR SMALL (A) 12/05/2015 0930     ) Recent Results (from the past 240 hour(s))  Respiratory Panel by PCR     Status: None   Collection Time: 12/04/15 10:09 PM  Result Value Ref Range Status   Adenovirus NOT DETECTED  NOT DETECTED Final   Coronavirus 229E NOT DETECTED NOT DETECTED Final   Coronavirus HKU1 NOT DETECTED NOT DETECTED Final   Coronavirus NL63 NOT DETECTED NOT DETECTED Final   Coronavirus OC43 NOT DETECTED NOT DETECTED Final   Metapneumovirus NOT DETECTED NOT DETECTED Final   Rhinovirus / Enterovirus NOT DETECTED NOT DETECTED Final   Influenza A NOT DETECTED NOT DETECTED Final   Influenza B NOT DETECTED NOT DETECTED Final   Parainfluenza Virus 1 NOT DETECTED NOT DETECTED Final   Parainfluenza Virus 2 NOT DETECTED NOT DETECTED Final   Parainfluenza Virus 3 NOT DETECTED NOT DETECTED Final  Parainfluenza Virus 4 NOT DETECTED NOT DETECTED Final   Respiratory Syncytial Virus NOT DETECTED NOT DETECTED Final   Bordetella pertussis NOT DETECTED NOT DETECTED Final   Chlamydophila pneumoniae NOT DETECTED NOT DETECTED Final   Mycoplasma pneumoniae NOT DETECTED NOT DETECTED Final  Culture, blood (x 2)     Status: None (Preliminary result)   Collection Time: 12/04/15 11:23 PM  Result Value Ref Range Status   Specimen Description BLOOD LEFT ANTECUBITAL  Final   Special Requests BOTTLES DRAWN AEROBIC ONLY 8CC  Final   Culture NO GROWTH < 12 HOURS  Final   Report Status PENDING  Incomplete  Culture, blood (x 2)     Status: None (Preliminary result)   Collection Time: 12/04/15 11:29 PM  Result Value Ref Range Status   Specimen Description BLOOD LEFT HAND  Final   Special Requests BOTTLES DRAWN AEROBIC AND ANAEROBIC 5CC EA  Final   Culture NO GROWTH < 12 HOURS  Final   Report Status PENDING  Incomplete  Urine culture     Status: None   Collection Time: 12/05/15  9:30 AM  Result Value Ref Range Status   Specimen Description URINE, RANDOM  Final   Special Requests NONE  Final   Culture NO GROWTH  Final   Report Status 12/06/2015 FINAL  Final      Anti-infectives    Start     Dose/Rate Route Frequency Ordered Stop   12/05/15 1600  piperacillin-tazobactam (ZOSYN) IVPB 3.375 g     3.375 g 12.5  mL/hr over 240 Minutes Intravenous Every 8 hours 12/05/15 1432     12/05/15 1400  piperacillin-tazobactam (ZOSYN) IVPB 3.375 g  Status:  Discontinued     3.375 g 12.5 mL/hr over 240 Minutes Intravenous Daily 12/05/15 0741 12/05/15 1432   12/05/15 0745  piperacillin-tazobactam (ZOSYN) IVPB 3.375 g     3.375 g 100 mL/hr over 30 Minutes Intravenous  Once 12/05/15 0741 12/05/15 0846   12/04/15 2245  azithromycin (ZITHROMAX) tablet 500 mg  Status:  Discontinued     500 mg Oral Daily 12/04/15 2240 12/05/15 0737   12/04/15 2245  cefTRIAXone (ROCEPHIN) 1 g in dextrose 5 % 50 mL IVPB  Status:  Discontinued     1 g 100 mL/hr over 30 Minutes Intravenous Daily 12/04/15 2240 12/05/15 0737   12/04/15 2215  cefTRIAXone (ROCEPHIN) 1 g in dextrose 5 % 50 mL IVPB  Status:  Discontinued     1 g 100 mL/hr over 30 Minutes Intravenous  Once 12/04/15 2211 12/04/15 2217   12/04/15 2215  azithromycin (ZITHROMAX) 500 mg in dextrose 5 % 250 mL IVPB  Status:  Discontinued     500 mg 250 mL/hr over 60 Minutes Intravenous  Once 12/04/15 2211 12/04/15 2217       Radiology Studies: Dg Chest 2 View  Result Date: 12/06/2015 CLINICAL DATA:  Fever and shortness of breath today. History of coronary artery disease with stent placement. Former smoker. EXAM: CHEST  2 VIEW COMPARISON:  Portable chest x-ray of August 03, 2015 FINDINGS: The lungs are well-expanded. There is increased density at both lung bases. There is no pleural effusion or pneumothorax. The cardiac silhouette is mildly enlarged. The central pulmonary vascularity is prominent but stable. The mediastinum is normal in width. The bony thorax is unremarkable. IMPRESSION: Increased density at both lung bases consistent with atelectasis or developing pneumonia. Mild cardiomegaly without pulmonary vascular congestion. Followup PA and lateral chest X-ray is recommended in 3-4 weeks following trial of  antibiotic therapy to ensure resolution and exclude underlying malignancy.  Electronically Signed   By: David  Martinique M.D.   On: 12/06/2015 10:53   Nm Hepatobiliary Liver Func  Result Date: 12/05/2015 CLINICAL DATA:  Right upper quadrant abdominal pain. Symptoms for 2 days EXAM: NUCLEAR MEDICINE HEPATOBILIARY IMAGING TECHNIQUE: Sequential images of the abdomen were obtained out to 60 minutes following intravenous administration of radiopharmaceutical. RADIOPHARMACEUTICALS:  5.1 mCi Tc-75m  Choletec IV COMPARISON:  None. FINDINGS: Prompt uptake and biliary excretion of activity by the liver is seen. Gallbladder activity is visualized, consistent with patency of cystic duct. Biliary activity passes into small bowel, consistent with patent common bile duct. IMPRESSION: Normal scan.  Patent cystic and common bile ducts. Electronically Signed   By: Monte Fantasia M.D.   On: 12/05/2015 14:48   US Abdomen Limited  Result Date: 12/05/2015 CLINICAL DATA:  Elevated liver function studies. Right upper quadrant pain for 2 days. History of hypertension and chronic kidney disease. EXAM: US ABDOMEN LIMITED - RIGHT UPPER QUADRANT COMPARISON:  None. FINDINGS: Gallbladder: Cholelithiasis with several stones in the gallbladder and extending to the gallbladder neck. Largest stone measures about 1 cm diameter. Mild gallbladder wall thickening at 3.8 mm maximum. Mild pericholecystic edema. Murphy's sign is negative. Common bile duct: Diameter: 3.5 mm, normal Liver: Mild diffusely increased liver parenchymal echotexture suggesting fatty infiltration. No focal lesions identified. IMPRESSION: Cholelithiasis with mild gallbladder wall thickening and edema. Although Murphy's sign is negative, changes may indicate acute cholecystitis in the appropriate clinical setting. No bile duct dilatation. Electronically Signed   By: Lucienne Capers M.D.   On: 12/05/2015 00:19   Dg Femur, Min 2 Views Right  Result Date: 12/06/2015 CLINICAL DATA:  Proximal right femoral tenderness for the past several days without  known injury. EXAM: RIGHT FEMUR 2 VIEWS COMPARISON:  Right hip series of December 04, 2015 FINDINGS: The bones are subjectively adequately mineralized. There is mild narrowing of the hip joint space. The articular surfaces of the femoral head and acetabulum remains smoothly rounded. The observed portions of the right hemipelvis are normal. The femoral neck, intertrochanteric, and subtrochanteric regions of the femur are normal. There is no significant abnormality of the knee. The soft tissues of the thigh are unremarkable. There is vascular calcification in the superficial femoral artery. IMPRESSION: There is no acute or chronic bony abnormality of the right femur. Very mild osteoarthritic narrowing of the right hip joint space. Electronically Signed   By: David  Martinique M.D.   On: 12/06/2015 10:54        Scheduled Meds: . amLODipine  10 mg Oral Daily  . aspirin EC  81 mg Oral Daily  . atorvastatin  80 mg Oral q1800  . enoxaparin (LOVENOX) injection  30 mg Subcutaneous Q24H  . hydrALAZINE  50 mg Oral Q8H  . isosorbide mononitrate  60 mg Oral Daily  . metoprolol tartrate  12.5 mg Oral BID  . piperacillin-tazobactam (ZOSYN)  IV  3.375 g Intravenous Q8H  . sodium bicarbonate  650 mg Oral BID  . sodium chloride flush  3 mL Intravenous Q12H   Continuous Infusions: . sodium chloride 50 mL/hr (12/06/15 1006)     LOS: 2 days    Time spent: 35 min    [place], DO [facility] Pager [phone]  If 7PM-7AM, please contact night-coverage [email or web address] Password [email or web address] 12/06/2015, 11:30 AM

## 2015-12-06 NOTE — Progress Notes (Signed)
Patient ID: Kevin [name], male   DOB: December 02, 1930, 82 y.o.   MRN: [id number]  [facility] Surgery Progress Note     Subjective: Feeling better today. Denies any current abdominal pain. Tolerating diet. Denies n/v. Complaining of right hip pain. Mild cough, nonproductive.  Objective: Vital signs in last 24 hours: Temp:  [98.4 F (36.9 C)-99.6 F (37.6 C)] 98.7 F (37.1 C) (11/09 0753) Pulse Rate:  [73-86] 73 (11/09 0753) Resp:  [17-30] 21 (11/09 0753) BP: (116-169)/(53-75) 157/72 (11/09 0753) SpO2:  [96 %-100 %] 97 % (11/09 0753) Weight:  [202 lb 9.6 oz (91.9 kg)] 202 lb 9.6 oz (91.9 kg) (11/09 0358) Last BM Date: 12/04/15  Intake/Output from previous day: 11/08 0701 - 11/09 0700 In: 2713.3 [P.O.:30; I.V.:2533.3; IV Piggyback:150] Out: 352 [Urine:351; Stool:1] Intake/Output this shift: No intake/output data recorded.  PE: Gen:  Alert, NAD, pleasant Pulm: effort normal Abd: obese, Soft, NT/ND, +BS, no HSM  Lab Results:   Recent Labs  12/05/15 0530 12/06/15 0501  WBC 19.0* 17.3*  HGB 10.5* 9.8*  HCT 32.6* 31.3*  PLT 120* PENDING   BMET  Recent Labs  12/05/15 0530 12/06/15 0501  NA 139 143  K 4.5 4.3  CL 112* 118*  CO2 18* 19*  GLUCOSE 81 75  BUN 28* 28*  CREATININE 2.37* 2.34*  CALCIUM 7.7* 7.7*   PT/INR No results for input(s): LABPROT, INR in the last 72 hours. CMP     Component Value Date/Time   NA 143 12/06/2015 0501   K 4.3 12/06/2015 0501   CL 118 (H) 12/06/2015 0501   CO2 19 (L) 12/06/2015 0501   GLUCOSE 75 12/06/2015 0501   BUN 28 (H) 12/06/2015 0501   CREATININE 2.34 (H) 12/06/2015 0501   CALCIUM 7.7 (L) 12/06/2015 0501   PROT 5.3 (L) 12/06/2015 0501   ALBUMIN 2.6 (L) 12/06/2015 0501   AST 272 (H) 12/06/2015 0501   ALT 394 (H) 12/06/2015 0501   ALKPHOS 86 12/06/2015 0501   BILITOT 0.8 12/06/2015 0501   GFRNONAA 24 (L) 12/06/2015 0501   GFRAA 28 (L) 12/06/2015 0501   Lipase  No results found for:  LIPASE     Studies/Results: Nm Hepatobiliary Liver Func  Result Date: 12/05/2015 CLINICAL DATA:  Right upper quadrant abdominal pain. Symptoms for 2 days EXAM: NUCLEAR MEDICINE HEPATOBILIARY IMAGING TECHNIQUE: Sequential images of the abdomen were obtained out to 60 minutes following intravenous administration of radiopharmaceutical. RADIOPHARMACEUTICALS:  5.1 mCi Tc-24m Choletec IV COMPARISON:  None. FINDINGS: Prompt uptake and biliary excretion of activity by the liver is seen. Gallbladder activity is visualized, consistent with patency of cystic duct. Biliary activity passes into small bowel, consistent with patent common bile duct. IMPRESSION: Normal scan.  Patent cystic and common bile ducts. Electronically Signed   By: JMonte FantasiaM.D.   On: 12/05/2015 14:48   [place]dom[place] Limited  Result Date: 12/05/2015 CLINICAL DATA:  Elevated liver function studies. Right upper quadrant pain for 2 days. History of hypertension and chronic kidney disease. EXAM: [place]DOM[place] LIMITED - RIGHT UPPER QUADRANT COMPARISON:  None. FINDINGS: Gallbladder: Cholelithiasis with several stones in the gallbladder and extending to the gallbladder neck. Largest stone measures about 1 cm diameter. Mild gallbladder wall thickening at 3.8 mm maximum. Mild pericholecystic edema. Murphy's sign is negative. Common bile duct: Diameter: 3.5 mm, normal Liver: Mild diffusely increased liver parenchymal echotexture suggesting fatty infiltration. No focal lesions identified. IMPRESSION: Cholelithiasis with mild gallbladder wall thickening and edema. Although Murphy's sign is  negative, changes may indicate acute cholecystitis in the appropriate clinical setting. No bile duct dilatation. Electronically Signed   By: Lucienne Capers M.D.   On: 12/05/2015 00:19    Anti-infectives: Anti-infectives    Start     Dose/Rate Route Frequency Ordered Stop   12/05/15 1600  piperacillin-tazobactam (ZOSYN) IVPB 3.375 g     3.375 g 12.5 mL/hr over  240 Minutes Intravenous Every 8 hours 12/05/15 1432     12/05/15 1400  piperacillin-tazobactam (ZOSYN) IVPB 3.375 g  Status:  Discontinued     3.375 g 12.5 mL/hr over 240 Minutes Intravenous Daily 12/05/15 0741 12/05/15 1432   12/05/15 0745  piperacillin-tazobactam (ZOSYN) IVPB 3.375 g     3.375 g 100 mL/hr over 30 Minutes Intravenous  Once 12/05/15 0741 12/05/15 0846   12/04/15 2245  azithromycin (ZITHROMAX) tablet 500 mg  Status:  Discontinued     500 mg Oral Daily 12/04/15 2240 12/05/15 0737   12/04/15 2245  cefTRIAXone (ROCEPHIN) 1 g in dextrose 5 % 50 mL IVPB  Status:  Discontinued     1 g 100 mL/hr over 30 Minutes Intravenous Daily 12/04/15 2240 12/05/15 0737   12/04/15 2215  cefTRIAXone (ROCEPHIN) 1 g in dextrose 5 % 50 mL IVPB  Status:  Discontinued     1 g 100 mL/hr over 30 Minutes Intravenous  Once 12/04/15 2211 12/04/15 2217   12/04/15 2215  azithromycin (ZITHROMAX) 500 mg in dextrose 5 % 250 mL IVPB  Status:  Discontinued     500 mg 250 mL/hr over 60 Minutes Intravenous  Once 12/04/15 2211 12/04/15 2217       Assessment/Plan RUQ abdominal pain - AST/ALT elevation, alk phos and bilirubin normal - RUQ u/s shows cholelithiasis - Hepatitis panel 11/7 negative - blood cultures negative - urine culture negative - HIDA negative - lactic acid was 3.0, now 1.7 - troponin mildly elevated, denies CP - benign abdomen today  Chest pain with hx of DES steny 2014/mild troponin elevation/Off Plavix 1 year Chronic on acute kidney disease Hypertension Hx of claudication Tobacco use 63 years, quit 2 years ago Hx of anemia Hx of dyslipidemia  Plan - HIDA negative and patient with benign abdomen today. LFTs still elevated but trending down. Hepatitis panel negative. GB does not seem to be cause of his sepsis. General surgery will sign off, please call with concerns. Spoke with internal medicine today and they are ordering CXR, renal u/s, ECHO, and hip XR.    LOS: 2 days     Jerrye Beavers , [facility] 12/06/2015, 9:24 AM Pager: [phone] Consults: [phone] Mon-Fri 7:00 am-4:30 pm Sat-Sun 7:00 am-11:30 am

## 2015-12-06 NOTE — Progress Notes (Signed)
  Echocardiogram 2D Echocardiogram has been performed.  Kevin [name] 12/06/2015, 11:47 AM

## 2015-12-06 NOTE — Progress Notes (Signed)
Patient Name: Kevin [name] Date of Encounter: 12/06/2015 Primary Cardiologist: [facility] Problem List     Principal Problem:   Chest pain Active Problems:   CKD (chronic kidney disease) stage 4, GFR 15-29 ml/min (HCC)   Essential hypertension, benign   Peripheral arterial disease (HCC)   Anemia of chronic disease   CAD- S/P PCI 2014   Sepsis, unspecified organism ([place])   Elevated LFTs   Sepsis ([place])   Acute cholecystitis   Troponin level elevated   Acute on chronic renal insufficiency     Subjective   No further chest pain.  He did not want the NTG paste.    Inpatient Medications    Scheduled Meds: . amLODipine  10 mg Oral Daily  . aspirin EC  81 mg Oral Daily  . enoxaparin (LOVENOX) injection  30 mg Subcutaneous Q24H  . metoprolol tartrate  12.5 mg Oral BID  . piperacillin-tazobactam (ZOSYN)  IV  3.375 g Intravenous Q8H  . sodium bicarbonate  650 mg Oral BID  . sodium chloride flush  3 mL Intravenous Q12H   Continuous Infusions: . sodium chloride 50 mL/hr (12/06/15 1006)   PRN Meds: acetaminophen **OR** acetaminophen, hydrALAZINE, HYDROcodone-acetaminophen, HYDROmorphone (DILAUDID) injection, nitroGLYCERIN, ondansetron **OR** ondansetron (ZOFRAN) IV, senna-docusate   Vital Signs    Vitals:   12/06/15 0358 12/06/15 0415 12/06/15 0524 12/06/15 0753  BP: (!) 169/75 (!) 163/69 (!) 146/69 (!) 157/72  Pulse: 86 80 74 73  Resp: (!) 30 (!) 22 17 (!) 21  Temp: 98.4 F (36.9 C)   98.7 F (37.1 C)  TempSrc: Oral   Oral  SpO2: 96% 96% 96% 97%  Weight: 202 lb 9.6 oz (91.9 kg)     Height:        Intake/Output Summary (Last 24 hours) at 12/06/15 1050 Last data filed at 12/06/15 0600  Gross per 24 hour  Intake          2683.33 ml  Output                2 ml  Net          2681.33 ml   Filed Weights   12/04/15 2124 12/05/15 0613 12/06/15 0358  Weight: 197 lb 11.2 oz (89.7 kg) 202 lb 6.4 oz (91.8 kg) 202 lb 9.6 oz (91.9 kg)    Physical Exam    GEN:  NAD.  Neck:  no JVD Cardiac:  Rate and Rhythm, nomurmurs, rubs, or gallops.  traceedema.  Radials/DP/PT 2+  and equal bilaterally.  Respiratory:  Respirations  regular and unlabored, clear to auscultation bilaterally. GI: Soft, nontender, nondistended, BS + x 4. Skin: warm and dry, no rash. Neuro:   Strength and sensation are intact. Psych:  AAOx3.  Normal affect.  Labs    CBC  Recent Labs  12/04/15 2323 12/05/15 0530 12/06/15 0501  WBC 10.1 19.0* 17.3*  NEUTROABS 9.4*  --   --   HGB 10.9* 10.5* 9.8*  HCT 33.5* 32.6* 31.3*  MCV 94.4 94.8 95.4  PLT 124* 120* 696*   Basic Metabolic Panel  Recent Labs  12/05/15 0530 12/06/15 0501  NA 139 143  K 4.5 4.3  CL 112* 118*  CO2 18* 19*  GLUCOSE 81 75  BUN 28* 28*  CREATININE 2.37* 2.34*  CALCIUM 7.7* 7.7*   Liver Function Tests  Recent Labs  12/05/15 0530 12/06/15 0501  AST 873* 272*  ALT 751* 394*  ALKPHOS 109 86  BILITOT 1.2 0.8  PROT 5.6* 5.3*  ALBUMIN 2.9* 2.6*   No results for input(s): LIPASE, AMYLASE in the last 72 hours. Cardiac Enzymes  Recent Labs  12/04/15 2323 12/05/15 0239 12/05/15 0530  TROPONINI 0.28* 0.32* 0.34*   BNP Invalid input(s): POCBNP D-Dimer No results for input(s): DDIMER in the last 72 hours. Hemoglobin A1C No results for input(s): HGBA1C in the last 72 hours. Fasting Lipid Panel No results for input(s): CHOL, HDL, LDLCALC, TRIG, CHOLHDL, LDLDIRECT in the last 72 hours. Thyroid Function Tests No results for input(s): TSH, T4TOTAL, T3FREE, THYROIDAB in the last 72 hours.  Invalid input(s): FREET3  Telemetry    NSR,PVCs - Personally Reviewed  ECG    NA  Radiology    Nm Hepatobiliary Liver Func  Result Date: 12/05/2015 CLINICAL DATA:  Right upper quadrant abdominal pain. Symptoms for 2 days EXAM: NUCLEAR MEDICINE HEPATOBILIARY IMAGING TECHNIQUE: Sequential images of the abdomen were obtained out to 60 minutes following intravenous administration of  radiopharmaceutical. RADIOPHARMACEUTICALS:  5.1 mCi Tc-58m  Choletec IV COMPARISON:  None. FINDINGS: Prompt uptake and biliary excretion of activity by the liver is seen. Gallbladder activity is visualized, consistent with patency of cystic duct. Biliary activity passes into small bowel, consistent with patent common bile duct. IMPRESSION: Normal scan.  Patent cystic and common bile ducts. Electronically Signed   By: Monte Fantasia M.D.   On: 12/05/2015 14:48   US Abdomen Limited  Result Date: 12/05/2015 CLINICAL DATA:  Elevated liver function studies. Right upper quadrant pain for 2 days. History of hypertension and chronic kidney disease. EXAM: US ABDOMEN LIMITED - RIGHT UPPER QUADRANT COMPARISON:  None. FINDINGS: Gallbladder: Cholelithiasis with several stones in the gallbladder and extending to the gallbladder neck. Largest stone measures about 1 cm diameter. Mild gallbladder wall thickening at 3.8 mm maximum. Mild pericholecystic edema. Murphy's sign is negative. Common bile duct: Diameter: 3.5 mm, normal Liver: Mild diffusely increased liver parenchymal echotexture suggesting fatty infiltration. No focal lesions identified. IMPRESSION: Cholelithiasis with mild gallbladder wall thickening and edema. Although Murphy's sign is negative, changes may indicate acute cholecystitis in the appropriate clinical setting. No bile duct dilatation. Electronically Signed   By: Lucienne Capers M.D.   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 He had an LAD PCI with DES in Jan 2014. At that time he had CTO of the RCA and moderate dCFX disease. He was re studied in Sept 2016 and the LAD site was patent. There was no change in his residual CAD. Echo then showed normal LVF. His LOV was July 2017 and he was stable from a cardiac standpoint.  He presented with chest pain.  He did have troponin  elevation but is found to have sepsis.    Assessment & Plan    CAD:  Elevated troponin.    Resume Lipitor.   He did not want NTG past but I will resume Imdur.  His enzyme trend is flat.  I suspect some demand ischemia related to his other acute illnesses.   Echo has been ordered.   CKD Class IV:      Holding ACE inhibitor.  Further work up per primary team.   HTN:   Restarted Imdur.  I will also restart hydralazine at a slightly lower dose than previous.   Sepsis:  Gall bladder is not likely the etiology per  general surgery.  Plan per primary team.    Signed, Minus Breeding, MD  12/06/2015, 10:50 AM

## 2015-12-07 DIAGNOSIS — R1010 Upper abdominal pain, unspecified: Secondary | ICD-10-CM

## 2015-12-07 DIAGNOSIS — R071 Chest pain on breathing: Secondary | ICD-10-CM

## 2015-12-07 LAB — COMPREHENSIVE METABOLIC PANEL
ALT: 259 U/L — ABNORMAL HIGH (ref 17–63)
ANION GAP: 9 (ref 5–15)
AST: 120 U/L — AB (ref 15–41)
Albumin: 2.7 g/dL — ABNORMAL LOW (ref 3.5–5.0)
Alkaline Phosphatase: 87 U/L (ref 38–126)
BILIRUBIN TOTAL: 0.6 mg/dL (ref 0.3–1.2)
BUN: 24 mg/dL — AB (ref 6–20)
CHLORIDE: 118 mmol/L — AB (ref 101–111)
CO2: 16 mmol/L — ABNORMAL LOW (ref 22–32)
Calcium: 8.1 mg/dL — ABNORMAL LOW (ref 8.9–10.3)
Creatinine, Ser: 2.11 mg/dL — ABNORMAL HIGH (ref 0.61–1.24)
GFR, EST AFRICAN AMERICAN: 32 mL/min — AB (ref >60)
GFR, EST NON AFRICAN AMERICAN: 27 mL/min — AB (ref >60)
Glucose, Bld: 88 mg/dL (ref 65–99)
POTASSIUM: 4.4 mmol/L (ref 3.5–5.1)
Sodium: 143 mmol/L (ref 135–145)
TOTAL PROTEIN: 5.3 g/dL — AB (ref 6.5–8.1)

## 2015-12-07 LAB — CBC
HEMATOCRIT: 30.6 % — AB (ref 39.0–52.0)
Hemoglobin: 9.6 g/dL — ABNORMAL LOW (ref 13.0–17.0)
MCH: 30.3 pg (ref 26.0–34.0)
MCHC: 31.4 g/dL (ref 30.0–36.0)
MCV: 96.5 fL (ref 78.0–100.0)
PLATELETS: 111 10*3/uL — AB (ref 150–400)
RBC: 3.17 MIL/uL — ABNORMAL LOW (ref 4.22–5.81)
RDW: 14.9 % (ref 11.5–15.5)
WBC: 15.6 10*3/uL — AB (ref 4.0–10.5)

## 2015-12-07 LAB — HEPATITIS PANEL, ACUTE
HCV Ab: 0.1 s/co ratio (ref 0.0–0.9)
HEP B C IGM: NEGATIVE
HEP B S AG: NEGATIVE
Hep A IgM: NEGATIVE

## 2015-12-07 MED ORDER — GUAIFENESIN ER 600 MG PO TB12
600.0000 mg | ORAL_TABLET | Freq: Two times a day (BID) | ORAL | Status: DC
  Administered 2015-12-07 – 2015-12-08 (×3): 600 mg via ORAL
  Filled 2015-12-07 (×3): qty 1

## 2015-12-07 NOTE — Progress Notes (Signed)
PROGRESS NOTE    Kevin [name]  MRN:[id number] DOB: 01/12/1931 DOA: 12/04/2015 PCP: Dhruv B Vyas, MD   Outpatient Specialists:     Brief Narrative:  Kevin [name] is a 82 y.o. male with a past medical history significant for CAD s/p DES to LAD 2014, CKD III-IV, baseline Cr 1.5-1.6, HTN, and anemia of renal disease who presents with chest pressure and fever and LFTs.  The patient was in his normal state of health (except for a mild new cough and some ear discomfort "like an ear ache") until tonight around 4PM, when he was watching television and had sudden onset of central chest discomfort, radiating to the back of his neck, moderate to severe in intensity, with associated SOB and feeling like something "sitting on his chest".  Per EMS, his symptoms improved with O2 and also with nitroglycerin, and he was transported to [facility].      Assessment & Plan:   Principal Problem:   Chest pain Active Problems:   CKD (chronic kidney disease) stage 4, GFR 15-29 ml/min (HCC)   Essential hypertension, benign   Peripheral arterial disease (HCC)   Anemia of chronic disease   CAD- S/P PCI 2014   Sepsis, unspecified organism (HCC)   Elevated LFTs   Sepsis (HCC)   Acute cholecystitis   Troponin level elevated   Acute on chronic renal insufficiency   Abdominal pain   AKI (acute kidney injury) (HCC)   Sepsis  likely secondary to community acquired pneumonia Chest x-ray shows bibasilar pneumonia. Gallbladder does not appear to be source  -HIDA normal - blood and urine cultures NGTD -lactic acid elevated -negative Resp virus panel - procalcitonin elevated Continue treatment for pneumonia -renal U/S as AKI -hip pain with out effusion/cellulitis-- x ray done   Right upper quadrant pain AST/ALT elevation, alk phos and bilirubin normal - RUQ u/s shows cholelithiasis - Hepatitis panel 11/7 negative - blood cultures negative - urine culture negative - HIDA negative - lactic acid  was 3.0, now 1.7 -viral hepatitis negative -Trend CMP, improving  Chest pain:  -resovled, suspect demand ischemia, Echo without wall motion abnormalities Patient would need a follow-up repeat echo to evaluate pulmonary pressure   HTN and CAD secondary prevention:  -hold statin for elevated LFTs -Hold lisinopril for  AKI -resume norvasc/BB and imdur  Anemia:  monitor  AKI on CKD III-IV:  Baseline 1.6-1.8.   -Avoid nephrotoxins, hold ACE inhibitor -Continue oral bicarb   DVT prophylaxis:  Lovenox   Code Status: Full Code   Family Communication: At bedside  Disposition Plan:  Anticipate discharge tomorrow   Consultants:   Cards  Surgery      Subjective: Resting comfortably, denies any chest pain or shortness of breath  Objective: Vitals:   12/07/15 0509 12/07/15 0515 12/07/15 0737 12/07/15 1141  BP: (!) 158/75 (!) 158/75 (!) 134/59 119/66  Pulse:  80 82 80  Resp:  (!) 22 20 19  Temp:  97.9 F (36.6 C) 98 F (36.7 C) 98.5 F (36.9 C)  TempSrc:  Oral Oral Oral  SpO2:  97% 96% 97%  Weight:  91.9 kg (202 lb 9.6 oz)    Height:        Intake/Output Summary (Last 24 hours) at 12/07/15 1157 Last data filed at 12/07/15 1039  Gross per 24 hour  Intake           2337.5 ml  Output                750 ml  Net           1587.5 ml   Filed Weights   12/05/15 0613 12/06/15 0358 12/07/15 0515  Weight: 91.8 kg (202 lb 6.4 oz) 91.9 kg (202 lb 9.6 oz) 91.9 kg (202 lb 9.6 oz)    Examination:  General exam: more interactive,  Respiratory system: no wheezing, diminished at bases Cardiovascular system: S1 & S2 heard, RRR. No JVD, murmurs, rubs, gallops or clicks. No pedal edema. Gastrointestinal system: tender to palpation in RUQ Central nervous system: Alert and oriented. No focal neurological deficits.     Data Reviewed: I have personally reviewed following labs and imaging studies  CBC:  Recent Labs Lab 12/04/15 2323 12/05/15 0530 12/06/15 0501  12/07/15 0426  WBC 10.1 19.0* 17.3* 15.6*  NEUTROABS 9.4*  --   --   --   HGB 10.9* 10.5* 9.8* 9.6*  HCT 33.5* 32.6* 31.3* 30.6*  MCV 94.4 94.8 95.4 96.5  PLT 124* 120* 111* 111*   Basic Metabolic Panel:  Recent Labs Lab 12/04/15 2323 12/05/15 0530 12/06/15 0501 12/07/15 0426  NA 137 139 143 143  K 4.2 4.5 4.3 4.4  CL 110 112* 118* 118*  CO2 20* 18* 19* 16*  GLUCOSE 91 81 75 88  BUN 27* 28* 28* 24*  CREATININE 2.39* 2.37* 2.34* 2.11*  CALCIUM 8.3* 7.7* 7.7* 8.1*   GFR: Estimated Creatinine Clearance: 28.1 mL/min (by C-G formula based on SCr of 2.11 mg/dL (H)). Liver Function Tests:  Recent Labs Lab 12/04/15 2323 12/05/15 0530 12/06/15 0501 12/07/15 0426  AST 1,432* 873* 272* 120*  ALT 840* 751* 394* 259*  ALKPHOS 120 109 86 87  BILITOT 1.1 1.2 0.8 0.6  PROT 5.5* 5.6* 5.3* 5.3*  ALBUMIN 2.9* 2.9* 2.6* 2.7*   No results for input(s): LIPASE, AMYLASE in the last 168 hours. No results for input(s): AMMONIA in the last 168 hours. Coagulation Profile: No results for input(s): INR, PROTIME in the last 168 hours. Cardiac Enzymes:  Recent Labs Lab 12/04/15 2323 12/05/15 0239 12/05/15 0530  TROPONINI 0.28* 0.32* 0.34*   BNP (last 3 results) No results for input(s): PROBNP in the last 8760 hours. HbA1C: No results for input(s): HGBA1C in the last 72 hours. CBG: No results for input(s): GLUCAP in the last 168 hours. Lipid Profile: No results for input(s): CHOL, HDL, LDLCALC, TRIG, CHOLHDL, LDLDIRECT in the last 72 hours. Thyroid Function Tests: No results for input(s): TSH, T4TOTAL, FREET4, T3FREE, THYROIDAB in the last 72 hours. Anemia Panel: No results for input(s): VITAMINB12, FOLATE, FERRITIN, TIBC, IRON, RETICCTPCT in the last 72 hours. Urine analysis:    Component Value Date/Time   COLORURINE AMBER (A) 12/05/2015 0930   APPEARANCEUR CLOUDY (A) 12/05/2015 0930   LABSPEC 1.021 12/05/2015 0930   PHURINE 5.0 12/05/2015 0930   GLUCOSEU NEGATIVE  12/05/2015 0930   HGBUR NEGATIVE 12/05/2015 0930   BILIRUBINUR SMALL (A) 12/05/2015 0930   KETONESUR NEGATIVE 12/05/2015 0930   PROTEINUR 30 (A) 12/05/2015 0930   UROBILINOGEN 0.2 02/08/2012 1227   NITRITE NEGATIVE 12/05/2015 0930   LEUKOCYTESUR SMALL (A) 12/05/2015 0930     ) Recent Results (from the past 240 hour(s))  Respiratory Panel by PCR     Status: None   Collection Time: 12/04/15 10:09 PM  Result Value Ref Range Status   Adenovirus NOT DETECTED NOT DETECTED Final   Coronavirus 229E NOT DETECTED NOT DETECTED Final   Coronavirus HKU1 NOT DETECTED NOT DETECTED Final   Coronavirus NL63 NOT   DETECTED NOT DETECTED Final   Coronavirus OC43 NOT DETECTED NOT DETECTED Final   Metapneumovirus NOT DETECTED NOT DETECTED Final   Rhinovirus / Enterovirus NOT DETECTED NOT DETECTED Final   Influenza A NOT DETECTED NOT DETECTED Final   Influenza B NOT DETECTED NOT DETECTED Final   Parainfluenza Virus 1 NOT DETECTED NOT DETECTED Final   Parainfluenza Virus 2 NOT DETECTED NOT DETECTED Final   Parainfluenza Virus 3 NOT DETECTED NOT DETECTED Final   Parainfluenza Virus 4 NOT DETECTED NOT DETECTED Final   Respiratory Syncytial Virus NOT DETECTED NOT DETECTED Final   Bordetella pertussis NOT DETECTED NOT DETECTED Final   Chlamydophila pneumoniae NOT DETECTED NOT DETECTED Final   Mycoplasma pneumoniae NOT DETECTED NOT DETECTED Final  Culture, blood (x 2)     Status: None (Preliminary result)   Collection Time: 12/04/15 11:23 PM  Result Value Ref Range Status   Specimen Description BLOOD LEFT ANTECUBITAL  Final   Special Requests BOTTLES DRAWN AEROBIC ONLY 8CC  Final   Culture NO GROWTH 2 DAYS  Final   Report Status PENDING  Incomplete  Culture, blood (x 2)     Status: None (Preliminary result)   Collection Time: 12/04/15 11:29 PM  Result Value Ref Range Status   Specimen Description BLOOD LEFT HAND  Final   Special Requests BOTTLES DRAWN AEROBIC AND ANAEROBIC 5CC EA  Final   Culture NO  GROWTH 2 DAYS  Final   Report Status PENDING  Incomplete  Urine culture     Status: None   Collection Time: 12/05/15  9:30 AM  Result Value Ref Range Status   Specimen Description URINE, RANDOM  Final   Special Requests NONE  Final   Culture NO GROWTH  Final   Report Status 12/06/2015 FINAL  Final      Anti-infectives    Start     Dose/Rate Route Frequency Ordered Stop   12/06/15 1300  cefTRIAXone (ROCEPHIN) 1 g in dextrose 5 % 50 mL IVPB     1 g 100 mL/hr over 30 Minutes Intravenous Every 24 hours 12/06/15 1134     12/06/15 1300  azithromycin (ZITHROMAX) 500 mg in dextrose 5 % 250 mL IVPB     500 mg 250 mL/hr over 60 Minutes Intravenous Every 24 hours 12/06/15 1134     12/05/15 1600  piperacillin-tazobactam (ZOSYN) IVPB 3.375 g  Status:  Discontinued     3.375 g 12.5 mL/hr over 240 Minutes Intravenous Every 8 hours 12/05/15 1432 12/06/15 1134   12/05/15 1400  piperacillin-tazobactam (ZOSYN) IVPB 3.375 g  Status:  Discontinued     3.375 g 12.5 mL/hr over 240 Minutes Intravenous Daily 12/05/15 0741 12/05/15 1432   12/05/15 0745  piperacillin-tazobactam (ZOSYN) IVPB 3.375 g     3.375 g 100 mL/hr over 30 Minutes Intravenous  Once 12/05/15 0741 12/05/15 0846   12/04/15 2245  azithromycin (ZITHROMAX) tablet 500 mg  Status:  Discontinued     500 mg Oral Daily 12/04/15 2240 12/05/15 0737   12/04/15 2245  cefTRIAXone (ROCEPHIN) 1 g in dextrose 5 % 50 mL IVPB  Status:  Discontinued     1 g 100 mL/hr over 30 Minutes Intravenous Daily 12/04/15 2240 12/05/15 0737   12/04/15 2215  cefTRIAXone (ROCEPHIN) 1 g in dextrose 5 % 50 mL IVPB  Status:  Discontinued     1 g 100 mL/hr over 30 Minutes Intravenous  Once 12/04/15 2211 12/04/15 2217   12/04/15 2215  azithromycin (ZITHROMAX) 500 mg in dextrose 5 %  250 mL IVPB  Status:  Discontinued     500 mg 250 mL/hr over 60 Minutes Intravenous  Once 12/04/15 2211 12/04/15 2217       Radiology Studies: Dg Chest 2 View  Result Date:  12/06/2015 CLINICAL DATA:  Fever and shortness of breath today. History of coronary artery disease with stent placement. Former smoker. EXAM: CHEST  2 VIEW COMPARISON:  Portable chest x-ray of August 03, 2015 FINDINGS: The lungs are well-expanded. There is increased density at both lung bases. There is no pleural effusion or pneumothorax. The cardiac silhouette is mildly enlarged. The central pulmonary vascularity is prominent but stable. The mediastinum is normal in width. The bony thorax is unremarkable. IMPRESSION: Increased density at both lung bases consistent with atelectasis or developing pneumonia. Mild cardiomegaly without pulmonary vascular congestion. Followup PA and lateral chest X-ray is recommended in 3-4 weeks following trial of antibiotic therapy to ensure resolution and exclude underlying malignancy. Electronically Signed   By: David  Jordan M.D.   On: 12/06/2015 10:53   Nm Hepatobiliary Liver Func  Result Date: 12/05/2015 CLINICAL DATA:  Right upper quadrant abdominal pain. Symptoms for 2 days EXAM: NUCLEAR MEDICINE HEPATOBILIARY IMAGING TECHNIQUE: Sequential images of the abdomen were obtained out to 60 minutes following intravenous administration of radiopharmaceutical. RADIOPHARMACEUTICALS:  5.1 mCi Tc-99m  Choletec IV COMPARISON:  None. FINDINGS: Prompt uptake and biliary excretion of activity by the liver is seen. Gallbladder activity is visualized, consistent with patency of cystic duct. Biliary activity passes into small bowel, consistent with patent common bile duct. IMPRESSION: Normal scan.  Patent cystic and common bile ducts. Electronically Signed   By: Jonathon  Watts M.D.   On: 12/05/2015 14:48   Us Renal  Result Date: 12/06/2015 CLINICAL DATA:  Acute renal injury EXAM: RENAL / URINARY TRACT ULTRASOUND COMPLETE COMPARISON:  02/09/2012 ultrasound of the kidneys FINDINGS: Right Kidney: Length: 10 cm. Echogenicity within normal limits. No mass or hydronephrosis visualized. There is  an anechoic simple appearing 1.2 x 1.2 x 1.4 cm lower pole renal cyst. Left Kidney: Length: 9.4 cm. Echogenicity within normal limits. No mass or hydronephrosis visualized. There is an exophytic well-circumscribed 5.6 x 3.8 x 5 cm interpolar anechoic simple cyst, previously measuring 5.1 x 4.1 x 3.7 cm. Adjacent to this is a 1 x 0.9 x 0.5 cm exophytic cyst too small to further characterize. Bladder: Appears normal for degree of bladder distention. IMPRESSION: No obstructive uropathy or significant loss of cortical -medullary distinction. Bilateral renal cysts as above described. Electronically Signed   By: David  Kwon M.D.   On: 12/06/2015 21:48   Dg Femur, Min 2 Views Right  Result Date: 12/06/2015 CLINICAL DATA:  Proximal right femoral tenderness for the past several days without known injury. EXAM: RIGHT FEMUR 2 VIEWS COMPARISON:  Right hip series of December 04, 2015 FINDINGS: The bones are subjectively adequately mineralized. There is mild narrowing of the hip joint space. The articular surfaces of the femoral head and acetabulum remains smoothly rounded. The observed portions of the right hemipelvis are normal. The femoral neck, intertrochanteric, and subtrochanteric regions of the femur are normal. There is no significant abnormality of the knee. The soft tissues of the thigh are unremarkable. There is vascular calcification in the superficial femoral artery. IMPRESSION: There is no acute or chronic bony abnormality of the right femur. Very mild osteoarthritic narrowing of the right hip joint space. Electronically Signed   By: David  Jordan M.D.   On: 12/06/2015 10:54          Scheduled Meds: . amLODipine  10 mg Oral Daily  . aspirin EC  81 mg Oral Daily  . atorvastatin  80 mg Oral q1800  . azithromycin  500 mg Intravenous Q24H  . cefTRIAXone (ROCEPHIN)  IV  1 g Intravenous Q24H  . enoxaparin (LOVENOX) injection  30 mg Subcutaneous Q24H  . guaiFENesin  600 mg Oral BID  . hydrALAZINE  50 mg  Oral Q8H  . isosorbide mononitrate  60 mg Oral Daily  . metoprolol tartrate  12.5 mg Oral BID  . sodium bicarbonate  650 mg Oral BID  . sodium chloride flush  3 mL Intravenous Q12H   Continuous Infusions: . sodium chloride 50 mL/hr (12/06/15 1006)     LOS: 3 days    Time spent: 35 min    ABROL,NAYANA, DO [facility] Pager [phone]  If 7PM-7AM, please contact night-coverage [email or web address] Password [email or web address] 12/07/2015, 11:57 AM   

## 2015-12-07 NOTE — Progress Notes (Signed)
Patient Name: Kevin [name] Date of Encounter: 12/07/2015 Primary Cardiologist: [facility] Problem List     Principal Problem:   Chest pain Active Problems:   CKD (chronic kidney disease) stage 4, GFR 15-29 ml/min (HCC)   Essential hypertension, benign   Peripheral arterial disease (HCC)   Anemia of chronic disease   CAD- S/P PCI 2014   Sepsis, unspecified organism ([place])   Elevated LFTs   Sepsis ([place])   Acute cholecystitis   Troponin level elevated   Acute on chronic renal insufficiency   Abdominal pain   AKI (acute kidney injury) ([place])     Subjective   No further chest pain.  He did not want the NTG paste.    Inpatient Medications    Scheduled Meds: . amLODipine  10 mg Oral Daily  . aspirin EC  81 mg Oral Daily  . atorvastatin  80 mg Oral q1800  . azithromycin  500 mg Intravenous Q24H  . cefTRIAXone (ROCEPHIN)  IV  1 g Intravenous Q24H  . enoxaparin (LOVENOX) injection  30 mg Subcutaneous Q24H  . hydrALAZINE  50 mg Oral Q8H  . isosorbide mononitrate  60 mg Oral Daily  . metoprolol tartrate  12.5 mg Oral BID  . sodium bicarbonate  650 mg Oral BID  . sodium chloride flush  3 mL Intravenous Q12H   Continuous Infusions: . sodium chloride 50 mL/hr (12/06/15 1006)   PRN Meds: acetaminophen **OR** acetaminophen, hydrALAZINE, HYDROcodone-acetaminophen, HYDROmorphone (DILAUDID) injection, nitroGLYCERIN, ondansetron **OR** ondansetron (ZOFRAN) IV, senna-docusate   Vital Signs    Vitals:   12/06/15 2358 12/07/15 0509 12/07/15 0515 12/07/15 0737  BP: 134/66 (!) 158/75 (!) 158/75 (!) 134/59  Pulse: 76  80 82  Resp: 19  (!) 22 20  Temp: 98.7 F (37.1 C)  97.9 F (36.6 C) 98 F (36.7 C)  TempSrc: Oral  Oral Oral  SpO2: 95%  97% 96%  Weight:   202 lb 9.6 oz (91.9 kg)   Height:        Intake/Output Summary (Last 24 hours) at 12/07/15 1039 Last data filed at 12/07/15 0825  Gross per 24 hour  Intake           2237.5 ml  Output              550 ml  Net            1687.5 ml   Filed Weights   12/05/15 0613 12/06/15 0358 12/07/15 0515  Weight: 202 lb 6.4 oz (91.8 kg) 202 lb 9.6 oz (91.9 kg) 202 lb 9.6 oz (91.9 kg)    Physical Exam    GEN: NAD.  Neck:  no JVD Cardiac:  Rate and Rhythm, nomurmurs, rubs, or gallops.  traceedema.  Radials/DP/PT 2+  and equal bilaterally.  Respiratory:  Respirations  regular and unlabored, clear to auscultation bilaterally. GI: Soft, nontender, nondistended, BS + x 4. Skin: warm and dry, no rash. Neuro:   Strength and sensation are intact. Psych:  AAOx3.  Normal affect.  Labs    CBC  Recent Labs  12/04/15 2323  12/06/15 0501 12/07/15 0426  WBC 10.1  < > 17.3* 15.6*  NEUTROABS 9.4*  --   --   --   HGB 10.9*  < > 9.8* 9.6*  HCT 33.5*  < > 31.3* 30.6*  MCV 94.4  < > 95.4 96.5  PLT 124*  < > 111* 111*  < > = values in this interval not displayed. Basic Metabolic  Panel  Recent Labs  12/06/15 0501 12/07/15 0426  NA 143 143  K 4.3 4.4  CL 118* 118*  CO2 19* 16*  GLUCOSE 75 88  BUN 28* 24*  CREATININE 2.34* 2.11*  CALCIUM 7.7* 8.1*   Liver Function Tests  Recent Labs  12/06/15 0501 12/07/15 0426  AST 272* 120*  ALT 394* 259*  ALKPHOS 86 87  BILITOT 0.8 0.6  PROT 5.3* 5.3*  ALBUMIN 2.6* 2.7*   No results for input(s): LIPASE, AMYLASE in the last 72 hours. Cardiac Enzymes  Recent Labs  12/04/15 2323 12/05/15 0239 12/05/15 0530  TROPONINI 0.28* 0.32* 0.34*   BNP Invalid input(s): POCBNP D-Dimer No results for input(s): DDIMER in the last 72 hours. Hemoglobin A1C No results for input(s): HGBA1C in the last 72 hours. Fasting Lipid Panel No results for input(s): CHOL, HDL, LDLCALC, TRIG, CHOLHDL, LDLDIRECT in the last 72 hours. Thyroid Function Tests No results for input(s): TSH, T4TOTAL, T3FREE, THYROIDAB in the last 72 hours.  Invalid input(s): FREET3  Telemetry    NSR,PVCs - Personally Reviewed  ECG    NA  Radiology    Dg Chest 2 View  Result Date:  12/06/2015 CLINICAL DATA:  Fever and shortness of breath today. History of coronary artery disease with stent placement. Former smoker. EXAM: CHEST  2 VIEW COMPARISON:  Portable chest x-ray of August 03, 2015 FINDINGS: The lungs are well-expanded. There is increased density at both lung bases. There is no pleural effusion or pneumothorax. The cardiac silhouette is mildly enlarged. The central pulmonary vascularity is prominent but stable. The mediastinum is normal in width. The bony thorax is unremarkable. IMPRESSION: Increased density at both lung bases consistent with atelectasis or developing pneumonia. Mild cardiomegaly without pulmonary vascular congestion. Followup PA and lateral chest X-ray is recommended in 3-4 weeks following trial of antibiotic therapy to ensure resolution and exclude underlying malignancy. Electronically Signed   By: David  Martinique M.D.   On: 12/06/2015 10:53   Nm Hepatobiliary Liver Func  Result Date: 12/05/2015 CLINICAL DATA:  Right upper quadrant abdominal pain. Symptoms for 2 days EXAM: NUCLEAR MEDICINE HEPATOBILIARY IMAGING TECHNIQUE: Sequential images of the abdomen were obtained out to 60 minutes following intravenous administration of radiopharmaceutical. RADIOPHARMACEUTICALS:  5.1 mCi Tc-19m  Choletec IV COMPARISON:  None. FINDINGS: Prompt uptake and biliary excretion of activity by the liver is seen. Gallbladder activity is visualized, consistent with patency of cystic duct. Biliary activity passes into small bowel, consistent with patent common bile duct. IMPRESSION: Normal scan.  Patent cystic and common bile ducts. Electronically Signed   By: Monte Fantasia M.D.   On: 12/05/2015 14:48   US Renal  Result Date: 12/06/2015 CLINICAL DATA:  Acute renal injury EXAM: RENAL / URINARY TRACT ULTRASOUND COMPLETE COMPARISON:  02/09/2012 ultrasound of the kidneys FINDINGS: Right Kidney: Length: 10 cm. Echogenicity within normal limits. No mass or hydronephrosis visualized. There is  an anechoic simple appearing 1.2 x 1.2 x 1.4 cm lower pole renal cyst. Left Kidney: Length: 9.4 cm. Echogenicity within normal limits. No mass or hydronephrosis visualized. There is an exophytic well-circumscribed 5.6 x 3.8 x 5 cm interpolar anechoic simple cyst, previously measuring 5.1 x 4.1 x 3.7 cm. Adjacent to this is a 1 x 0.9 x 0.5 cm exophytic cyst too small to further characterize. Bladder: Appears normal for degree of bladder distention. IMPRESSION: No obstructive uropathy or significant loss of cortical -medullary distinction. Bilateral renal cysts as above described. Electronically Signed   By: Ashley Royalty  M.D.   On: 12/06/2015 21:48   Dg Femur, Min 2 Views Right  Result Date: 12/06/2015 CLINICAL DATA:  Proximal right femoral tenderness for the past several days without known injury. EXAM: RIGHT FEMUR 2 VIEWS COMPARISON:  Right hip series of December 04, 2015 FINDINGS: The bones are subjectively adequately mineralized. There is mild narrowing of the hip joint space. The articular surfaces of the femoral head and acetabulum remains smoothly rounded. The observed portions of the right hemipelvis are normal. The femoral neck, intertrochanteric, and subtrochanteric regions of the femur are normal. There is no significant abnormality of the knee. The soft tissues of the thigh are unremarkable. There is vascular calcification in the superficial femoral artery. IMPRESSION: There is no acute or chronic bony abnormality of the right femur. Very mild osteoarthritic narrowing of the right hip joint space. Electronically Signed   By: David  Martinique M.D.   On: 12/06/2015 10:54    Cardiac Studies   ------------------------------------------------------------------- Echo   - Left ventricle: The cavity size was normal. There was moderate   focal basal and mild concentric hypertrophy. Systolic function   was normal. The estimated ejection fraction was in the range of   55% to 60%. Wall motion was normal;  there were no regional wall   motion abnormalities. Left ventricular diastolic function   parameters were normal. - Aortic valve: Trileaflet; normal thickness, moderately calcified   leaflets. - Mitral valve: There was trivial regurgitation. - Left atrium: The atrium was moderately dilated. - Pulmonary arteries: PA peak pressure: 47 mm Hg (S).   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 He had an LAD PCI with DES in Jan 2014. At that time he had CTO of the RCA and moderate dCFX disease. He was re studied in Sept 2016 and the LAD site was patent. There was no change in his residual CAD. Echo then showed normal LVF. His LOV was July 2017 and he was stable from a cardiac standpoint.  He presented with chest pain.  He did have troponin elevation but is found to have sepsis.    Assessment & Plan    CAD:  Elevated troponin.    Resumed Lipitor.   Resumed Imdur.   His enzyme trend was flat.  I suspect some demand ischemia related to his other acute illnesses.   Echo without wall motion abnormalities.  There was some question of his pulmonary pressures being elevated.  However, I don't see that this was adequately evaluated on these images or that it was noted previously.  I don't know that this represents a real change from previous.  It should be followed up with repeat echos in the future.   CKD Class IV:     Creat better.  Holding ACE inhibitor.  Further work up per primary team.   HTN:   Restarted Imdur.  I also restarted hydralazine at a slightly lower dose than previous.  BPs are better.  No change in therapy.   Sepsis:  Gall bladder is not likely the etiology per general surgery.  Plan per primary team.    Signed, Minus Breeding, MD  12/07/2015, 10:39 AM

## 2015-12-07 NOTE — Care Management Important Message (Signed)
Important Message  Patient Details  Name: Kevin [name] MRN: [id number] Date of Birth: 11-11-1930   Medicare Important Message Given:  Yes    [name] 12/07/2015, 10:59 AM

## 2015-12-08 DIAGNOSIS — N289 Disorder of kidney and ureter, unspecified: Secondary | ICD-10-CM

## 2015-12-08 DIAGNOSIS — N189 Chronic kidney disease, unspecified: Secondary | ICD-10-CM

## 2015-12-08 DIAGNOSIS — R079 Chest pain, unspecified: Secondary | ICD-10-CM

## 2015-12-08 DIAGNOSIS — I200 Unstable angina: Secondary | ICD-10-CM

## 2015-12-08 DIAGNOSIS — R1013 Epigastric pain: Secondary | ICD-10-CM

## 2015-12-08 LAB — COMPREHENSIVE METABOLIC PANEL
ALT: 189 U/L — ABNORMAL HIGH (ref 17–63)
ANION GAP: 9 (ref 5–15)
AST: 73 U/L — AB (ref 15–41)
Albumin: 2.8 g/dL — ABNORMAL LOW (ref 3.5–5.0)
Alkaline Phosphatase: 127 U/L — ABNORMAL HIGH (ref 38–126)
BILIRUBIN TOTAL: 0.8 mg/dL (ref 0.3–1.2)
BUN: 21 mg/dL — AB (ref 6–20)
CHLORIDE: 115 mmol/L — AB (ref 101–111)
CO2: 20 mmol/L — ABNORMAL LOW (ref 22–32)
Calcium: 8.5 mg/dL — ABNORMAL LOW (ref 8.9–10.3)
Creatinine, Ser: 1.9 mg/dL — ABNORMAL HIGH (ref 0.61–1.24)
GFR, EST AFRICAN AMERICAN: 36 mL/min — AB (ref >60)
GFR, EST NON AFRICAN AMERICAN: 31 mL/min — AB (ref >60)
Glucose, Bld: 102 mg/dL — ABNORMAL HIGH (ref 65–99)
POTASSIUM: 4.5 mmol/L (ref 3.5–5.1)
Sodium: 144 mmol/L (ref 135–145)
TOTAL PROTEIN: 5.2 g/dL — AB (ref 6.5–8.1)

## 2015-12-08 LAB — PROCALCITONIN: PROCALCITONIN: 93.47 ng/mL

## 2015-12-08 MED ORDER — CEFDINIR 300 MG PO CAPS: 300.0000 mg | ORAL_CAPSULE | Freq: Two times a day (BID) | ORAL | 0 refills | Status: DC

## 2015-12-08 MED ORDER — GUAIFENESIN ER 600 MG PO TB12: 600.0000 mg | ORAL_TABLET | Freq: Two times a day (BID) | ORAL | 0 refills | Status: AC

## 2015-12-08 MED ORDER — AZITHROMYCIN 500 MG PO TABS: 500.0000 mg | ORAL_TABLET | Freq: Every day | ORAL | 0 refills | Status: DC

## 2015-12-08 NOTE — Progress Notes (Signed)
Patient Name: Kevin [name] Date of Encounter: 12/08/2015 Primary Cardiologist: [facility] Problem List     Principal Problem:   Chest pain Active Problems:   CKD (chronic kidney disease) stage 4, GFR 15-29 ml/min (HCC)   Essential hypertension, benign   Peripheral arterial disease (HCC)   Anemia of chronic disease   CAD- S/P PCI 2014   Sepsis, unspecified organism ([place])   Elevated LFTs   Sepsis ([place])   Acute cholecystitis   Troponin level elevated   Acute on chronic renal insufficiency   Abdominal pain   AKI (acute kidney injury) ([place])     Subjective   No complaints this am no chest pain   Inpatient Medications    Scheduled Meds: . amLODipine  10 mg Oral Daily  . aspirin EC  81 mg Oral Daily  . atorvastatin  80 mg Oral q1800  . azithromycin  500 mg Intravenous Q24H  . cefTRIAXone (ROCEPHIN)  IV  1 g Intravenous Q24H  . enoxaparin (LOVENOX) injection  30 mg Subcutaneous Q24H  . guaiFENesin  600 mg Oral BID  . hydrALAZINE  50 mg Oral Q8H  . isosorbide mononitrate  60 mg Oral Daily  . metoprolol tartrate  12.5 mg Oral BID  . sodium bicarbonate  650 mg Oral BID  . sodium chloride flush  3 mL Intravenous Q12H   Continuous Infusions: . sodium chloride 75 mL/hr at 12/08/15 0601   PRN Meds: acetaminophen **OR** acetaminophen, hydrALAZINE, HYDROcodone-acetaminophen, HYDROmorphone (DILAUDID) injection, nitroGLYCERIN, ondansetron **OR** ondansetron (ZOFRAN) IV, senna-docusate   Vital Signs    Vitals:   12/07/15 1656 12/07/15 2100 12/08/15 0559 12/08/15 0900  BP: 124/61 137/72 (!) 183/79 (!) 156/76  Pulse: 76 82  82  Resp: (!) 21 (!) 26 (!) 27 (!) 24  Temp: 98.8 F (37.1 C) 98.4 F (36.9 C) 98.4 F (36.9 C) 98.2 F (36.8 C)  TempSrc: Oral Oral Oral Oral  SpO2: 98% 99% 99% 98%  Weight:   92.7 kg (204 lb 6.4 oz)   Height:        Intake/Output Summary (Last 24 hours) at 12/08/15 0928 Last data filed at 12/08/15 6384  Gross per 24 hour  Intake              1735 ml  Output              200 ml  Net             1535 ml   Filed Weights   12/06/15 0358 12/07/15 0515 12/08/15 0559  Weight: 91.9 kg (202 lb 9.6 oz) 91.9 kg (202 lb 9.6 oz) 92.7 kg (204 lb 6.4 oz)    Physical Exam    GEN: NAD.  Neck:  no JVD Cardiac:  Rate and Rhythm, nomurmurs, rubs, or gallops.  traceedema.  Radials/DP/PT 2+  and equal bilaterally.  Respiratory:  Respirations  regular and unlabored, clear to auscultation bilaterally. GI: Soft, nontender, nondistended, BS + x 4. Skin: warm and dry, no rash. Neuro:   Strength and sensation are intact. Psych:  AAOx3.  Normal affect.  Labs    CBC  Recent Labs  12/06/15 0501 12/07/15 0426  WBC 17.3* 15.6*  HGB 9.8* 9.6*  HCT 31.3* 30.6*  MCV 95.4 96.5  PLT 111* 665*   Basic Metabolic Panel  Recent Labs  12/07/15 0426 12/08/15 0353  NA 143 144  K 4.4 4.5  CL 118* 115*  CO2 16* 20*  GLUCOSE 88 102*  BUN  24* 21*  CREATININE 2.11* 1.90*  CALCIUM 8.1* 8.5*   Liver Function Tests  Recent Labs  12/07/15 0426 12/08/15 0353  AST 120* 73*  ALT 259* 189*  ALKPHOS 87 127*  BILITOT 0.6 0.8  PROT 5.3* 5.2*  ALBUMIN 2.7* 2.8*     Telemetry    NSR,PVCs - Personally Reviewed  ECG    NA  Radiology    Dg Chest 2 View  Result Date: 12/06/2015 CLINICAL DATA:  Fever and shortness of breath today. History of coronary artery disease with stent placement. Former smoker. EXAM: CHEST  2 VIEW COMPARISON:  Portable chest x-ray of August 03, 2015 FINDINGS: The lungs are well-expanded. There is increased density at both lung bases. There is no pleural effusion or pneumothorax. The cardiac silhouette is mildly enlarged. The central pulmonary vascularity is prominent but stable. The mediastinum is normal in width. The bony thorax is unremarkable. IMPRESSION: Increased density at both lung bases consistent with atelectasis or developing pneumonia. Mild cardiomegaly without pulmonary vascular congestion. Followup PA and  lateral chest X-ray is recommended in 3-4 weeks following trial of antibiotic therapy to ensure resolution and exclude underlying malignancy. Electronically Signed   By: David  Martinique M.D.   On: 12/06/2015 10:53   US Renal  Result Date: 12/06/2015 CLINICAL DATA:  Acute renal injury EXAM: RENAL / URINARY TRACT ULTRASOUND COMPLETE COMPARISON:  02/09/2012 ultrasound of the kidneys FINDINGS: Right Kidney: Length: 10 cm. Echogenicity within normal limits. No mass or hydronephrosis visualized. There is an anechoic simple appearing 1.2 x 1.2 x 1.4 cm lower pole renal cyst. Left Kidney: Length: 9.4 cm. Echogenicity within normal limits. No mass or hydronephrosis visualized. There is an exophytic well-circumscribed 5.6 x 3.8 x 5 cm interpolar anechoic simple cyst, previously measuring 5.1 x 4.1 x 3.7 cm. Adjacent to this is a 1 x 0.9 x 0.5 cm exophytic cyst too small to further characterize. Bladder: Appears normal for degree of bladder distention. IMPRESSION: No obstructive uropathy or significant loss of cortical -medullary distinction. Bilateral renal cysts as above described. Electronically Signed   By: Ashley Royalty M.D.   On: 12/06/2015 21:48   Dg Femur, Min 2 Views Right  Result Date: 12/06/2015 CLINICAL DATA:  Proximal right femoral tenderness for the past several days without known injury. EXAM: RIGHT FEMUR 2 VIEWS COMPARISON:  Right hip series of December 04, 2015 FINDINGS: The bones are subjectively adequately mineralized. There is mild narrowing of the hip joint space. The articular surfaces of the femoral head and acetabulum remains smoothly rounded. The observed portions of the right hemipelvis are normal. The femoral neck, intertrochanteric, and subtrochanteric regions of the femur are normal. There is no significant abnormality of the knee. The soft tissues of the thigh are unremarkable. There is vascular calcification in the superficial femoral artery. IMPRESSION: There is no acute or chronic bony  abnormality of the right femur. Very mild osteoarthritic narrowing of the right hip joint space. Electronically Signed   By: David  Martinique M.D.   On: 12/06/2015 10:54    Cardiac Studies   ------------------------------------------------------------------- Echo   - Left ventricle: The cavity size was normal. There was moderate   focal basal and mild concentric hypertrophy. Systolic function   was normal. The estimated ejection fraction was in the range of   55% to 60%. Wall motion was normal; there were no regional wall   motion abnormalities. Left ventricular diastolic function   parameters were normal. - Aortic valve: Trileaflet; normal thickness, moderately calcified  leaflets. - Mitral valve: There was trivial regurgitation. - Left atrium: The atrium was moderately dilated. - Pulmonary arteries: PA peak pressure: 47 mm Hg (S).   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 He had an LAD PCI with DES in Jan 2014. At that time he had CTO of the RCA and moderate dCFX disease. He was re studied in Sept 2016 and the LAD site was patent. There was no change in his residual CAD. Echo then showed normal LVF. His LOV was July 2017 and he was stable from a cardiac standpoint.  He presented with chest pain.  He did have troponin elevation but is found to have sepsis.    Assessment & Plan    CAD:  Elevated troponin.    Resumed Lipitor.   Resumed Imdur.   His enzyme trend was flat.  I suspect some demand ischemia related to his other acute illnesses.   Echo without wall motion abnormalities. No mention of further w/u per [place] stable by cath 2016 Suspect he will benefit from outpatient risk stratification with myovue   CKD Class IV:     Creat better.  Holding ACE inhibitor.  Further work up per primary team. Cr 1.9 this am   HTN:   Restarted Imdur.  I also restarted hydralazine at a slightly lower dose than  previous.  BPs are better.  No change in therapy.   Sepsis:  Gall bladder is not likely the etiology per general surgery.  Plan per primary team.    Signed, Jenkins Rouge, MD  12/08/2015, 9:28 AM   Patient ID: Gerri Spore Spacek, male   DOB: 06/26/1930, 82 y.o.   MRN: [id number]

## 2015-12-08 NOTE — Discharge Summary (Addendum)
Physician Discharge Summary  Kevin [name] MRN: [id number] DOB/AGE: [birth year] 80 y.o.  PCP: Glenda Chroman, MD   Admit date: 12/04/2015 Discharge date: 12/08/2015  Discharge Diagnoses:    Principal Problem:   Chest pain Active Problems:   CKD (chronic kidney disease) stage 4, GFR 15-29 ml/min (HCC)   Essential hypertension, benign   Peripheral arterial disease (HCC)   Anemia of chronic disease   CAD- S/P PCI 2014   Sepsis, unspecified organism ([place])   Elevated LFTs   Sepsis ([place])   Acute cholecystitis   Troponin level elevated   Acute on chronic renal insufficiency   Abdominal pain   AKI (acute kidney injury) ([place])    Follow-up recommendations Follow-up with PCP in 3-5 days , including all  additional recommended appointments as below Follow-up CBC, CMP in 3-5 days  Patient would benefit from outpatient stress test/Myoview  Continue to hold  ACE inhibitor pending improvement of renal function      Current Discharge Medication List    START taking these medications   Details  azithromycin (ZITHROMAX) 500 MG tablet Take 1 tablet (500 mg total) by mouth daily. Qty: 5 tablet, Refills: 0    cefdinir (OMNICEF) 300 MG capsule Take 1 capsule (300 mg total) by mouth 2 (two) times daily. Qty: 10 capsule, Refills: 0    guaiFENesin (MUCINEX) 600 MG 12 hr tablet Take 1 tablet (600 mg total) by mouth 2 (two) times daily. Qty: 20 tablet, Refills: 0      CONTINUE these medications which have NOT CHANGED   Details  amLODipine (NORVASC) 10 MG tablet Take 10 mg by mouth daily.    aspirin EC 81 MG tablet Take 81 mg by mouth daily.    atorvastatin (LIPITOR) 80 MG tablet TAKE ONE TABLET BY MOUTH AT BEDTIME Qty: 30 tablet, Refills: 3    Cholecalciferol (VITAMIN D-3) 1000 UNITS CAPS Take 1,000 Units by mouth daily.     hydrALAZINE (APRESOLINE) 100 MG tablet TAKE ONE TABLET BY MOUTH THREE TIMES DAILY. Qty: 90 tablet, Refills: 5    isosorbide mononitrate (IMDUR) 60 MG 24 hr  tablet TAKE ONE TABLET BY MOUTH DAILY. Qty: 30 tablet, Refills: 6    metoprolol tartrate (LOPRESSOR) 25 MG tablet Take 0.5 tablets (12.5 mg total) by mouth 2 (two) times daily. Qty: 90 tablet, Refills: 3    nitroGLYCERIN (NITROSTAT) 0.4 MG SL tablet Place 1 tablet (0.4 mg total) under the tongue every 5 (five) minutes as needed for chest pain (up to 3 doses). Qty: 25 tablet, Refills: 4   Associated Diagnoses: Coronary artery disease involving native coronary artery of native heart with angina pectoris (HCC)    Polysaccharide Iron Complex (POLY-IRON 150 PO) Take 150 mg by mouth daily.    sodium bicarbonate 650 MG tablet Take 1 tablet (650 mg total) by mouth 2 (two) times daily. Qty: 60 tablet, Refills: 0      STOP taking these medications     lisinopril (PRINIVIL,ZESTRIL) 5 MG tablet          Discharge Condition:Stable    Discharge Instructions Get Medicines reviewed and adjusted: Please take all your medications with you for your next visit with your Primary MD  Please request your Primary MD to go over all hospital tests and procedure/radiological results at the follow up, please ask your Primary MD to get all Hospital records sent to his/her office.  If you experience worsening of your admission symptoms, develop shortness of breath, life threatening emergency, suicidal or  homicidal thoughts you must seek medical attention immediately by calling 911 or calling your MD immediately if symptoms less severe.  You must read complete instructions/literature along with all the possible adverse reactions/side effects for all the Medicines you take and that have been prescribed to you. Take any new Medicines after you have completely understood and accpet all the possible adverse reactions/side effects.   Do not drive when taking Pain medications.   Do not take more than prescribed Pain, Sleep and Anxiety Medications  Special Instructions: If you have smoked or chewed Tobacco in  the last 2 yrs please stop smoking, stop any regular Alcohol and or any Recreational drug use.  Wear Seat belts while driving.  Please note  You were cared for by a hospitalist during your hospital stay. Once you are discharged, your primary care physician will handle any further medical issues. Please note that NO REFILLS for any discharge medications will be authorized once you are discharged, as it is imperative that you return to your primary care physician (or establish a relationship with a primary care physician if you do not have one) for your aftercare needs so that they can reassess your need for medications and monitor your lab values.     No Known Allergies    Disposition: 01-Home or Self Care   Consults:  Cardiology     Significant Diagnostic Studies:  Dg Chest 2 View  Result Date: 12/06/2015 CLINICAL DATA:  Fever and shortness of breath today. History of coronary artery disease with stent placement. Former smoker. EXAM: CHEST  2 VIEW COMPARISON:  Portable chest x-ray of August 03, 2015 FINDINGS: The lungs are well-expanded. There is increased density at both lung bases. There is no pleural effusion or pneumothorax. The cardiac silhouette is mildly enlarged. The central pulmonary vascularity is prominent but stable. The mediastinum is normal in width. The bony thorax is unremarkable. IMPRESSION: Increased density at both lung bases consistent with atelectasis or developing pneumonia. Mild cardiomegaly without pulmonary vascular congestion. Followup PA and lateral chest X-ray is recommended in 3-4 weeks following trial of antibiotic therapy to ensure resolution and exclude underlying malignancy. Electronically Signed   By: David  Martinique M.D.   On: 12/06/2015 10:53   Nm Hepatobiliary Liver Func  Result Date: 12/05/2015 CLINICAL DATA:  Right upper quadrant abdominal pain. Symptoms for 2 days EXAM: NUCLEAR MEDICINE HEPATOBILIARY IMAGING TECHNIQUE: Sequential images of the  abdomen were obtained out to 60 minutes following intravenous administration of radiopharmaceutical. RADIOPHARMACEUTICALS:  5.1 mCi Tc-90m Choletec IV COMPARISON:  None. FINDINGS: Prompt uptake and biliary excretion of activity by the liver is seen. Gallbladder activity is visualized, consistent with patency of cystic duct. Biliary activity passes into small bowel, consistent with patent common bile duct. IMPRESSION: Normal scan.  Patent cystic and common bile ducts. Electronically Signed   By: JMonte FantasiaM.D.   On: 12/05/2015 14:48   [place]al  Result Date: 12/06/2015 CLINICAL DATA:  Acute renal injury EXAM: RENAL / URINARY TRACT ULTRASOUND COMPLETE COMPARISON:  02/09/2012 ultrasound of the kidneys FINDINGS: Right Kidney: Length: 10 cm. Echogenicity within normal limits. No mass or hydronephrosis visualized. There is an anechoic simple appearing 1.2 x 1.2 x 1.4 cm lower pole renal cyst. Left Kidney: Length: 9.4 cm. Echogenicity within normal limits. No mass or hydronephrosis visualized. There is an exophytic well-circumscribed 5.6 x 3.8 x 5 cm interpolar anechoic simple cyst, previously measuring 5.1 x 4.1 x 3.7 cm. Adjacent to this is a 1 x 0.9  x 0.5 cm exophytic cyst too small to further characterize. Bladder: Appears normal for degree of bladder distention. IMPRESSION: No obstructive uropathy or significant loss of cortical -medullary distinction. Bilateral renal cysts as above described. Electronically Signed   By: Ashley Royalty M.D.   On: 12/06/2015 21:48   US Abdomen Limited  Result Date: 12/05/2015 CLINICAL DATA:  Elevated liver function studies. Right upper quadrant pain for 2 days. History of hypertension and chronic kidney disease. EXAM: US ABDOMEN LIMITED - RIGHT UPPER QUADRANT COMPARISON:  None. FINDINGS: Gallbladder: Cholelithiasis with several stones in the gallbladder and extending to the gallbladder neck. Largest stone measures about 1 cm diameter. Mild gallbladder wall thickening at 3.8  mm maximum. Mild pericholecystic edema. Murphy's sign is negative. Common bile duct: Diameter: 3.5 mm, normal Liver: Mild diffusely increased liver parenchymal echotexture suggesting fatty infiltration. No focal lesions identified. IMPRESSION: Cholelithiasis with mild gallbladder wall thickening and edema. Although Murphy's sign is negative, changes may indicate acute cholecystitis in the appropriate clinical setting. No bile duct dilatation. Electronically Signed   By: Lucienne Capers M.D.   On: 12/05/2015 00:19   Dg Femur, Min 2 Views Right  Result Date: 12/06/2015 CLINICAL DATA:  Proximal right femoral tenderness for the past several days without known injury. EXAM: RIGHT FEMUR 2 VIEWS COMPARISON:  Right hip series of December 04, 2015 FINDINGS: The bones are subjectively adequately mineralized. There is mild narrowing of the hip joint space. The articular surfaces of the femoral head and acetabulum remains smoothly rounded. The observed portions of the right hemipelvis are normal. The femoral neck, intertrochanteric, and subtrochanteric regions of the femur are normal. There is no significant abnormality of the knee. The soft tissues of the thigh are unremarkable. There is vascular calcification in the superficial femoral artery. IMPRESSION: There is no acute or chronic bony abnormality of the right femur. Very mild osteoarthritic narrowing of the right hip joint space. Electronically Signed   By: David  Martinique M.D.   On: 12/06/2015 10:54        Filed Weights   12/06/15 0358 12/07/15 0515 12/08/15 0559  Weight: 91.9 kg (202 lb 9.6 oz) 91.9 kg (202 lb 9.6 oz) 92.7 kg (204 lb 6.4 oz)     Microbiology: Recent Results (from the past 240 hour(s))  Respiratory Panel by PCR     Status: None   Collection Time: 12/04/15 10:09 PM  Result Value Ref Range Status   Adenovirus NOT DETECTED NOT DETECTED Final   Coronavirus 229E NOT DETECTED NOT DETECTED Final   Coronavirus HKU1 NOT DETECTED NOT DETECTED  Final   Coronavirus NL63 NOT DETECTED NOT DETECTED Final   Coronavirus OC43 NOT DETECTED NOT DETECTED Final   Metapneumovirus NOT DETECTED NOT DETECTED Final   Rhinovirus / Enterovirus NOT DETECTED NOT DETECTED Final   Influenza A NOT DETECTED NOT DETECTED Final   Influenza B NOT DETECTED NOT DETECTED Final   Parainfluenza Virus 1 NOT DETECTED NOT DETECTED Final   Parainfluenza Virus 2 NOT DETECTED NOT DETECTED Final   Parainfluenza Virus 3 NOT DETECTED NOT DETECTED Final   Parainfluenza Virus 4 NOT DETECTED NOT DETECTED Final   Respiratory Syncytial Virus NOT DETECTED NOT DETECTED Final   Bordetella pertussis NOT DETECTED NOT DETECTED Final   Chlamydophila pneumoniae NOT DETECTED NOT DETECTED Final   Mycoplasma pneumoniae NOT DETECTED NOT DETECTED Final  Culture, blood (x 2)     Status: None (Preliminary result)   Collection Time: 12/04/15 11:23 PM  Result Value Ref Range Status  Specimen Description BLOOD LEFT ANTECUBITAL  Final   Special Requests BOTTLES DRAWN AEROBIC ONLY 8CC  Final   Culture NO GROWTH 4 DAYS  Final   Report Status PENDING  Incomplete  Culture, blood (x 2)     Status: None (Preliminary result)   Collection Time: 12/04/15 11:29 PM  Result Value Ref Range Status   Specimen Description BLOOD LEFT HAND  Final   Special Requests BOTTLES DRAWN AEROBIC AND ANAEROBIC 5CC EA  Final   Culture NO GROWTH 4 DAYS  Final   Report Status PENDING  Incomplete  Urine culture     Status: None   Collection Time: 12/05/15  9:30 AM  Result Value Ref Range Status   Specimen Description URINE, RANDOM  Final   Special Requests NONE  Final   Culture NO GROWTH  Final   Report Status 12/06/2015 FINAL  Final       Blood Culture    Component Value Date/Time   SDES URINE, RANDOM 12/05/2015 0930   SPECREQUEST NONE 12/05/2015 0930   CULT NO GROWTH 12/05/2015 0930   REPTSTATUS 12/06/2015 FINAL 12/05/2015 0930      Labs: Results for orders placed or performed during the hospital  encounter of 12/04/15 (from the past 48 hour(s))  CBC     Status: Abnormal   Collection Time: 12/07/15  4:26 AM  Result Value Ref Range   WBC 15.6 (H) 4.0 - 10.5 K/uL   RBC 3.17 (L) 4.22 - 5.81 MIL/uL   Hemoglobin 9.6 (L) 13.0 - 17.0 g/dL   HCT 30.6 (L) 39.0 - 52.0 %   MCV 96.5 78.0 - 100.0 fL   MCH 30.3 26.0 - 34.0 pg   MCHC 31.4 30.0 - 36.0 g/dL   RDW 14.9 11.5 - 15.5 %   Platelets 111 (L) 150 - 400 K/uL    Comment: CONSISTENT WITH PREVIOUS RESULT  Comprehensive metabolic panel     Status: Abnormal   Collection Time: 12/07/15  4:26 AM  Result Value Ref Range   Sodium 143 135 - 145 mmol/L   Potassium 4.4 3.5 - 5.1 mmol/L   Chloride 118 (H) 101 - 111 mmol/L   CO2 16 (L) 22 - 32 mmol/L   Glucose, Bld 88 65 - 99 mg/dL   BUN 24 (H) 6 - 20 mg/dL   Creatinine, Ser 2.11 (H) 0.61 - 1.24 mg/dL   Calcium 8.1 (L) 8.9 - 10.3 mg/dL   Total Protein 5.3 (L) 6.5 - 8.1 g/dL   Albumin 2.7 (L) 3.5 - 5.0 g/dL   AST 120 (H) 15 - 41 U/L   ALT 259 (H) 17 - 63 U/L   Alkaline Phosphatase 87 38 - 126 U/L   Total Bilirubin 0.6 0.3 - 1.2 mg/dL   GFR calc non Af Amer 27 (L) >60 mL/min   GFR calc Af Amer 32 (L) >60 mL/min    Comment: (NOTE) The eGFR has been calculated using the CKD EPI equation. This calculation has not been validated in all clinical situations. eGFR's persistently <60 mL/min signify possible Chronic Kidney Disease.    Anion gap 9 5 - 15  Procalcitonin     Status: None   Collection Time: 12/08/15  3:53 AM  Result Value Ref Range   Procalcitonin 93.47 ng/mL    Comment:        Interpretation: PCT >= 10 ng/mL: Important systemic inflammatory response, almost exclusively due to severe bacterial sepsis or septic shock. (NOTE)  ICU PCT Algorithm               Non ICU PCT Algorithm    ----------------------------     ------------------------------         PCT < 0.25 ng/mL                 PCT < 0.1 ng/mL     Stopping of antibiotics            Stopping of antibiotics        strongly encouraged.               strongly encouraged.    ----------------------------     ------------------------------       PCT level decrease by               PCT < 0.25 ng/mL       >= 80% from peak PCT       OR PCT 0.25 - 0.5 ng/mL          Stopping of antibiotics                                             encouraged.     Stopping of antibiotics           encouraged.    ----------------------------     ------------------------------       PCT level decrease by              PCT >= 0.25 ng/mL       < 80% from peak PCT        AND PCT >= 0.5 ng/mL             Continuing antibiotics                                              encouraged.       Continuing antibiotics            encouraged.    ----------------------------     ------------------------------     PCT level increase compared          PCT > 0.5 ng/mL         with peak PCT AND          PCT >= 0.5 ng/mL             Escalation of antibiotics                                          strongly encouraged.      Escalation of antibiotics        strongly encouraged.   Comprehensive metabolic panel     Status: Abnormal   Collection Time: 12/08/15  3:53 AM  Result Value Ref Range   Sodium 144 135 - 145 mmol/L   Potassium 4.5 3.5 - 5.1 mmol/L   Chloride 115 (H) 101 - 111 mmol/L   CO2 20 (L) 22 - 32 mmol/L   Glucose, Bld 102 (H) 65 - 99 mg/dL   BUN 21 (H) 6 - 20 mg/dL   Creatinine, Ser 1.90 (H) 0.61 - 1.24 mg/dL  Calcium 8.5 (L) 8.9 - 10.3 mg/dL   Total Protein 5.2 (L) 6.5 - 8.1 g/dL   Albumin 2.8 (L) 3.5 - 5.0 g/dL   AST 73 (H) 15 - 41 U/L   ALT 189 (H) 17 - 63 U/L   Alkaline Phosphatase 127 (H) 38 - 126 U/L   Total Bilirubin 0.8 0.3 - 1.2 mg/dL   GFR calc non Af Amer 31 (L) >60 mL/min   GFR calc Af Amer 36 (L) >60 mL/min    Comment: (NOTE) The eGFR has been calculated using the CKD EPI equation. This calculation has not been validated in all clinical situations. eGFR's persistently <60 mL/min signify possible Chronic  Kidney Disease.    Anion gap 9 5 - 15     Lipid Panel     Component Value Date/Time   CHOL 156 10/07/2014 0422   TRIG 56 10/07/2014 0422   HDL 38 (L) 10/07/2014 0422   CHOLHDL 4.1 10/07/2014 0422   VLDL 11 10/07/2014 0422   LDLCALC 107 (H) 10/07/2014 0422        HPI :   40 old male transferred from [facility] with chest pain. He has a history of coronary disease with DES to the LAD 2014. Chronic Kidney disease baseline reported at 1.5-1.6. Patient reported onset of pain yesterday around 4 PM watching TV. He describes central chest discomfort radiating to the back of his neck to severe in intensity with shortness of breath. He reported it felt like someone was sitting on his chest. He was treated with oxygen and nitroglycerin by EMS. Workup in the ED there showed he was febrile with a temperature of 101.2, heart rate in the 130s BP 138/73. Creatinine was 1.8 AST and ALT were elevated at 420/216 respectively. EKG showed frequent ectopy but no ST changes. At this point he was transferred to [facility] for further treatment.  Evaluation here at [facility] shows he is afebrile and his heart rate is in the 70-80 range currently BP is stable at 100/60 range. Creatinine is elevated this a.m. to 2.37. AST on admission was 1432  , ALT  was 840 . Troponins are 0.28, 0.32 and 0.34 respectively, lactate was as high as 3.0.   Abdominal ultrasound on admission shows cholelithiasis with several stones in the gallbladder and extending to the gallbladder neck the largest stone is 1 cm in diameter. Mild gallbladder wall thickening at 3.8 mm maximum mild pericholecystic edema. Common bile duct is 3.5 mm and normal. Treatment so far is included azithromycin 500 mg by mouth, and Rocephin 1 g IV. Dr. Eliseo Squires has discontinued this and started him on Zosyn. General surgery requested to see patient for possible cholecystitis.    HOSPITAL COURSE: *   Sepsis  likely secondary to community  acquired pneumonia Chest x-ray shows bibasilar pneumonia. Initiated on antibiotics 11/7 Gallbladder does not appear to be source             -HIDA normal - blood and urine cultures NGTD -lactic acidelevated -negative Resp virus panel - procalcitonin  significantly elevated on admission, now 93.47. Will continue antibiotics for another 5 days Continue treatment for pneumonia -renal U/S as AKI -no obstructive uropathy -hip pain with out effusion/cellulitis-- x ray done     Right upper quadrant pain AST/ALT elevation, alk phos and bilirubin normal - RUQ u/s shows cholelithiasis - Hepatitis panel 11/7 negative - blood cultures negative - urine culture negative - HIDA negative - lactic acid was 3.0, now  1.7 -viral hepatitis negative -Trend CMP, improving Surgery has signed off  Chest pain: -resovled, suspect demand ischemia, Echo without wall motion abnormalities Patient would need a follow-up repeat echo to evaluate pulmonary pressure Patient would benefit from outpatient stress test/Myoview   HTN and CAD secondary prevention: -hold statin for elevated LFTs -Hold lisinopril for  AKI -resume norvasc/BB and imdur  Anemia: monitor, patient would benefit from outpatient hematology referral for his anemia Patient had KAPPA  light chains on urine IFE   AKI on CKD III-IV: Baseline 1.6-1.8.  -Avoid nephrotoxins, hold ACE inhibitor -Continue oral bicarb  Discharge Exam:  Blood pressure (!) 156/76, pulse 82, temperature 98.2 F (36.8 C), temperature source Oral, resp. rate (!) 24, height 5' 6"  (1.676 m), weight 92.7 kg (204 lb 6.4 oz), SpO2 98 %.      Follow-up Information    Glenda Chroman, MD. Call in 1 day(s).   Specialty:  Internal Medicine Why:  call  PCP to make appointment, follow-up with PCP in 3-5 days Contact information: [place] [id number] [phone]           Signed: Reyne Dumas 12/08/2015, 10:36 AM        Time spent >45  mins

## 2015-12-08 NOTE — Progress Notes (Signed)
Patient had a 6 beat run of Vtach. Patient is asymptomatic. Strip saved in EPIC. Will continue to monitor.

## 2015-12-09 LAB — CULTURE, BLOOD (ROUTINE X 2)
CULTURE: NO GROWTH
Culture: NO GROWTH

## 2015-12-10 DIAGNOSIS — R079 Chest pain, unspecified: Secondary | ICD-10-CM | POA: Diagnosis not present

## 2015-12-10 DIAGNOSIS — Z7982 Long term (current) use of aspirin: Secondary | ICD-10-CM | POA: Diagnosis not present

## 2015-12-10 DIAGNOSIS — I252 Old myocardial infarction: Secondary | ICD-10-CM | POA: Diagnosis not present

## 2015-12-10 DIAGNOSIS — I129 Hypertensive chronic kidney disease with stage 1 through stage 4 chronic kidney disease, or unspecified chronic kidney disease: Secondary | ICD-10-CM | POA: Diagnosis not present

## 2015-12-10 DIAGNOSIS — E7800 Pure hypercholesterolemia, unspecified: Secondary | ICD-10-CM | POA: Diagnosis not present

## 2015-12-10 DIAGNOSIS — Z79899 Other long term (current) drug therapy: Secondary | ICD-10-CM | POA: Diagnosis not present

## 2015-12-10 DIAGNOSIS — N189 Chronic kidney disease, unspecified: Secondary | ICD-10-CM | POA: Diagnosis not present

## 2015-12-10 DIAGNOSIS — J189 Pneumonia, unspecified organism: Secondary | ICD-10-CM | POA: Diagnosis not present

## 2015-12-10 DIAGNOSIS — I214 Non-ST elevation (NSTEMI) myocardial infarction: Secondary | ICD-10-CM | POA: Diagnosis not present

## 2015-12-10 DIAGNOSIS — Z87891 Personal history of nicotine dependence: Secondary | ICD-10-CM | POA: Diagnosis not present

## 2015-12-11 ENCOUNTER — Inpatient Hospital Stay (HOSPITAL_COMMUNITY): Payer: Medicare Other

## 2015-12-11 ENCOUNTER — Inpatient Hospital Stay (HOSPITAL_COMMUNITY)
Admission: EM | Admit: 2015-12-11 | Discharge: 2015-12-12 | DRG: 291 | Disposition: A | Discharge status: Home Health | Payer: Medicare Other | Source: Other Acute Inpatient Hospital | Attending: Internal Medicine | Admitting: Internal Medicine

## 2015-12-11 ENCOUNTER — Encounter (HOSPITAL_COMMUNITY): Payer: Self-pay | Admitting: *Deleted

## 2015-12-11 DIAGNOSIS — I252 Old myocardial infarction: Secondary | ICD-10-CM | POA: Diagnosis not present

## 2015-12-11 DIAGNOSIS — R079 Chest pain, unspecified: Secondary | ICD-10-CM

## 2015-12-11 DIAGNOSIS — I248 Other forms of acute ischemic heart disease: Secondary | ICD-10-CM | POA: Diagnosis present

## 2015-12-11 DIAGNOSIS — E785 Hyperlipidemia, unspecified: Secondary | ICD-10-CM | POA: Diagnosis present

## 2015-12-11 DIAGNOSIS — D649 Anemia, unspecified: Secondary | ICD-10-CM | POA: Diagnosis present

## 2015-12-11 DIAGNOSIS — I739 Peripheral vascular disease, unspecified: Secondary | ICD-10-CM | POA: Diagnosis present

## 2015-12-11 DIAGNOSIS — Z87891 Personal history of nicotine dependence: Secondary | ICD-10-CM

## 2015-12-11 DIAGNOSIS — I10 Essential (primary) hypertension: Secondary | ICD-10-CM | POA: Diagnosis not present

## 2015-12-11 DIAGNOSIS — N184 Chronic kidney disease, stage 4 (severe): Secondary | ICD-10-CM | POA: Diagnosis present

## 2015-12-11 DIAGNOSIS — R05 Cough: Secondary | ICD-10-CM | POA: Diagnosis not present

## 2015-12-11 DIAGNOSIS — I2510 Atherosclerotic heart disease of native coronary artery without angina pectoris: Secondary | ICD-10-CM

## 2015-12-11 DIAGNOSIS — Z820 Family history of epilepsy and other diseases of the nervous system: Secondary | ICD-10-CM | POA: Diagnosis not present

## 2015-12-11 DIAGNOSIS — Z955 Presence of coronary angioplasty implant and graft: Secondary | ICD-10-CM

## 2015-12-11 DIAGNOSIS — J189 Pneumonia, unspecified organism: Secondary | ICD-10-CM

## 2015-12-11 DIAGNOSIS — I5043 Acute on chronic combined systolic (congestive) and diastolic (congestive) heart failure: Secondary | ICD-10-CM | POA: Diagnosis not present

## 2015-12-11 DIAGNOSIS — Z8249 Family history of ischemic heart disease and other diseases of the circulatory system: Secondary | ICD-10-CM | POA: Diagnosis not present

## 2015-12-11 DIAGNOSIS — Z7982 Long term (current) use of aspirin: Secondary | ICD-10-CM | POA: Diagnosis not present

## 2015-12-11 DIAGNOSIS — Z79899 Other long term (current) drug therapy: Secondary | ICD-10-CM | POA: Diagnosis not present

## 2015-12-11 DIAGNOSIS — I25119 Atherosclerotic heart disease of native coronary artery with unspecified angina pectoris: Secondary | ICD-10-CM | POA: Diagnosis present

## 2015-12-11 DIAGNOSIS — Z9861 Coronary angioplasty status: Secondary | ICD-10-CM

## 2015-12-11 DIAGNOSIS — I214 Non-ST elevation (NSTEMI) myocardial infarction: Secondary | ICD-10-CM | POA: Diagnosis not present

## 2015-12-11 DIAGNOSIS — R5381 Other malaise: Secondary | ICD-10-CM

## 2015-12-11 DIAGNOSIS — I130 Hypertensive heart and chronic kidney disease with heart failure and stage 1 through stage 4 chronic kidney disease, or unspecified chronic kidney disease: Principal | ICD-10-CM | POA: Diagnosis present

## 2015-12-11 DIAGNOSIS — R0789 Other chest pain: Secondary | ICD-10-CM | POA: Diagnosis not present

## 2015-12-11 HISTORY — DX: Acute myocardial infarction, unspecified: I21.9

## 2015-12-11 LAB — BRAIN NATRIURETIC PEPTIDE: B Natriuretic Peptide: 298.7 pg/mL — ABNORMAL HIGH (ref 0.0–100.0)

## 2015-12-11 LAB — CREATININE, SERUM
CREATININE: 1.86 mg/dL — AB (ref 0.61–1.24)
GFR calc Af Amer: 37 mL/min — ABNORMAL LOW (ref >60)
GFR calc non Af Amer: 32 mL/min — ABNORMAL LOW (ref >60)

## 2015-12-11 LAB — COMPREHENSIVE METABOLIC PANEL
ALK PHOS: 106 U/L (ref 38–126)
ALT: 101 U/L — ABNORMAL HIGH (ref 17–63)
ANION GAP: 8 (ref 5–15)
AST: 52 U/L — ABNORMAL HIGH (ref 15–41)
Albumin: 2.9 g/dL — ABNORMAL LOW (ref 3.5–5.0)
BUN: 15 mg/dL (ref 6–20)
CALCIUM: 8.5 mg/dL — AB (ref 8.9–10.3)
CO2: 21 mmol/L — AB (ref 22–32)
Chloride: 112 mmol/L — ABNORMAL HIGH (ref 101–111)
Creatinine, Ser: 1.77 mg/dL — ABNORMAL HIGH (ref 0.61–1.24)
GFR, EST AFRICAN AMERICAN: 39 mL/min — AB (ref >60)
GFR, EST NON AFRICAN AMERICAN: 34 mL/min — AB (ref >60)
Glucose, Bld: 86 mg/dL (ref 65–99)
Potassium: 4.1 mmol/L (ref 3.5–5.1)
SODIUM: 141 mmol/L (ref 135–145)
TOTAL PROTEIN: 5.7 g/dL — AB (ref 6.5–8.1)
Total Bilirubin: 0.5 mg/dL (ref 0.3–1.2)

## 2015-12-11 LAB — PROTIME-INR
INR: 1.09
PROTHROMBIN TIME: 14.2 s (ref 11.4–15.2)

## 2015-12-11 LAB — CBC
HCT: 29.3 % — ABNORMAL LOW (ref 39.0–52.0)
HCT: 36.1 % — ABNORMAL LOW (ref 39.0–52.0)
HEMOGLOBIN: 9.3 g/dL — AB (ref 13.0–17.0)
Hemoglobin: 11.5 g/dL — ABNORMAL LOW (ref 13.0–17.0)
MCH: 29.9 pg (ref 26.0–34.0)
MCH: 30 pg (ref 26.0–34.0)
MCHC: 31.7 g/dL (ref 30.0–36.0)
MCHC: 31.9 g/dL (ref 30.0–36.0)
MCV: 94.2 fL (ref 78.0–100.0)
MCV: 94.3 fL (ref 78.0–100.0)
PLATELETS: 165 10*3/uL (ref 150–400)
Platelets: 128 10*3/uL — ABNORMAL LOW (ref 150–400)
RBC: 3.11 MIL/uL — AB (ref 4.22–5.81)
RBC: 3.83 MIL/uL — AB (ref 4.22–5.81)
RDW: 14.2 % (ref 11.5–15.5)
RDW: 14 % (ref 11.5–15.5)
WBC: 8.8 10*3/uL (ref 4.0–10.5)
WBC: 10.3 10*3/uL (ref 4.0–10.5)

## 2015-12-11 LAB — TROPONIN I
TROPONIN I: 0.05 ng/mL — AB (ref <0.03)
Troponin I: 0.05 ng/mL (ref <0.03)
Troponin I: 0.1 ng/mL (ref <0.03)

## 2015-12-11 LAB — ECHOCARDIOGRAM LIMITED
Height: 66 in
WEIGHTICAEL: 3208 [oz_av]

## 2015-12-11 LAB — HEPARIN LEVEL (UNFRACTIONATED): Heparin Unfractionated: 0.33 IU/mL (ref 0.30–0.70)

## 2015-12-11 LAB — LIPID PANEL
CHOLESTEROL: 123 mg/dL (ref 0–200)
HDL: 29 mg/dL — AB (ref >40)
LDL Cholesterol: 75 mg/dL (ref 0–99)
TRIGLYCERIDES: 94 mg/dL (ref <150)
Total CHOL/HDL Ratio: 4.2 RATIO
VLDL: 19 mg/dL (ref 0–40)

## 2015-12-11 LAB — TSH: TSH: 4.83 u[IU]/mL — AB (ref 0.350–4.500)

## 2015-12-11 MED ORDER — SODIUM CHLORIDE 0.9% FLUSH
3.0000 mL | Freq: Two times a day (BID) | INTRAVENOUS | Status: DC
  Administered 2015-12-11: 3 mL via INTRAVENOUS

## 2015-12-11 MED ORDER — SODIUM BICARBONATE 650 MG PO TABS
650.0000 mg | ORAL_TABLET | Freq: Two times a day (BID) | ORAL | Status: DC
  Administered 2015-12-11 – 2015-12-12 (×3): 650 mg via ORAL
  Filled 2015-12-11 (×3): qty 1

## 2015-12-11 MED ORDER — VITAMIN D 1000 UNITS PO TABS
1000.0000 [IU] | ORAL_TABLET | Freq: Every day | ORAL | Status: DC
  Administered 2015-12-11 – 2015-12-12 (×2): 1000 [IU] via ORAL
  Filled 2015-12-11 (×2): qty 1

## 2015-12-11 MED ORDER — SUCRALFATE 1 GM/10ML PO SUSP
1.0000 g | Freq: Three times a day (TID) | ORAL | Status: DC
  Administered 2015-12-11 – 2015-12-12 (×3): 1 g via ORAL
  Filled 2015-12-11 (×4): qty 10

## 2015-12-11 MED ORDER — BENZONATATE 100 MG PO CAPS: 100.0000 mg | ORAL_CAPSULE | Freq: Three times a day (TID) | ORAL | Status: DC | PRN

## 2015-12-11 MED ORDER — ASPIRIN EC 81 MG PO TBEC
81.0000 mg | DELAYED_RELEASE_TABLET | Freq: Every day | ORAL | Status: DC
  Administered 2015-12-11 – 2015-12-12 (×2): 81 mg via ORAL
  Filled 2015-12-11 (×2): qty 1

## 2015-12-11 MED ORDER — CEFUROXIME AXETIL 500 MG PO TABS
500.0000 mg | ORAL_TABLET | Freq: Two times a day (BID) | ORAL | Status: DC
  Administered 2015-12-11 – 2015-12-12 (×3): 500 mg via ORAL
  Filled 2015-12-11 (×3): qty 1

## 2015-12-11 MED ORDER — AZITHROMYCIN 250 MG PO TABS
500.0000 mg | ORAL_TABLET | Freq: Every day | ORAL | Status: DC
Start: 2015-12-11 — End: 2015-12-12
  Administered 2015-12-11 – 2015-12-12 (×2): 500 mg via ORAL
  Filled 2015-12-11 (×3): qty 2

## 2015-12-11 MED ORDER — METOPROLOL TARTRATE 12.5 MG HALF TABLET
12.5000 mg | ORAL_TABLET | Freq: Two times a day (BID) | ORAL | Status: DC
  Administered 2015-12-11 – 2015-12-12 (×3): 12.5 mg via ORAL
  Filled 2015-12-11 (×3): qty 1

## 2015-12-11 MED ORDER — HEPARIN (PORCINE) IN NACL 100-0.45 UNIT/ML-% IJ SOLN: 1100.0000 [IU]/h | INTRAMUSCULAR | Status: DC

## 2015-12-11 MED ORDER — GUAIFENESIN ER 600 MG PO TB12
1200.0000 mg | ORAL_TABLET | Freq: Two times a day (BID) | ORAL | Status: DC
  Administered 2015-12-11 – 2015-12-12 (×2): 1200 mg via ORAL
  Filled 2015-12-11 (×2): qty 2

## 2015-12-11 MED ORDER — ATORVASTATIN CALCIUM 80 MG PO TABS
80.0000 mg | ORAL_TABLET | Freq: Every day | ORAL | Status: DC
  Administered 2015-12-11: 80 mg via ORAL
  Filled 2015-12-11: qty 1

## 2015-12-11 MED ORDER — AMLODIPINE BESYLATE 10 MG PO TABS
10.0000 mg | ORAL_TABLET | Freq: Every day | ORAL | Status: DC
  Administered 2015-12-11 – 2015-12-12 (×2): 10 mg via ORAL
  Filled 2015-12-11 (×2): qty 1

## 2015-12-11 MED ORDER — FUROSEMIDE 10 MG/ML IJ SOLN
40.0000 mg | Freq: Four times a day (QID) | INTRAMUSCULAR | Status: AC
  Administered 2015-12-11: 40 mg via INTRAVENOUS
  Filled 2015-12-11: qty 4

## 2015-12-11 MED ORDER — HYDRALAZINE HCL 50 MG PO TABS
100.0000 mg | ORAL_TABLET | Freq: Three times a day (TID) | ORAL | Status: DC
  Administered 2015-12-11 – 2015-12-12 (×3): 100 mg via ORAL
  Filled 2015-12-11 (×3): qty 2

## 2015-12-11 MED ORDER — HEPARIN SODIUM (PORCINE) 5000 UNIT/ML IJ SOLN
5000.0000 [IU] | Freq: Three times a day (TID) | INTRAMUSCULAR | Status: DC
  Administered 2015-12-11 (×2): 5000 [IU] via SUBCUTANEOUS
  Filled 2015-12-11 (×2): qty 1

## 2015-12-11 MED ORDER — PANTOPRAZOLE SODIUM 40 MG PO TBEC
40.0000 mg | DELAYED_RELEASE_TABLET | Freq: Every day | ORAL | Status: DC
  Administered 2015-12-11 – 2015-12-12 (×2): 40 mg via ORAL
  Filled 2015-12-11 (×2): qty 1

## 2015-12-11 MED ORDER — ISOSORBIDE MONONITRATE ER 60 MG PO TB24
60.0000 mg | ORAL_TABLET | Freq: Every day | ORAL | Status: DC
  Administered 2015-12-11 – 2015-12-12 (×2): 60 mg via ORAL
  Filled 2015-12-11 (×2): qty 1

## 2015-12-11 MED ORDER — CLOPIDOGREL BISULFATE 75 MG PO TABS: 75.0000 mg | ORAL_TABLET | Freq: Every day | ORAL | Status: DC

## 2015-12-11 MED ORDER — CLOPIDOGREL BISULFATE 75 MG PO TABS: 300.0000 mg | ORAL_TABLET | Freq: Once | ORAL | Status: DC

## 2015-12-11 NOTE — Progress Notes (Signed)
  Echocardiogram 2D Echocardiogram has been performed.  Kevin [name] 12/11/2015, 3:46 PM

## 2015-12-11 NOTE — Consult Note (Addendum)
Medical Consultation   Kevin [name]  ZOX:[id number]  DOB: July 13, 1930  DOA: 12/11/2015  PCP: Glenda Chroman, MD   Outpatient Specialists: Cardiology   Requesting physician: Dr Debara Pickett - Cardiology  Reason for consultation: Chest pain w/ recent admission for CAP   History of Present Illness: Kevin [name] is an 82 y.o. male w/ pmhx significant for CAD s/p cath w/ DES, CKD, Claudication, HLD, HTN, tobacco abuse who presented overnight w/ complaints of CP concerning for CAD/ACS. Initially admitted by Cardiology and initially Dx as NSTEMI and acute on chrnoic combined systolic and diastolic CHF. Troponins trended and patient diuresed. After monitoring and further evaluation, cardiology team feels that patient's condition is not related to ACS.   At time of my evaluation patient states that he is completely chest pain free and feels at his baseline. States that the mild swelling he had in his legs and hands is significantly improved. Denies any worsening respiratory status since beginning antibiotics after previous admission for CPAP. Denies any active fevers, productive cough, abdominal pain, palpitations, change in mentation, diarrhea.   Review of Systems:  ROS As per HPI otherwise 10 point review of systems negative.    Past Medical History: Past Medical History:  Diagnosis Date  . Arthritis   . CAD (coronary artery disease)    a. s/p DES to LAD (2014)  b. s/p LHC (09/2014) with patent LAD stent and stable dz from previous cath   . Chronic anemia   . CKD (chronic kidney disease), stage IV ([place])   . Claudication ([place])    R leg with bilateral femoral bruits.  . Dyslipidemia   . Essential hypertension, benign   . Tobacco abuse     Past Surgical History: Past Surgical History:  Procedure Laterality Date  . Bilateral carpal tunnel release Bilateral   . CARDIAC CATHETERIZATION N/A 10/10/2014   Procedure: Left Heart Cath and Coronary Angiography;  Surgeon: Belva Crome, MD;  Location: [facility];  Service: Cardiovascular;  Laterality: N/A;  . LEFT HEART CATHETERIZATION WITH CORONARY ANGIOGRAM N/A 02/09/2012   Procedure: LEFT HEART CATHETERIZATION WITH CORONARY ANGIOGRAM;  Surgeon: Peter M Martinique, MD; left main 10%, LAD 80%/95%, D1 patent, CFX 70% at OM 2, dCFX 90%, RCA 100% with L-R collaterals    . PERCUTANEOUS CORONARY STENT INTERVENTION (PCI-S) N/A 02/11/2012   Procedure: PERCUTANEOUS CORONARY STENT INTERVENTION (PCI-S);  Surgeon: Burnell Blanks, MD; 3.0 x 28 mm Promus Premier DES to the mid LAD   . REPAIR OF PERFORATED ULCER       Allergies:  No Known Allergies   Social History:  reports that he quit smoking about 3 years ago. His smoking use included Cigarettes. He started smoking about 47 years ago. He has a 22.50 pack-year smoking history. He has never used smokeless tobacco. He reports that he does not drink alcohol or use drugs.   Family History: Family History  Problem Relation Age of Onset  . Hypertension Mother   . Alzheimer's disease Mother   . Other Father     brain tumor  . Heart disease Brother   . Heart disease Sister      Physical Exam: Vitals:   12/11/15 0100 12/11/15 0500  BP: 135/71 (!) 153/67  Pulse: 78 76  Resp: 18   Temp: 98.1 F (36.7 C) 98.7 F (37.1 C)  TempSrc: Oral Oral  SpO2: 99% 99%  Weight:  90.9 kg (200 lb 8 oz)   Height: 5\' 6"  (1.676 m)     General:  Appears calm and comfortable Eyes:  PERRL, EOMI, normal lids, iris ENT:  grossly normal hearing, lips & tongue, mmm Neck:  no LAD, masses or thyromegaly Cardiovascular:  RRR, no m/r/g. No LE edema.  Respiratory:  Minimally diminished breath sounds in bases. no w/r/r. Normal respiratory effort. Abdomen:  soft, ntnd, NABS Skin:  no rash or induration seen on limited exam Musculoskeletal:  grossly normal tone BUE/BLE, good ROM, no bony abnormality Psychiatric:  grossly normal mood and affect, speech fluent and appropriate,  AOx3 Neurologic:  CN 2-12 grossly intact, moves all extremities in coordinated fashion, sensation intact  Data reviewed:  I have personally reviewed following labs and imaging studies Labs:  CBC:  Recent Labs Lab 12/04/15 2323 12/05/15 0530 12/06/15 0501 12/07/15 0426 12/11/15 0758  WBC 10.1 19.0* 17.3* 15.6* 8.8  NEUTROABS 9.4*  --   --   --   --   HGB 10.9* 10.5* 9.8* 9.6* 9.3*  HCT 33.5* 32.6* 31.3* 30.6* 29.3*  MCV 94.4 94.8 95.4 96.5 94.2  PLT 124* 120* 111* 111* 128*    Basic Metabolic Panel:  Recent Labs Lab 12/05/15 0530 12/06/15 0501 12/07/15 0426 12/08/15 0353 12/11/15 0758  NA 139 143 143 144 141  K 4.5 4.3 4.4 4.5 4.1  CL 112* 118* 118* 115* 112*  CO2 18* 19* 16* 20* 21*  GLUCOSE 81 75 88 102* 86  BUN 28* 28* 24* 21* 15  CREATININE 2.37* 2.34* 2.11* 1.90* 1.77*  CALCIUM 7.7* 7.7* 8.1* 8.5* 8.5*   GFR Estimated Creatinine Clearance: 33.4 mL/min (by C-G formula based on SCr of 1.77 mg/dL (H)). Liver Function Tests:  Recent Labs Lab 12/05/15 0530 12/06/15 0501 12/07/15 0426 12/08/15 0353 12/11/15 0758  AST 873* 272* 120* 73* 52*  ALT 751* 394* 259* 189* 101*  ALKPHOS 109 86 87 127* 106  BILITOT 1.2 0.8 0.6 0.8 0.5  PROT 5.6* 5.3* 5.3* 5.2* 5.7*  ALBUMIN 2.9* 2.6* 2.7* 2.8* 2.9*   No results for input(s): LIPASE, AMYLASE in the last 168 hours. No results for input(s): AMMONIA in the last 168 hours. Coagulation profile  Recent Labs Lab 12/11/15 0758  INR 1.09    Cardiac Enzymes:  Recent Labs Lab 12/04/15 2323 12/05/15 0239 12/05/15 0530 12/11/15 0152 12/11/15 0758  TROPONINI 0.28* 0.32* 0.34* 0.05* 0.05*   BNP: Invalid input(s): POCBNP CBG: No results for input(s): GLUCAP in the last 168 hours. D-Dimer No results for input(s): DDIMER in the last 72 hours. Hgb A1c No results for input(s): HGBA1C in the last 72 hours. Lipid Profile  Recent Labs  12/11/15 0152  CHOL 123  HDL 29*  LDLCALC 75  TRIG 94  CHOLHDL 4.2    Thyroid function studies  Recent Labs  12/11/15 0152  TSH 4.830*   Anemia work up No results for input(s): VITAMINB12, FOLATE, FERRITIN, TIBC, IRON, RETICCTPCT in the last 72 hours. Urinalysis    Component Value Date/Time   COLORURINE AMBER (A) 12/05/2015 0930   APPEARANCEUR CLOUDY (A) 12/05/2015 0930   LABSPEC 1.021 12/05/2015 0930   PHURINE 5.0 12/05/2015 0930   GLUCOSEU NEGATIVE 12/05/2015 0930   HGBUR NEGATIVE 12/05/2015 0930   BILIRUBINUR SMALL (A) 12/05/2015 0930   KETONESUR NEGATIVE 12/05/2015 0930   PROTEINUR 30 (A) 12/05/2015 0930   UROBILINOGEN 0.2 02/08/2012 1227   NITRITE NEGATIVE 12/05/2015 0930   LEUKOCYTESUR SMALL (A) 12/05/2015 0930  Microbiology Recent Results (from the past 240 hour(s))  Respiratory Panel by PCR     Status: None   Collection Time: 12/04/15 10:09 PM  Result Value Ref Range Status   Adenovirus NOT DETECTED NOT DETECTED Final   Coronavirus 229E NOT DETECTED NOT DETECTED Final   Coronavirus HKU1 NOT DETECTED NOT DETECTED Final   Coronavirus NL63 NOT DETECTED NOT DETECTED Final   Coronavirus OC43 NOT DETECTED NOT DETECTED Final   Metapneumovirus NOT DETECTED NOT DETECTED Final   Rhinovirus / Enterovirus NOT DETECTED NOT DETECTED Final   Influenza A NOT DETECTED NOT DETECTED Final   Influenza B NOT DETECTED NOT DETECTED Final   Parainfluenza Virus 1 NOT DETECTED NOT DETECTED Final   Parainfluenza Virus 2 NOT DETECTED NOT DETECTED Final   Parainfluenza Virus 3 NOT DETECTED NOT DETECTED Final   Parainfluenza Virus 4 NOT DETECTED NOT DETECTED Final   Respiratory Syncytial Virus NOT DETECTED NOT DETECTED Final   Bordetella pertussis NOT DETECTED NOT DETECTED Final   Chlamydophila pneumoniae NOT DETECTED NOT DETECTED Final   Mycoplasma pneumoniae NOT DETECTED NOT DETECTED Final  Culture, blood (x 2)     Status: None   Collection Time: 12/04/15 11:23 PM  Result Value Ref Range Status   Specimen Description BLOOD LEFT ANTECUBITAL  Final    Special Requests BOTTLES DRAWN AEROBIC ONLY [place]  Final   Culture NO GROWTH 5 DAYS  Final   Report Status 12/09/2015 FINAL  Final  Culture, blood (x 2)     Status: None   Collection Time: 12/04/15 11:29 PM  Result Value Ref Range Status   Specimen Description BLOOD LEFT HAND  Final   Special Requests BOTTLES DRAWN AEROBIC AND ANAEROBIC 5CC EA  Final   Culture NO GROWTH 5 DAYS  Final   Report Status 12/09/2015 FINAL  Final  Urine culture     Status: None   Collection Time: 12/05/15  9:30 AM  Result Value Ref Range Status   Specimen Description URINE, RANDOM  Final   Special Requests NONE  Final   Culture NO GROWTH  Final   Report Status 12/06/2015 FINAL  Final       Inpatient Medications:   Scheduled Meds: . amLODipine  10 mg Oral Daily  . aspirin EC  81 mg Oral Daily  . atorvastatin  80 mg Oral QHS  . azithromycin  500 mg Oral Daily  . cefUROXime  500 mg Oral BID  . cholecalciferol  1,000 Units Oral Daily  . clopidogrel  300 mg Oral Once  . furosemide  40 mg Intravenous Q6H  . heparin  5,000 Units Subcutaneous Q8H  . hydrALAZINE  100 mg Oral Q8H  . isosorbide mononitrate  60 mg Oral Daily  . metoprolol tartrate  12.5 mg Oral BID  . pantoprazole  40 mg Oral Daily  . sodium bicarbonate  650 mg Oral BID  . sodium chloride flush  3 mL Intravenous Q12H   Continuous Infusions:   Radiological Exams on Admission: Dg Chest 2 View  Result Date: 12/11/2015 CLINICAL DATA:  81 year old male with history of cough. Evaluate for pneumonia. No fever. EXAM: CHEST  2 VIEW COMPARISON:  Chest x-ray 12/10/2015. FINDINGS: Lung volumes are normal. No consolidative airspace disease. No pleural effusions. No pneumothorax. No pulmonary nodule or mass noted. Pulmonary vasculature and the cardiomediastinal silhouette are within normal limits. Atherosclerotic calcifications in the thoracic aorta. Probable coronary artery stent noted in the region of the left anterior descending coronary artery.  Numerous surgical  clips project over the epigastric region. IMPRESSION: 1. No radiographic evidence of acute cardiopulmonary disease. 2. Aortic atherosclerosis. Electronically Signed   By: Vinnie Langton M.D.   On: 12/11/2015 09:41    Impression/Recommendations Principal Problem:   NSTEMI (non-ST elevated myocardial infarction) [facility]) Active Problems:   Essential hypertension, benign   Hyperlipidemia   Chest pain   CAD- S/P PCI 2014   Acute on chronic combined systolic and diastolic heart failure, NYHA class 2 (HCC)  Chest pain: Likely pleuritic vs GI. R/o Cardiac per cards. Lengthy discussion had w/ pt and sister regarding possible causes of CP. Pt under increased stress from recent medical problems and GERD w/ esophagitis is a definite possibility. Additionally, CAP can leave pts w/ residual CP for up to 8 wks.  - Protonix 40 Qday - recommend continuing at DC - Carafate - Encouraged deep respirations regularly w/ incentive spirometry or daily exercise - Tessalon to help w/ anaesthetizing the respiratory tract.  - Once pt cleared from a cardiac standpoint he is safe for discharge from a medical standpoint.   CAP. Reason for recent admission. CXR reasurring. Lung exam reasurring. Pt feels at baseline from a respiratory standpoint.  - Complete Omnicef and Azithro (set to finish on 12/12/15) - Mucinex.  CKD: Cr 1.77 at time of my evaluation. Difficult to determine baseline but current value significantly below recent values at high as 2.39. downtrending value reassuring that CKD will continue to improve. No evidence of AKI at this time.   Deconditioning: family concerned that pt unable to care for himself, despite reporting pt able to perform all ADLs. Family feels that in home nursing from time to time will help prevent pts condition from worsening as they will have additional guidance as he recovers from his CAP. This request is reasonable as pt is at high risk for returning to the  hospital.  - Case mgt for home health. I have discussed the case specifically w/ Hassan Rowan of [employer] on [facility] who has graciously agreed to help expedite this process.     Thank you for this consultation.  Our [facility] hospitalist team is available if additional services are needed.    MERRELL, DAVID J M.D. [facility] 12/11/2015, 12:10 PM

## 2015-12-11 NOTE — Progress Notes (Signed)
Pt arrived from [facility] for admit. MD on call paged and informed. Pt is currently pain free, VS stable-will keep NPO for possible testing in am. Jessie Foot, RN

## 2015-12-11 NOTE — Progress Notes (Signed)
ANTICOAGULATION CONSULT NOTE - Initial Consult  Pharmacy Consult for Heparin Indication: chest pain/ACS  No Known Allergies  Patient Measurements:   Heparin Dosing Weight: 80 kg  Vital Signs: Temp: 98.1 F (36.7 C) (11/14 0100) Temp Source: Oral (11/14 0100) BP: 135/71 (11/14 0100) Pulse Rate: 78 (11/14 0100)  Labs (at [facility]):   WBC  9.2 Hgb  10.9 Hct  33.4 Plt  149    Recent Labs  12/08/15 0353  CREATININE 1.90*    Estimated Creatinine Clearance: 31.4 mL/min (by C-G formula based on SCr of 1.9 mg/dL (H)).   Medical History: Past Medical History:  Diagnosis Date  . Arthritis   . CAD (coronary artery disease)    a. s/p DES to LAD (2014)  b. s/p LHC (09/2014) with patent LAD stent and stable dz from previous cath   . Chronic anemia   . CKD (chronic kidney disease), stage IV ([place])   . Claudication ([place])    R leg with bilateral femoral bruits.  . Dyslipidemia   . Essential hypertension, benign   . Tobacco abuse     Medications:  Prescriptions Prior to Admission  Medication Sig Dispense Refill Last Dose  . amLODipine (NORVASC) 10 MG tablet Take 10 mg by mouth daily.   12/04/2015 at Unknown time  . aspirin EC 81 MG tablet Take 81 mg by mouth daily.   12/04/2015 at Unknown time  . atorvastatin (LIPITOR) 80 MG tablet TAKE ONE TABLET BY MOUTH AT BEDTIME (Patient taking differently: TAKE 80 mg BY MOUTH AT BEDTIME) 30 tablet 3 Past Week at Unknown time  . azithromycin (ZITHROMAX) 500 MG tablet Take 1 tablet (500 mg total) by mouth daily. 5 tablet 0   . cefdinir (OMNICEF) 300 MG capsule Take 1 capsule (300 mg total) by mouth 2 (two) times daily. 10 capsule 0   . Cholecalciferol (VITAMIN D-3) 1000 UNITS CAPS Take 1,000 Units by mouth daily.    12/04/2015 at Unknown time  . guaiFENesin (MUCINEX) 600 MG 12 hr tablet Take 1 tablet (600 mg total) by mouth 2 (two) times daily. 20 tablet 0   . hydrALAZINE (APRESOLINE) 100 MG tablet TAKE ONE TABLET BY MOUTH THREE  TIMES DAILY. (Patient taking differently: TAKE 100 mg BY MOUTH twice TIMES DAILY.) 90 tablet 5 12/04/2015 at Unknown time  . isosorbide mononitrate (IMDUR) 60 MG 24 hr tablet TAKE ONE TABLET BY MOUTH DAILY. (Patient taking differently: TAKE 60 mg BY MOUTH DAILY.) 30 tablet 6 12/04/2015 at Unknown time  . metoprolol tartrate (LOPRESSOR) 25 MG tablet Take 0.5 tablets (12.5 mg total) by mouth 2 (two) times daily. 90 tablet 3 Past Week at Unknown time  . nitroGLYCERIN (NITROSTAT) 0.4 MG SL tablet Place 1 tablet (0.4 mg total) under the tongue every 5 (five) minutes as needed for chest pain (up to 3 doses). 25 tablet 4 12/04/2015 at Unknown time  . Polysaccharide Iron Complex (POLY-IRON 150 PO) Take 150 mg by mouth daily.   12/04/2015 at Unknown time  . sodium bicarbonate 650 MG tablet Take 1 tablet (650 mg total) by mouth 2 (two) times daily. 60 tablet 0 12/04/2015 at Unknown time    Assessment: 82 y.o. male with chest pain for heparin.  Lovenox 90 mg SQ given at [facility] at 2140  Goal of Therapy:  Heparin level 0.3-0.7 units/ml Monitor platelets by anticoagulation protocol: Yes   Plan:  Start heparin 1100 units/hr at 1000 Check heparin level in 8 hours.   Caryl Pina 12/11/2015,2:37  AM

## 2015-12-11 NOTE — H&P (Addendum)
CARDIOLOGY INPATIENT HISTORY AND PHYSICAL EXAMINATION NOTE  Patient ID: Kevin [name] MRN: [id number], DOB/AGE: 1930-04-11   Admit date: 12/11/2015   Primary Physician: Glenda Chroman, MD Primary Cardiologist: Domenic Polite MD  Reason for admission: chest pain   HPI: This is a 82 y.o. AAM with known LAD PCI w/DES in 01/2012, CTO of RCA and mild to mod dLCx dz w/last LHC in 09/2014, HTN, HLD, CKD 3b (eGFR 31) who recently had admission for PNA w/elevated troponin and was evaluated by cards and managed as type II MI from sepsis presented with CP and minimally elevated trop of 0.05.  Pt reports that the CP is substernal in position, 5/10 in intensity, burning and tight in quality, sudden in onset, does not radiate, associated with shivering and feeling cold, aggravates with nothign and relieves with nothing. It improved on its own after 30 minutes. CP is similar to one he presented with last week and also similar to his CP in 2014 at which time he had PCI. Patient is swollen all over. His dry wt is 197 lbs. Pt also reports SOB on exertion, leg swelling and mild orthopnea.  During his sepsis episode, his trop peaked at 0.34.   Problem List: Past Medical History:  Diagnosis Date  . Arthritis   . CAD (coronary artery disease)    a. s/p DES to LAD (2014)  b. s/p LHC (09/2014) with patent LAD stent and stable dz from previous cath   . Chronic anemia   . CKD (chronic kidney disease), stage IV ([place])   . Claudication ([place])    R leg with bilateral femoral bruits.  . Dyslipidemia   . Essential hypertension, benign   . Tobacco abuse     Past Surgical History:  Procedure Laterality Date  . Bilateral carpal tunnel release Bilateral   . CARDIAC CATHETERIZATION N/A 10/10/2014   Procedure: Left Heart Cath and Coronary Angiography;  Surgeon: Belva Crome, MD;  Location: [facility];  Service: Cardiovascular;  Laterality: N/A;  . LEFT HEART CATHETERIZATION WITH CORONARY ANGIOGRAM N/A 02/09/2012   Procedure: LEFT HEART CATHETERIZATION WITH CORONARY ANGIOGRAM;  Surgeon: Peter M Martinique, MD; left main 10%, LAD 80%/95%, D1 patent, CFX 70% at OM 2, dCFX 90%, RCA 100% with L-R collaterals    . PERCUTANEOUS CORONARY STENT INTERVENTION (PCI-S) N/A 02/11/2012   Procedure: PERCUTANEOUS CORONARY STENT INTERVENTION (PCI-S);  Surgeon: Burnell Blanks, MD; 3.0 x 28 mm Promus Premier DES to the mid LAD   . REPAIR OF PERFORATED ULCER       Allergies: No Known Allergies   Home Medications Current Facility-Administered Medications  Medication Dose Route Frequency Provider Last Rate Last Dose  . amLODipine (NORVASC) tablet 10 mg  10 mg Oral Daily Flossie Dibble, MD      . aspirin EC tablet 81 mg  81 mg Oral Daily Flossie Dibble, MD      . atorvastatin (LIPITOR) tablet 80 mg  80 mg Oral QHS Flossie Dibble, MD      . cholecalciferol (VITAMIN D) tablet 1,000 Units  1,000 Units Oral Daily Flossie Dibble, MD      . furosemide (LASIX) injection 40 mg  40 mg Intravenous Q6H Flossie Dibble, MD      . hydrALAZINE (APRESOLINE) tablet 100 mg  100 mg Oral Q8H Flossie Dibble, MD      . isosorbide mononitrate (IMDUR) 24 hr tablet 60 mg  60 mg Oral Daily Flossie Dibble, MD      .  metoprolol tartrate (LOPRESSOR) tablet 12.5 mg  12.5 mg Oral BID Flossie Dibble, MD      . sodium bicarbonate tablet 650 mg  650 mg Oral BID Flossie Dibble, MD      . sodium chloride flush (NS) 0.9 % injection 3 mL  3 mL Intravenous Q12H Flossie Dibble, MD         Family History  Problem Relation Age of Onset  . Hypertension Mother   . Alzheimer's disease Mother   . Other Father     brain tumor  . Heart disease Brother   . Heart disease Sister      Social History   Social History  . Marital status: Widowed    Spouse name: N/A  . Number of children: 5  . Years of education: N/A   Occupational History  . 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 Smoking status: Former Smoker      Packs/day: 0.50    Years: 45.00    Types: Cigarettes    Start date: 01/28/1968    Quit date: 02/06/2012  . Smokeless tobacco: Never Used  . Alcohol use No  . Drug use: No  . Sexual activity: Not on file   Other Topics Concern  . Not on file   Social History Narrative   Lives in [place]     Review of Systems: General: chills, unintentional weight gain and fatigue Cardiovascular: chest pain, dyspnea, leg swelling Dermatological: negative for rash Respiratory: negative for cough or wheezing Urologic: negative for hematuria Abdominal: negative for nausea, vomiting, diarrhea, bright red blood per rectum, melena, or hematemesis Neurologic: negative for visual changes, syncope, or dizziness Endocrine: no diabetes, no hypothyroidism Immunological: no lymph adenopathy Psych: non homicidal/suicidal  Physical Exam: Vitals: BP 135/71 (BP Location: Left Arm)   Pulse 78   Temp 98.1 F (36.7 C) (Oral)   Resp 18   SpO2 99%  General: not in acute distress Neck: JVP elevated, at the base of neck while sitting Heart: regular rate and rhythm, S1, S2, no murmurs  Lungs: CTAB  GI: non tender, non distended, bowel sounds present Extremities: anasarca, both upper and lower extremities have 1-2+ symmetric bilateral pitting edema Neuro: AAO x 3  Psych: normal affect, no anxiety   Labs:  No results found for this or any previous visit (from the past 24 hour(s)).   Radiology/Studies: Dg Chest 2 View  Result Date: 12/06/2015 CLINICAL DATA:  Fever and shortness of breath today. History of coronary artery disease with stent placement. Former smoker. EXAM: CHEST  2 VIEW COMPARISON:  Portable chest x-ray of August 03, 2015 FINDINGS: The lungs are well-expanded. There is increased density at both lung bases. There is no pleural effusion or pneumothorax. The cardiac silhouette is mildly enlarged. The central pulmonary vascularity is prominent but stable. The mediastinum is normal in width.  The bony thorax is unremarkable. IMPRESSION: Increased density at both lung bases consistent with atelectasis or developing pneumonia. Mild cardiomegaly without pulmonary vascular congestion. Followup PA and lateral chest X-ray is recommended in 3-4 weeks following trial of antibiotic therapy to ensure resolution and exclude underlying malignancy. Electronically Signed   By: David  Martinique M.D.   On: 12/06/2015 10:53   Nm Hepatobiliary Liver Func  Result Date: 12/05/2015 CLINICAL DATA:  Right upper quadrant abdominal pain. Symptoms for 2 days EXAM: NUCLEAR MEDICINE HEPATOBILIARY IMAGING TECHNIQUE: Sequential images of the abdomen were obtained  out to 60 minutes following intravenous administration of radiopharmaceutical. RADIOPHARMACEUTICALS:  5.1 mCi Tc-20m Choletec IV COMPARISON:  None. FINDINGS: Prompt uptake and biliary excretion of activity by the liver is seen. Gallbladder activity is visualized, consistent with patency of cystic duct. Biliary activity passes into small bowel, consistent with patent common bile duct. IMPRESSION: Normal scan.  Patent cystic and common bile ducts. Electronically Signed   By: JMonte FantasiaM.D.   On: 12/05/2015 14:48   [place]Renal  Result Date: 12/06/2015 CLINICAL DATA:  Acute renal injury EXAM: RENAL / URINARY TRACT ULTRASOUND COMPLETE COMPARISON:  02/09/2012 ultrasound of the kidneys FINDINGS: Right Kidney: Length: 10 cm. Echogenicity within normal limits. No mass or hydronephrosis visualized. There is an anechoic simple appearing 1.2 x 1.2 x 1.4 cm lower pole renal cyst. Left Kidney: Length: 9.4 cm. Echogenicity within normal limits. No mass or hydronephrosis visualized. There is an exophytic well-circumscribed 5.6 x 3.8 x 5 cm interpolar anechoic simple cyst, previously measuring 5.1 x 4.1 x 3.7 cm. Adjacent to this is a 1 x 0.9 x 0.5 cm exophytic cyst too small to further characterize. Bladder: Appears normal for degree of bladder distention. IMPRESSION: No  obstructive uropathy or significant loss of cortical -medullary distinction. Bilateral renal cysts as above described. Electronically Signed   By: DAshley RoyaltyM.D.   On: 12/06/2015 21:48   UKoreaAbdomen Limited  Result Date: 12/05/2015 CLINICAL DATA:  Elevated liver function studies. Right upper quadrant pain for 2 days. History of hypertension and chronic kidney disease. EXAM: [place]ABDOMEN LIMITED - RIGHT UPPER QUADRANT COMPARISON:  None. FINDINGS: Gallbladder: Cholelithiasis with several stones in the gallbladder and extending to the gallbladder neck. Largest stone measures about 1 cm diameter. Mild gallbladder wall thickening at 3.8 mm maximum. Mild pericholecystic edema. Murphy's sign is negative. Common bile duct: Diameter: 3.5 mm, normal Liver: Mild diffusely increased liver parenchymal echotexture suggesting fatty infiltration. No focal lesions identified. IMPRESSION: Cholelithiasis with mild gallbladder wall thickening and edema. Although Murphy's sign is negative, changes may indicate acute cholecystitis in the appropriate clinical setting. No bile duct dilatation. Electronically Signed   By: WLucienne CapersM.D.   On: 12/05/2015 00:19   Dg Femur, Min 2 Views Right  Result Date: 12/06/2015 CLINICAL DATA:  Proximal right femoral tenderness for the past several days without known injury. EXAM: RIGHT FEMUR 2 VIEWS COMPARISON:  Right hip series of December 04, 2015 FINDINGS: The bones are subjectively adequately mineralized. There is mild narrowing of the hip joint space. The articular surfaces of the femoral head and acetabulum remains smoothly rounded. The observed portions of the right hemipelvis are normal. The femoral neck, intertrochanteric, and subtrochanteric regions of the femur are normal. There is no significant abnormality of the knee. The soft tissues of the thigh are unremarkable. There is vascular calcification in the superficial femoral artery. IMPRESSION: There is no acute or chronic bony  abnormality of the right femur. Very mild osteoarthritic narrowing of the right hip joint space. Electronically Signed   By: David  JMartiniqueM.D.   On: 12/06/2015 10:54    EKG:  12/05/2015 normal sinus rhythm, T wave flattening in the lateral and inferior leads, low QRS voltage in precordial and limb leads  Echo: 12/06/2015  - Left ventricle: The cavity size was normal. There was moderate   focal basal and mild concentric hypertrophy. Systolic function   was normal. The estimated ejection fraction was in the range of   55% to 60%. Wall motion was normal;  there were no regional wall   motion abnormalities. Left ventricular diastolic function   parameters were normal. - Aortic valve: Trileaflet; normal thickness, moderately calcified   leaflets. - Mitral valve: There was trivial regurgitation. - Left atrium: The atrium was moderately dilated. - Pulmonary arteries: PA peak pressure: 47 mm Hg (S).  Cardiac cath: 10/10/2014 1. Mid RCA lesion, 100% stenosed. 2. Mid LAD to Dist LAD lesion, 10% stenosed. The lesion was previously treated with a stent (unknown type) . 3. 3rd Mrg lesion, 90% stenosed. 4. Dist RCA lesion, 100% stenosed. 5. 2nd Diag lesion, 75% stenosed. 6. Dist Cx-1 lesion, 75% stenosed. 7. Dist Cx-2 lesion, 65% stenosed.    Widely patent left anterior descending stent.  Total occlusion of the mid to distal RCA. Distal RCA is supplied by collaterals from the circumflex and LAD.  Highly diseased distal circumflex with a stable eccentric 75% stenosis proximal to the second obtuse marginal. There is diffuse disease beyond the second obtuse marginal and 90% stenosis and a small third obtuse marginal branch. This distal circumflex territory as the source of collaterals to the occluded right coronary. The anatomy is unchanged from 2014.  LV function is not performed. LV end-diastolic pressure is 10 mmHg.  During 5/10 chest discomfort that developed at completion of the case, repeat  angiography revealed identical anatomy to that noted above.   Medical decision making:  Discussed care with the patient and his brother with his permission Reviewed labs and imaging personally Reviewed prior records  ASSESSMENT AND PLAN:  This is a 82 y.o. AA male with known LAD PCI w/DES in 01/2012, CTO of RCA and mild to mod dLCx dz w/last LHC in 09/2014, HTN, HLD, CKD 3b who recently had admission for PNA w/elevated troponin and was evaluated by cards and managed as type II MI from sepsis presented with CP and minimally elevated trop of 0.05.    Principal Problem:   NSTEMI (non-ST elevated myocardial infarction) [facility]) Active Problems:   Essential hypertension, benign   Hyperlipidemia   Chest pain   CAD- S/P PCI 2014   Acute on chronic combined systolic and diastolic heart failure, NYHA class 2 ([place])  NSTEMI Minimally elevated troponin, recurrent CP on imdur 60 mg OD Cycle troponin, consider conservative management over invasive management with risk stratification w/stress due to >20% risk of CIN and patient/family concerns for progression of his CKD  Ordered lipid panel, continue atorvastatin 80 mg, aspirin, added clopidogrel  IV heparin after 9-12 hours of last dose of lovenox  HTN - on hydralazine, not on ACEi which was held 2/2 creatinine elevation during last hospitalization  CKD stage 3b, last known GFR 31 Renal dose meds, avoid contrast if possible, BMP daily  Volume overload 2/2 CKD in the setting of preserved ejection fraction, Acute on chronic diastolic heart failure - ordered BNP (may be falsely high due to CKD, however can be compared with hsi baseline) ordered lasix 40 mg IV x 2 and then dose it based on his response tomorrow  - low sodium diet once his diet is needed, daily weights, strict I/Os  Recent episode of pneumonia, community acquired Continue oral antibiotics (cefuroxime and azithromycin) for 5 days. Start date 12/08/2015   Hyperlipidemia Checking lipid  panel, cont atorvastatin 80 mg daily, cardiac low fat diet  Chest pain, likely ischemic due to prior history However atypical features present and will add PPI to regimen. Some benefit of clopidogrel/brilinta in reducing recurrent NSTEMI/ACS episodes, so will add PPI  Signed,  Flossie Dibble, MD MS 12/11/2015, 1:55 AM

## 2015-12-11 NOTE — Care Management Note (Signed)
Case Management Note  Patient Details  Name: Zaveon Gillen Marrow MRN: [id number] Date of Birth: 1930-11-11  Subjective/Objective:  Pt presented for CP as a transfer from [facility]. Pt lives in [place] and will be d/c to daughter [facility] home in [place].                   Action/Plan: Pt is agreeable to [facility] for disease and medication management. CM did provide agency list and pt chose [facility] for [facility] needs. CM did give referral to [facility] with [facility]. SOC to begin within 24-48 hours post d/c. No other services / DME needed at this time.   Expected Discharge Date:                  Expected Discharge Plan:  [place]  In-House Referral:  NA  Discharge planning Services  CM Consult  Post Acute Care Choice:  Home Health Choice offered to:  Patient, Adult Children  DME Arranged:  N/A DME Agency:  NA  HH Arranged:  RN [place] Agency:  [place]  Status of Service:  Completed, signed off  If discussed at [place] of Stay Meetings, dates discussed:    Additional Comments:  Bethena Roys, RN 12/11/2015, 3:09 PM

## 2015-12-11 NOTE — Progress Notes (Signed)
2D echo showed normal LVF with no wall motion abnormalities.  No further cardiac workup at this time.  Will keep overnight and if feeling better in am then will discharge home

## 2015-12-11 NOTE — Progress Notes (Signed)
Patient Name: Kevin [name] Date of Encounter: 12/11/2015  Primary Cardiologist: Dr. Jesse Sans Problem List     Principal Problem:   NSTEMI (non-ST elevated myocardial infarction) [facility]) Active Problems:   Essential hypertension, benign   Hyperlipidemia   Chest pain   CAD- S/P PCI 2014   Acute on chronic combined systolic and diastolic heart failure, NYHA class 2 ([facility])     Subjective   Feels weak, has productive cough. Denies chest pain.   Inpatient Medications    Scheduled Meds: . amLODipine  10 mg Oral Daily  . aspirin EC  81 mg Oral Daily  . atorvastatin  80 mg Oral QHS  . azithromycin  500 mg Oral Daily  . cefUROXime  500 mg Oral BID  . cholecalciferol  1,000 Units Oral Daily  . clopidogrel  300 mg Oral Once  . [START ON 12/12/2015] clopidogrel  75 mg Oral Daily  . furosemide  40 mg Intravenous Q6H  . hydrALAZINE  100 mg Oral Q8H  . isosorbide mononitrate  60 mg Oral Daily  . metoprolol tartrate  12.5 mg Oral BID  . pantoprazole  40 mg Oral Daily  . sodium bicarbonate  650 mg Oral BID  . sodium chloride flush  3 mL Intravenous Q12H   Continuous Infusions: . heparin     PRN Meds:    Vital Signs    Vitals:   12/11/15 0100 12/11/15 0500  BP: 135/71 (!) 153/67  Pulse: 78 76  Resp: 18   Temp: 98.1 F (36.7 C) 98.7 F (37.1 C)  TempSrc: Oral Oral  SpO2: 99% 99%  Weight: 200 lb 8 oz (90.9 kg)   Height: 5' 6"  (1.676 m)    No intake or output data in the 24 hours ending 12/11/15 0833 Filed Weights   12/11/15 0100  Weight: 200 lb 8 oz (90.9 kg)    Physical Exam   GEN: Well nourished, well developed, in no acute distress.  HEENT: Grossly normal.  Neck: Supple, no JVD, carotid bruits, or masses. Cardiac: RRR, no murmurs, rubs, or gallops. No clubbing, cyanosis, edema.  Radials/DP/PT 2+ and equal bilaterally.  Respiratory:  Respirations regular and unlabored, productive cough, diminished lung sounds in the bases.  GI: Soft, nontender,  nondistended, BS + x 4. MS: no deformity or atrophy. Skin: warm and dry, no rash. Neuro:  Strength and sensation are intact. Psych: AAOx3.  Normal affect.  Labs    Cardiac Enzymes  Recent Labs  12/11/15 0152  TROPONINI 0.05*   Fasting Lipid Panel  Recent Labs  12/11/15 0152  CHOL 123  HDL 29*  LDLCALC 75  TRIG 94  CHOLHDL 4.2   Thyroid Function Tests  Recent Labs  12/11/15 0152  TSH 4.830*    Telemetry     NSR- Personally Reviewed  ECG    NSR - Personally Reviewed  Radiology    No results found.  Cardiac Studies  Transthoracic Echocardiography Study Conclusions  - Left ventricle: The cavity size was normal. There was moderate   focal basal and mild concentric hypertrophy. Systolic function   was normal. The estimated ejection fraction was in the range of   55% to 60%. Wall motion was normal; there were no regional wall   motion abnormalities. Left ventricular diastolic function   parameters were normal. - Aortic valve: Trileaflet; normal thickness, moderately calcified   leaflets. - Mitral valve: There was trivial regurgitation. - Left atrium: The atrium was moderately dilated. - Pulmonary arteries:  PA peak pressure: 47 mm Hg (S).  Impressions:  - The right ventricular systolic pressure was increased consistent   with moderate pulmonary hypertension   Patient Profile     Kevin [name] is a 82 year old male with a past medical history of CAD s/p DES to LAD in 2014, CTO of RCA, last heart cath in 09/2014 showed mild to moderate distal LCx disease, patent stents. Also with history of HTN, HLD, and CKD stage III. Was recently admitted and discharged on 12/08/15 with antibiotics for CAP. Admitted 12/11/15 with chest pain.   Assessment & Plan    1. Chest pain with history of CAD: Presented with chest pain that he described as burning sensation. His usual angina is more of a dull, squeezing sensation. Troponin not consistent with a ACS trend. He has  had a persistent cough and recent diagnosis of CAP. Will repeat chest x ray today as last Xray suggested follow up.   I don't think he needs an ischemic evaluation with stress testing at this time, I think his symptoms are more related to his CAP. His labs are still pending.   2. CAP: Continue azithromycin and cefuroxime. QTC within normal range.   3. HTN: well controlled. Continue current regimen.   4. CKD stage III: Creatinine stable, eGFR 36.   Signed, Arbutus Leas, NP  12/11/2015, 8:33 AM

## 2015-12-12 ENCOUNTER — Encounter (HOSPITAL_COMMUNITY): Payer: Self-pay | Admitting: General Practice

## 2015-12-12 DIAGNOSIS — E7800 Pure hypercholesterolemia, unspecified: Secondary | ICD-10-CM

## 2015-12-12 LAB — HEMOGLOBIN A1C
HEMOGLOBIN A1C: 5.5 % (ref 4.8–5.6)
Mean Plasma Glucose: 111 mg/dL

## 2015-12-12 NOTE — Progress Notes (Signed)
Chart briefly reviewed. Interviewed patient and daughter at bedside and examined patient. He states that he is feeling much better and denies chest pain or dyspnea. Has dry cough. Intermittent chills without fevers.  Pleasant elderly male lying comfortably supine in bed without distress. Vital signs stable.  RS-clear to auscultation without increased work of breathing. CVS-S1 and S2 heard, RRR. No JVD, murmurs or pedal edema. Abdomen-nondistended, soft and nontender. Normal bowel sounds heard. CNS-alert and oriented 3 without focal neurological deficits. Extremities-symmetric normal appearing power.  Assessment and plan 82 year old male with PMH of CAD status post cath with DES, chronic kidney disease, claudication, HLD, HTN, tobacco abuse had presented with complaints of chest pain concerning for CAD/ACS. He was admitted by cardiology with initial diagnosis of NSTEMI and acute on chronic combined systolic and diastolic CHF. Troponins were trended and patient was diuresed. After further evaluation, cardiology felt that his chest pain was not related to ACS and TRH were consulted for chest pain with history of recent admission for community-acquired pneumonia. At the time of hospitalist evaluation 12/11/15, patient had been chest pain-free and felt back to his baseline. Mild swelling that he had in his legs and hands had significantly improved. He denied any worsening of respiratory status since beginning antibiotics during previous admission for pneumonia. Chest pain was felt to be due to recent pneumonia and possible pleurisy versus GI. Chest pain however has resolved without recurrence. He does have nonproductive cough and advised patient and family that this may linger on sometimes even for a couple of weeks. Repeat chest x-ray shows resolution of pneumonia which is encouraging. OTC symptomatic treatment for cough such as Robitussin was advised. Patient has completed course of antibiotics for recent  pneumonia. Regarding his chronic kidney disease, creatinine progressively improving. He had creatinine of 2.39 on 12/04/15 which has decreased to 1.7 on 11/14. Baseline creatinine may be in the 1.5-1.8 range. This can be followed by periodic labs during outpatient follow-up. Anemia is stable. Patient was seen along with Dr. Radford Pax in the room. Cardiology feels that an ischemic evaluation is not indicated as his chest pain was not consistent with his previous angina, no EKG changes and had stable moderate CAD a year ago. 2-D echo showed no wall motion abnormalities and had normal EF. He had mildly abnormal TSH at 4.830 but clinically appears euthyroid. May consider repeating full TFTs as outpatient in 4-6 weeks. Patient discharged by cardiology service in stable condition.  Vernell Leep, MD, FACP, FHM. [facility] Pager [phone]  If 7PM-7AM, please contact night-coverage [email or web address] Password [email or web address] 12/12/2015, 6:50 PM

## 2015-12-12 NOTE — Discharge Summary (Signed)
Discharge Summary    Patient ID: Kevin [name],  MRN: [id number], DOB/AGE: [birth year] 82 y.o.  Admit date: 12/11/2015 Discharge date: 12/12/2015  Primary Care Provider: Glenda Chroman Primary Cardiologist: Dr. Domenic Polite   Discharge Diagnoses    Principal Problem:   NSTEMI (non-ST elevated myocardial infarction) [facility]) Active Problems:   Essential hypertension, benign   Hyperlipidemia   Chest pain   CAD- S/P PCI 2014   Acute on chronic combined systolic and diastolic heart failure, NYHA class 2 ([place])   Community acquired pneumonia   Physical deconditioning   Allergies No Known Allergies  Diagnostic Studies/Procedures  Transthoracic Echocardiography  12/11/15 Study Conclusions  - Left ventricle: The cavity size was normal. Wall thickness was   increased in a pattern of mild LVH. Systolic function was normal.   The estimated ejection fraction was in the range of 55% to 60%.   Wall motion was normal; there were no regional wall motion   abnormalities. Doppler parameters are consistent with abnormal   left ventricular relaxation (grade 1 diastolic dysfunction). - Aortic valve: There was trivial regurgitation. - Mitral valve: There was mild regurgitation. - Left atrium: The atrium was moderately dilated. - Pulmonary arteries: Systolic pressure was mildly increased. PA   peak pressure: 32 mm Hg (S).  Impressions:  - Normal LV systolic function; grade 1 diastolic dysfunction; trace   AI; mild MR; moderate LAE; trace TR with mildly elevated   pulmonary pressure.  _____________   History of Present Illness     Mr. Kevin [name] is a 82 year old male with a past medical history of CAD s/p DES to LAD in 2014, CTO of RCA, last heart cath in 09/2014 showed mild to moderate distal left circumflex disease. Also has a history of HTN, HLD, and CKD stage III. He was recently admitted from 12/04/15-12/08/15 with CAP, treated with antibiotics. Also had chest pain that admission, with mild  troponin increase that was not consistent with an ACS pattern.   He represented to the ED on 12/11/15 with chest pain, not his usual angina. Troponin was again elevated with a flat trend.  Hospital Course  [facility] hospitalists were consulted for management of PNA. His chest X ray showed resolved PNA, although he does continue to have a cough.   He was diuresed with lasix x 1 with good response. He denied any further chest pain. It was felt that an ischemic evaluation was not needed as his chest pain was not consistent with his previous angina, he had no EKG changes, and he had stable moderate CAD a year ago.   Repeat echo was done and there were no wall motion abnormalities, he had a normal EF.   No changes were made with his medications. He completed his course of antibiotics (which were started prior to admission).   He was seen today by Dr. Radford Pax and deemed suitable for discharge.  _____________  Discharge Vitals Blood pressure 125/63, pulse 69, temperature 97.7 F (36.5 C), temperature source Oral, resp. rate 18, height 5\' 6"  (1.676 m), weight 194 lb (88 kg), SpO2 94 %.  Filed Weights   12/11/15 0100 12/12/15 0500  Weight: 200 lb 8 oz (90.9 kg) 194 lb (88 kg)   Physical Exam   GEN: Well nourished, well developed, in no acute distress.  HEENT: Grossly normal.  Neck: Supple, no JVD, carotid bruits, or masses. Cardiac: RRR, no murmurs, rubs, or gallops. No clubbing, cyanosis, edema.  Radials/DP/PT 2+ and equal bilaterally.  Respiratory:  Respirations regular and unlabored, productive cough, diminished lung sounds in the bases.  GI: Soft, nontender, nondistended, BS + x 4. MS: no deformity or atrophy. Skin: warm and dry, no rash. Neuro:  Strength and sensation are intact. Psych: AAOx3.  Normal affect.   Labs & Radiologic Studies     CBC  Recent Labs  12/11/15 0758 12/11/15 1303  WBC 8.8 10.3  HGB 9.3* 11.5*  HCT 29.3* 36.1*  MCV 94.2 94.3  PLT 128* 751   Basic  Metabolic Panel  Recent Labs  12/11/15 0758 12/11/15 1303  NA 141  --   K 4.1  --   CL 112*  --   CO2 21*  --   GLUCOSE 86  --   BUN 15  --   CREATININE 1.77* 1.86*  CALCIUM 8.5*  --    Liver Function Tests  Recent Labs  12/11/15 0758  AST 52*  ALT 101*  ALKPHOS 106  BILITOT 0.5  PROT 5.7*  ALBUMIN 2.9*   Cardiac Enzymes  Recent Labs  12/11/15 0152 12/11/15 0758 12/11/15 1303  TROPONINI 0.05* 0.05* 0.10*   Hemoglobin A1C  Recent Labs  12/11/15 0152  HGBA1C 5.5   Fasting Lipid Panel  Recent Labs  12/11/15 0152  CHOL 123  HDL 29*  LDLCALC 75  TRIG 94  CHOLHDL 4.2   Thyroid Function Tests  Recent Labs  12/11/15 0152  TSH 4.830*    Dg Chest 2 View  Result Date: 12/11/2015 CLINICAL DATA:  82 year old male with history of cough. Evaluate for pneumonia. No fever. EXAM: CHEST  2 VIEW COMPARISON:  Chest x-ray 12/10/2015. FINDINGS: Lung volumes are normal. No consolidative airspace disease. No pleural effusions. No pneumothorax. No pulmonary nodule or mass noted. Pulmonary vasculature and the cardiomediastinal silhouette are within normal limits. Atherosclerotic calcifications in the thoracic aorta. Probable coronary artery stent noted in the region of the left anterior descending coronary artery. Numerous surgical clips project over the epigastric region. IMPRESSION: 1. No radiographic evidence of acute cardiopulmonary disease. 2. Aortic atherosclerosis. Electronically Signed   By: Vinnie Langton M.D.   On: 12/11/2015 09:41   Dg Chest 2 View  Result Date: 12/06/2015 CLINICAL DATA:  Fever and shortness of breath today. History of coronary artery disease with stent placement. Former smoker. EXAM: CHEST  2 VIEW COMPARISON:  Portable chest x-ray of August 03, 2015 FINDINGS: The lungs are well-expanded. There is increased density at both lung bases. There is no pleural effusion or pneumothorax. The cardiac silhouette is mildly enlarged. The central pulmonary  vascularity is prominent but stable. The mediastinum is normal in width. The bony thorax is unremarkable. IMPRESSION: Increased density at both lung bases consistent with atelectasis or developing pneumonia. Mild cardiomegaly without pulmonary vascular congestion. Followup PA and lateral chest X-ray is recommended in 3-4 weeks following trial of antibiotic therapy to ensure resolution and exclude underlying malignancy. Electronically Signed   By: David  Martinique M.D.   On: 12/06/2015 10:53   Nm Hepatobiliary Liver Func  Result Date: 12/05/2015 CLINICAL DATA:  Right upper quadrant abdominal pain. Symptoms for 2 days EXAM: NUCLEAR MEDICINE HEPATOBILIARY IMAGING TECHNIQUE: Sequential images of the abdomen were obtained out to 60 minutes following intravenous administration of radiopharmaceutical. RADIOPHARMACEUTICALS:  5.1 mCi Tc-49m  Choletec IV COMPARISON:  None. FINDINGS: Prompt uptake and biliary excretion of activity by the liver is seen. Gallbladder activity is visualized, consistent with patency of cystic duct. Biliary activity passes into small bowel, consistent with patent common bile  duct. IMPRESSION: Normal scan.  Patent cystic and common bile ducts. Electronically Signed   By: Monte Fantasia M.D.   On: 12/05/2015 14:48   US Renal  Result Date: 12/06/2015 CLINICAL DATA:  Acute renal injury EXAM: RENAL / URINARY TRACT ULTRASOUND COMPLETE COMPARISON:  02/09/2012 ultrasound of the kidneys FINDINGS: Right Kidney: Length: 10 cm. Echogenicity within normal limits. No mass or hydronephrosis visualized. There is an anechoic simple appearing 1.2 x 1.2 x 1.4 cm lower pole renal cyst. Left Kidney: Length: 9.4 cm. Echogenicity within normal limits. No mass or hydronephrosis visualized. There is an exophytic well-circumscribed 5.6 x 3.8 x 5 cm interpolar anechoic simple cyst, previously measuring 5.1 x 4.1 x 3.7 cm. Adjacent to this is a 1 x 0.9 x 0.5 cm exophytic cyst too small to further characterize. Bladder:  Appears normal for degree of bladder distention. IMPRESSION: No obstructive uropathy or significant loss of cortical -medullary distinction. Bilateral renal cysts as above described. Electronically Signed   By: Ashley Royalty M.D.   On: 12/06/2015 21:48   US Abdomen Limited  Result Date: 12/05/2015 CLINICAL DATA:  Elevated liver function studies. Right upper quadrant pain for 2 days. History of hypertension and chronic kidney disease. EXAM: US ABDOMEN LIMITED - RIGHT UPPER QUADRANT COMPARISON:  None. FINDINGS: Gallbladder: Cholelithiasis with several stones in the gallbladder and extending to the gallbladder neck. Largest stone measures about 1 cm diameter. Mild gallbladder wall thickening at 3.8 mm maximum. Mild pericholecystic edema. Murphy's sign is negative. Common bile duct: Diameter: 3.5 mm, normal Liver: Mild diffusely increased liver parenchymal echotexture suggesting fatty infiltration. No focal lesions identified. IMPRESSION: Cholelithiasis with mild gallbladder wall thickening and edema. Although Murphy's sign is negative, changes may indicate acute cholecystitis in the appropriate clinical setting. No bile duct dilatation. Electronically Signed   By: Lucienne Capers M.D.   On: 12/05/2015 00:19   Dg Femur, Min 2 Views Right  Result Date: 12/06/2015 CLINICAL DATA:  Proximal right femoral tenderness for the past several days without known injury. EXAM: RIGHT FEMUR 2 VIEWS COMPARISON:  Right hip series of December 04, 2015 FINDINGS: The bones are subjectively adequately mineralized. There is mild narrowing of the hip joint space. The articular surfaces of the femoral head and acetabulum remains smoothly rounded. The observed portions of the right hemipelvis are normal. The femoral neck, intertrochanteric, and subtrochanteric regions of the femur are normal. There is no significant abnormality of the knee. The soft tissues of the thigh are unremarkable. There is vascular calcification in the superficial  femoral artery. IMPRESSION: There is no acute or chronic bony abnormality of the right femur. Very mild osteoarthritic narrowing of the right hip joint space. Electronically Signed   By: David  Martinique M.D.   On: 12/06/2015 10:54    Disposition   Pt is being discharged home today in good condition.  Follow-up Plans & Appointments    Follow-up Information    [facility] Follow up.   Why:  Registered Nurse Contact information: [address] [place] [address] [phone]        Rozann Lesches, MD Follow up on 02/07/2016.   Specialty:  Cardiology Why:  at 10:40am for follow up with cardiology.  Contact information: [address] [place] [address] [phone]            Discharge Medications   Current Discharge Medication List    CONTINUE these medications which have NOT CHANGED   Details  amLODipine (NORVASC) 10 MG tablet  Take 10 mg by mouth daily.    aspirin EC 81 MG tablet Take 81 mg by mouth daily.    atorvastatin (LIPITOR) 80 MG tablet TAKE ONE TABLET BY MOUTH AT BEDTIME Qty: 30 tablet, Refills: 3    Cholecalciferol (VITAMIN D-3) 1000 UNITS CAPS Take 1,000 Units by mouth daily.     guaiFENesin (MUCINEX) 600 MG 12 hr tablet Take 1 tablet (600 mg total) by mouth 2 (two) times daily. Qty: 20 tablet, Refills: 0    hydrALAZINE (APRESOLINE) 100 MG tablet TAKE ONE TABLET BY MOUTH THREE TIMES DAILY. Qty: 90 tablet, Refills: 5    isosorbide mononitrate (IMDUR) 60 MG 24 hr tablet TAKE ONE TABLET BY MOUTH DAILY. Qty: 30 tablet, Refills: 6    metoprolol tartrate (LOPRESSOR) 25 MG tablet Take 0.5 tablets (12.5 mg total) by mouth 2 (two) times daily. Qty: 90 tablet, Refills: 3    Polysaccharide Iron Complex (POLY-IRON 150 PO) Take 150 mg by mouth daily.    sodium bicarbonate 650 MG tablet Take 1 tablet (650 mg total) by mouth 2 (two) times daily. Qty: 60 tablet, Refills: 0    nitroGLYCERIN (NITROSTAT) 0.4 MG SL tablet Place 1 tablet  (0.4 mg total) under the tongue every 5 (five) minutes as needed for chest pain (up to 3 doses). Qty: 25 tablet, Refills: 4   Associated Diagnoses: Coronary artery disease involving native coronary artery of native heart with angina pectoris (HCC)      STOP taking these medications     azithromycin (ZITHROMAX) 500 MG tablet      cefdinir (OMNICEF) 300 MG capsule      lisinopril (PRINIVIL,ZESTRIL) 5 MG tablet             Outstanding Labs/Studies     Duration of Discharge Encounter   Greater than 30 minutes including physician time.  Signed, Arbutus Leas NP 12/12/2015, 9:24 AM

## 2015-12-13 ENCOUNTER — Telehealth: Payer: Self-pay | Admitting: Cardiology

## 2015-12-13 DIAGNOSIS — E785 Hyperlipidemia, unspecified: Secondary | ICD-10-CM | POA: Diagnosis not present

## 2015-12-13 DIAGNOSIS — I214 Non-ST elevation (NSTEMI) myocardial infarction: Secondary | ICD-10-CM | POA: Diagnosis not present

## 2015-12-13 DIAGNOSIS — Z955 Presence of coronary angioplasty implant and graft: Secondary | ICD-10-CM | POA: Diagnosis not present

## 2015-12-13 DIAGNOSIS — D649 Anemia, unspecified: Secondary | ICD-10-CM | POA: Diagnosis not present

## 2015-12-13 DIAGNOSIS — Z87891 Personal history of nicotine dependence: Secondary | ICD-10-CM | POA: Diagnosis not present

## 2015-12-13 DIAGNOSIS — J189 Pneumonia, unspecified organism: Secondary | ICD-10-CM | POA: Diagnosis not present

## 2015-12-13 DIAGNOSIS — M1990 Unspecified osteoarthritis, unspecified site: Secondary | ICD-10-CM | POA: Diagnosis not present

## 2015-12-13 DIAGNOSIS — I2510 Atherosclerotic heart disease of native coronary artery without angina pectoris: Secondary | ICD-10-CM | POA: Diagnosis not present

## 2015-12-13 DIAGNOSIS — N183 Chronic kidney disease, stage 3 (moderate): Secondary | ICD-10-CM | POA: Diagnosis not present

## 2015-12-13 DIAGNOSIS — I130 Hypertensive heart and chronic kidney disease with heart failure and stage 1 through stage 4 chronic kidney disease, or unspecified chronic kidney disease: Secondary | ICD-10-CM | POA: Diagnosis not present

## 2015-12-13 DIAGNOSIS — Z7982 Long term (current) use of aspirin: Secondary | ICD-10-CM | POA: Diagnosis not present

## 2015-12-13 DIAGNOSIS — I5033 Acute on chronic diastolic (congestive) heart failure: Secondary | ICD-10-CM | POA: Diagnosis not present

## 2015-12-13 NOTE — Telephone Encounter (Signed)
Kevin [name] was discharged from [facility] on 11-15.  Family is concerned about Kevin [name] being put back on Lipitor.  Home Health Nurse is also at the home.

## 2015-12-14 MED ORDER — ATORVASTATIN CALCIUM 80 MG PO TABS: 80.0000 mg | ORAL_TABLET | Freq: Every evening | ORAL | 6 refills | Status: DC

## 2015-12-14 NOTE — Telephone Encounter (Signed)
Patient stated that he had not been taking the Lipitor, but they started giving it to him again at this recent hospitalization.  Stated he ran out of refills & thought he was supposed to stop.  Lipitor was on his med list for July at last OV, but did not tell the nurse he was not taking at that time.  Lipitor refill sent to [facility].  Also, reminded him to keep his post hospital visit for 12/28/15 with Dr. Domenic Polite in our [place] office.  He can discuss this medication further at that time as well.  Patient verbalized understanding.

## 2015-12-18 DIAGNOSIS — I2510 Atherosclerotic heart disease of native coronary artery without angina pectoris: Secondary | ICD-10-CM | POA: Diagnosis not present

## 2015-12-18 DIAGNOSIS — I130 Hypertensive heart and chronic kidney disease with heart failure and stage 1 through stage 4 chronic kidney disease, or unspecified chronic kidney disease: Secondary | ICD-10-CM | POA: Diagnosis not present

## 2015-12-18 DIAGNOSIS — N183 Chronic kidney disease, stage 3 (moderate): Secondary | ICD-10-CM | POA: Diagnosis not present

## 2015-12-18 DIAGNOSIS — I5033 Acute on chronic diastolic (congestive) heart failure: Secondary | ICD-10-CM | POA: Diagnosis not present

## 2015-12-18 DIAGNOSIS — J189 Pneumonia, unspecified organism: Secondary | ICD-10-CM | POA: Diagnosis not present

## 2015-12-18 DIAGNOSIS — I214 Non-ST elevation (NSTEMI) myocardial infarction: Secondary | ICD-10-CM | POA: Diagnosis not present

## 2015-12-21 DIAGNOSIS — I2510 Atherosclerotic heart disease of native coronary artery without angina pectoris: Secondary | ICD-10-CM | POA: Diagnosis not present

## 2015-12-21 DIAGNOSIS — I214 Non-ST elevation (NSTEMI) myocardial infarction: Secondary | ICD-10-CM | POA: Diagnosis not present

## 2015-12-21 DIAGNOSIS — I5033 Acute on chronic diastolic (congestive) heart failure: Secondary | ICD-10-CM | POA: Diagnosis not present

## 2015-12-21 DIAGNOSIS — N183 Chronic kidney disease, stage 3 (moderate): Secondary | ICD-10-CM | POA: Diagnosis not present

## 2015-12-21 DIAGNOSIS — J189 Pneumonia, unspecified organism: Secondary | ICD-10-CM | POA: Diagnosis not present

## 2015-12-21 DIAGNOSIS — I130 Hypertensive heart and chronic kidney disease with heart failure and stage 1 through stage 4 chronic kidney disease, or unspecified chronic kidney disease: Secondary | ICD-10-CM | POA: Diagnosis not present

## 2015-12-24 DIAGNOSIS — H6981 Other specified disorders of Eustachian tube, right ear: Secondary | ICD-10-CM | POA: Diagnosis not present

## 2015-12-24 DIAGNOSIS — N183 Chronic kidney disease, stage 3 (moderate): Secondary | ICD-10-CM | POA: Diagnosis not present

## 2015-12-24 DIAGNOSIS — I509 Heart failure, unspecified: Secondary | ICD-10-CM | POA: Diagnosis not present

## 2015-12-24 DIAGNOSIS — J189 Pneumonia, unspecified organism: Secondary | ICD-10-CM | POA: Diagnosis not present

## 2015-12-26 DIAGNOSIS — J189 Pneumonia, unspecified organism: Secondary | ICD-10-CM | POA: Diagnosis not present

## 2015-12-26 DIAGNOSIS — N183 Chronic kidney disease, stage 3 (moderate): Secondary | ICD-10-CM | POA: Diagnosis not present

## 2015-12-26 DIAGNOSIS — I130 Hypertensive heart and chronic kidney disease with heart failure and stage 1 through stage 4 chronic kidney disease, or unspecified chronic kidney disease: Secondary | ICD-10-CM | POA: Diagnosis not present

## 2015-12-26 DIAGNOSIS — I214 Non-ST elevation (NSTEMI) myocardial infarction: Secondary | ICD-10-CM | POA: Diagnosis not present

## 2015-12-26 DIAGNOSIS — I5033 Acute on chronic diastolic (congestive) heart failure: Secondary | ICD-10-CM | POA: Diagnosis not present

## 2015-12-26 DIAGNOSIS — I2510 Atherosclerotic heart disease of native coronary artery without angina pectoris: Secondary | ICD-10-CM | POA: Diagnosis not present

## 2015-12-27 NOTE — Progress Notes (Signed)
Cardiology Office Note  Date: 12/28/2015   ID: Kevin [name], DOB 1930-02-12, MRN [id number]  PCP: Glenda Chroman, MD  Primary Cardiologist: Rozann Lesches, MD   Chief Complaint  Patient presents with  . Coronary Artery Disease    History of Present Illness: Kevin [name] is an 82 y.o. male last seen in July. Interval records reviewed, he was recently admitted to the hospital with pneumonia, also associated demand ischemia with mild increase in troponin I up to 0.34. He was treated medically.He presents today for a follow-up visit, states that he feels much better but is still gaining his strength back. He has home health nursing and is in his own home at this time, having stayed with his daughter initially when he was discharged. He has not had any angina symptoms.  Follow-up echocardiogram is outlined below. LVEF remains normal range, no wall motion abnormalities.  Past Medical History:  Diagnosis Date  . Arthritis   . CAD (coronary artery disease)    a. s/p DES to LAD (2014)  b. s/p LHC (09/2014) with patent LAD stent and stable dz from previous cath   . Chronic anemia   . CKD (chronic kidney disease), stage IV ([place])   . Claudication ([place])    R leg with bilateral femoral bruits.  . Dyslipidemia   . Essential hypertension, benign   . Myocardial infarction    nstemi  . Tobacco abuse     Past Surgical History:  Procedure Laterality Date  . Bilateral carpal tunnel release Bilateral   . CARDIAC CATHETERIZATION N/A 10/10/2014   Procedure: Left Heart Cath and Coronary Angiography;  Surgeon: Belva Crome, MD;  Location: [facility];  Service: Cardiovascular;  Laterality: N/A;  . LEFT HEART CATHETERIZATION WITH CORONARY ANGIOGRAM N/A 02/09/2012   Procedure: LEFT HEART CATHETERIZATION WITH CORONARY ANGIOGRAM;  Surgeon: Peter M Martinique, MD; left main 10%, LAD 80%/95%, D1 patent, CFX 70% at OM 2, dCFX 90%, RCA 100% with L-R collaterals    . PERCUTANEOUS CORONARY STENT  INTERVENTION (PCI-S) N/A 02/11/2012   Procedure: PERCUTANEOUS CORONARY STENT INTERVENTION (PCI-S);  Surgeon: Burnell Blanks, MD; 3.0 x 28 mm Promus Premier DES to the mid LAD   . REPAIR OF PERFORATED ULCER      Current Outpatient Prescriptions  Medication Sig Dispense Refill  . amLODipine (NORVASC) 10 MG tablet Take 10 mg by mouth daily.    Marland Kitchen aspirin EC 81 MG tablet Take 81 mg by mouth daily.    Marland Kitchen atorvastatin (LIPITOR) 80 MG tablet Take 1 tablet (80 mg total) by mouth every evening. 30 tablet 6  . Cholecalciferol (VITAMIN D-3) 1000 UNITS CAPS Take 1,000 Units by mouth daily.     Marland Kitchen guaiFENesin (MUCINEX) 600 MG 12 hr tablet Take 600 mg by mouth 2 (two) times daily.    . hydrALAZINE (APRESOLINE) 100 MG tablet TAKE ONE TABLET BY MOUTH THREE TIMES DAILY. (Patient taking differently: TAKE 100 mg BY MOUTH twice TIMES DAILY.) 90 tablet 5  . isosorbide mononitrate (IMDUR) 60 MG 24 hr tablet TAKE ONE TABLET BY MOUTH DAILY. (Patient taking differently: TAKE 60 mg BY MOUTH DAILY.) 30 tablet 6  . loratadine (CLARITIN) 10 MG tablet Take 10 mg by mouth daily.  2  . metoprolol tartrate (LOPRESSOR) 25 MG tablet Take 0.5 tablets (12.5 mg total) by mouth 2 (two) times daily. 90 tablet 3  . nitroGLYCERIN (NITROSTAT) 0.4 MG SL tablet Place 1 tablet (0.4 mg total) under the tongue every  5 (five) minutes as needed for chest pain (up to 3 doses). 25 tablet 4  . Polysaccharide Iron Complex (POLY-IRON 150 PO) Take 150 mg by mouth daily.    . sodium bicarbonate 650 MG tablet Take 1 tablet (650 mg total) by mouth 2 (two) times daily. 60 tablet 0   No current facility-administered medications for this visit.    Allergies:  Patient has no known allergies.   Social History: The patient  reports that he quit smoking about 3 years ago. His smoking use included Cigarettes. He started smoking about 47 years ago. He has a 22.50 pack-year smoking history. He has never used smokeless tobacco. He reports that he does not  drink alcohol or use drugs.   ROS:  Please see the history of present illness. Otherwise, complete review of systems is positive for sinus congestion.  All other systems are reviewed and negative.   Physical Exam: VS:  BP (!) 142/64   Pulse 66   Ht 5\' 6"  (1.676 m)   Wt 196 lb (88.9 kg)   SpO2 96%   BMI 31.64 kg/m , BMI Body mass index is 31.64 kg/m.  Wt Readings from Last 3 Encounters:  12/28/15 196 lb (88.9 kg)  12/12/15 194 lb (88 kg)  12/08/15 204 lb 6.4 oz (92.7 kg)    Elderly male, appears comfortable at rest.  HEENT: Conjunctiva and lids normal, oropharynx clear.  Neck: Supple, no elevated JVP or carotid bruits, no thyromegaly.  Lungs: Few scattered upper airway rhonchi, nonlabored breathing at rest.  Cardiac: Regular rate and rhythm, no S3 or significant systolic murmur, no pericardial rub.  Abdomen: Soft, nontender, bowel sounds present.  Extremities: Mild edema in his hands, distal pulses 1+. Skin: Warm and dry. Musculoskeletal: No kyphosis. Neuropsychiatric: Alert and oriented 3, affect appropriate.  ECG: I personally reviewed the tracing from 12/05/2015 which showed sinus rhythm with short PR interval and nonspecific T-wave changes.  Recent Labwork: 12/11/2015: ALT 101; AST 52; B Natriuretic Peptide 298.7; BUN 15; Creatinine, Ser 1.86; Hemoglobin 11.5; Platelets 165; Potassium 4.1; Sodium 141; TSH 4.830     Component Value Date/Time   CHOL 123 12/11/2015 0152   TRIG 94 12/11/2015 0152   HDL 29 (L) 12/11/2015 0152   CHOLHDL 4.2 12/11/2015 0152   VLDL 19 12/11/2015 0152   LDLCALC 75 12/11/2015 0152    Other Studies Reviewed Today:  Echocardiogram 12/11/2015: Study Conclusions  - Left ventricle: The cavity size was normal. Wall thickness was   increased in a pattern of mild LVH. Systolic function was normal.   The estimated ejection fraction was in the range of 55% to 60%.   Wall motion was normal; there were no regional wall motion   abnormalities.  Doppler parameters are consistent with abnormal   left ventricular relaxation (grade 1 diastolic dysfunction). - Aortic valve: There was trivial regurgitation. - Mitral valve: There was mild regurgitation. - Left atrium: The atrium was moderately dilated. - Pulmonary arteries: Systolic pressure was mildly increased. PA   peak pressure: 32 mm Hg (S).  Impressions:  - Normal LV systolic function; grade 1 diastolic dysfunction; trace   AI; mild MR; moderate LAE; trace TR with mildly elevated   pulmonary pressure.  Cardiac catheterization 10/10/2014: 1. Mid RCA lesion, 100% stenosed. 2. Mid LAD to Dist LAD lesion, 10% stenosed. The lesion was previously treated with a stent (unknown type) . 3. 3rd Mrg lesion, 90% stenosed. 4. Dist RCA lesion, 100% stenosed. 5. 2nd Diag lesion, 75% stenosed.  6. Dist Cx-1 lesion, 75% stenosed. 7. Dist Cx-2 lesion, 65% stenosed.    Widely patent left anterior descending stent.  Total occlusion of the mid to distal RCA. Distal RCA is supplied by collaterals from the circumflex and LAD.  Highly diseased distal circumflex with a stable eccentric 75% stenosis proximal to the second obtuse marginal. There is diffuse disease beyond the second obtuse marginal and 90% stenosis and a small third obtuse marginal branch. This distal circumflex territory as the source of collaterals to the occluded right coronary. The anatomy is unchanged from 2014.  LV function is not performed. LV end-diastolic pressure is 10 mmHg.  During 5/10 chest discomfort that developed at completion of the case, repeat angiography revealed identical anatomy to that noted above.  Assessment and Plan:  1. Demand ischemia noted during recent hospital stay with pneumonia. He is not reporting any angina on medical therapy and LVEF remains normal by follow-up echocardiogram. Plan to continue current cardiac medical therapy.  2. Multivessel CAD status post DES to the LAD in 2014. Mid to distal  RCA is occluded and fed by left to right collaterals, and there is severe distal circumflex disease that is managed medically.  3. CKD stage 3-4, creatinine 1.8 most recently.  4. Essential hypertension, no changes made to current regimen.  5. Hyperlipidemia, on Lipitor. Recent LDL 75.  Current medicines were reviewed with the patient today.  Disposition: Follow-up in 6 months.  Signed, Satira Sark, MD, [facility] 12/28/2015 9:11 AM     [facility] [address] [place], [place] [address] Phone: [phone]; Fax: [phone]

## 2015-12-28 ENCOUNTER — Encounter: Payer: Self-pay | Admitting: Cardiology

## 2015-12-28 ENCOUNTER — Ambulatory Visit (INDEPENDENT_AMBULATORY_CARE_PROVIDER_SITE_OTHER): Payer: Medicare Other | Admitting: Cardiology

## 2015-12-28 VITALS — BP 142/64 | HR 66 | Ht 66.0 in | Wt 196.0 lb

## 2015-12-28 DIAGNOSIS — I209 Angina pectoris, unspecified: Secondary | ICD-10-CM

## 2015-12-28 DIAGNOSIS — N183 Chronic kidney disease, stage 3 unspecified: Secondary | ICD-10-CM

## 2015-12-28 DIAGNOSIS — I10 Essential (primary) hypertension: Secondary | ICD-10-CM

## 2015-12-28 DIAGNOSIS — I25119 Atherosclerotic heart disease of native coronary artery with unspecified angina pectoris: Secondary | ICD-10-CM

## 2015-12-28 DIAGNOSIS — E782 Mixed hyperlipidemia: Secondary | ICD-10-CM | POA: Diagnosis not present

## 2015-12-28 NOTE — Patient Instructions (Signed)
Your physician wants you to follow-up in: 6 months Dr McDowell You will receive a reminder letter in the mail two months in advance. If you don't receive a letter, please call our office to schedule the follow-up appointment.  Your physician recommends that you continue on your current medications as directed. Please refer to the Current Medication list given to you today.    If you need a refill on your cardiac medications before your next appointment, please call your pharmacy.       Thank you for choosing [facility] !         

## 2016-01-08 DIAGNOSIS — I214 Non-ST elevation (NSTEMI) myocardial infarction: Secondary | ICD-10-CM | POA: Diagnosis not present

## 2016-01-08 DIAGNOSIS — I2510 Atherosclerotic heart disease of native coronary artery without angina pectoris: Secondary | ICD-10-CM | POA: Diagnosis not present

## 2016-01-08 DIAGNOSIS — I5033 Acute on chronic diastolic (congestive) heart failure: Secondary | ICD-10-CM | POA: Diagnosis not present

## 2016-01-08 DIAGNOSIS — I130 Hypertensive heart and chronic kidney disease with heart failure and stage 1 through stage 4 chronic kidney disease, or unspecified chronic kidney disease: Secondary | ICD-10-CM | POA: Diagnosis not present

## 2016-01-08 DIAGNOSIS — J189 Pneumonia, unspecified organism: Secondary | ICD-10-CM | POA: Diagnosis not present

## 2016-01-08 DIAGNOSIS — N183 Chronic kidney disease, stage 3 (moderate): Secondary | ICD-10-CM | POA: Diagnosis not present

## 2016-01-11 ENCOUNTER — Other Ambulatory Visit: Payer: Self-pay | Admitting: Cardiology

## 2016-01-17 ENCOUNTER — Other Ambulatory Visit: Payer: Self-pay | Admitting: Cardiology

## 2016-01-22 DIAGNOSIS — N183 Chronic kidney disease, stage 3 (moderate): Secondary | ICD-10-CM | POA: Diagnosis not present

## 2016-01-22 DIAGNOSIS — J189 Pneumonia, unspecified organism: Secondary | ICD-10-CM | POA: Diagnosis not present

## 2016-01-22 DIAGNOSIS — I2510 Atherosclerotic heart disease of native coronary artery without angina pectoris: Secondary | ICD-10-CM | POA: Diagnosis not present

## 2016-01-22 DIAGNOSIS — I5033 Acute on chronic diastolic (congestive) heart failure: Secondary | ICD-10-CM | POA: Diagnosis not present

## 2016-01-22 DIAGNOSIS — I214 Non-ST elevation (NSTEMI) myocardial infarction: Secondary | ICD-10-CM | POA: Diagnosis not present

## 2016-01-22 DIAGNOSIS — I130 Hypertensive heart and chronic kidney disease with heart failure and stage 1 through stage 4 chronic kidney disease, or unspecified chronic kidney disease: Secondary | ICD-10-CM | POA: Diagnosis not present

## 2016-02-01 DIAGNOSIS — Z87891 Personal history of nicotine dependence: Secondary | ICD-10-CM | POA: Diagnosis not present

## 2016-02-01 DIAGNOSIS — Z299 Encounter for prophylactic measures, unspecified: Secondary | ICD-10-CM | POA: Diagnosis not present

## 2016-02-01 DIAGNOSIS — M7989 Other specified soft tissue disorders: Secondary | ICD-10-CM | POA: Diagnosis not present

## 2016-02-01 DIAGNOSIS — I509 Heart failure, unspecified: Secondary | ICD-10-CM | POA: Diagnosis not present

## 2016-02-01 DIAGNOSIS — J069 Acute upper respiratory infection, unspecified: Secondary | ICD-10-CM | POA: Diagnosis not present

## 2016-02-06 DIAGNOSIS — I130 Hypertensive heart and chronic kidney disease with heart failure and stage 1 through stage 4 chronic kidney disease, or unspecified chronic kidney disease: Secondary | ICD-10-CM | POA: Diagnosis not present

## 2016-02-06 DIAGNOSIS — I5033 Acute on chronic diastolic (congestive) heart failure: Secondary | ICD-10-CM | POA: Diagnosis not present

## 2016-02-06 DIAGNOSIS — J189 Pneumonia, unspecified organism: Secondary | ICD-10-CM | POA: Diagnosis not present

## 2016-02-06 DIAGNOSIS — N183 Chronic kidney disease, stage 3 (moderate): Secondary | ICD-10-CM | POA: Diagnosis not present

## 2016-02-06 DIAGNOSIS — I214 Non-ST elevation (NSTEMI) myocardial infarction: Secondary | ICD-10-CM | POA: Diagnosis not present

## 2016-02-06 DIAGNOSIS — I2510 Atherosclerotic heart disease of native coronary artery without angina pectoris: Secondary | ICD-10-CM | POA: Diagnosis not present

## 2016-02-07 ENCOUNTER — Ambulatory Visit: Payer: Medicare Other | Admitting: Cardiology

## 2016-03-07 DIAGNOSIS — I10 Essential (primary) hypertension: Secondary | ICD-10-CM | POA: Diagnosis not present

## 2016-03-07 DIAGNOSIS — I2510 Atherosclerotic heart disease of native coronary artery without angina pectoris: Secondary | ICD-10-CM | POA: Diagnosis not present

## 2016-03-07 DIAGNOSIS — E7800 Pure hypercholesterolemia, unspecified: Secondary | ICD-10-CM | POA: Diagnosis not present

## 2016-03-18 DIAGNOSIS — D649 Anemia, unspecified: Secondary | ICD-10-CM | POA: Diagnosis not present

## 2016-03-18 DIAGNOSIS — D509 Iron deficiency anemia, unspecified: Secondary | ICD-10-CM | POA: Diagnosis not present

## 2016-03-18 DIAGNOSIS — I129 Hypertensive chronic kidney disease with stage 1 through stage 4 chronic kidney disease, or unspecified chronic kidney disease: Secondary | ICD-10-CM | POA: Diagnosis not present

## 2016-03-18 DIAGNOSIS — N183 Chronic kidney disease, stage 3 (moderate): Secondary | ICD-10-CM | POA: Diagnosis not present

## 2016-03-18 DIAGNOSIS — Z79891 Long term (current) use of opiate analgesic: Secondary | ICD-10-CM | POA: Diagnosis not present

## 2016-03-18 DIAGNOSIS — E559 Vitamin D deficiency, unspecified: Secondary | ICD-10-CM | POA: Diagnosis not present

## 2016-03-18 DIAGNOSIS — R803 Bence Jones proteinuria: Secondary | ICD-10-CM | POA: Diagnosis not present

## 2016-03-24 DIAGNOSIS — I10 Essential (primary) hypertension: Secondary | ICD-10-CM | POA: Diagnosis not present

## 2016-03-24 DIAGNOSIS — E872 Acidosis: Secondary | ICD-10-CM | POA: Diagnosis not present

## 2016-03-24 DIAGNOSIS — N183 Chronic kidney disease, stage 3 (moderate): Secondary | ICD-10-CM | POA: Diagnosis not present

## 2016-03-24 DIAGNOSIS — R809 Proteinuria, unspecified: Secondary | ICD-10-CM | POA: Diagnosis not present

## 2016-03-31 ENCOUNTER — Other Ambulatory Visit: Payer: Self-pay | Admitting: Cardiology

## 2016-04-30 DIAGNOSIS — E7800 Pure hypercholesterolemia, unspecified: Secondary | ICD-10-CM | POA: Diagnosis not present

## 2016-04-30 DIAGNOSIS — I2510 Atherosclerotic heart disease of native coronary artery without angina pectoris: Secondary | ICD-10-CM | POA: Diagnosis not present

## 2016-04-30 DIAGNOSIS — I10 Essential (primary) hypertension: Secondary | ICD-10-CM | POA: Diagnosis not present

## 2016-05-05 ENCOUNTER — Other Ambulatory Visit: Payer: Self-pay | Admitting: Cardiology

## 2016-05-19 ENCOUNTER — Other Ambulatory Visit: Payer: Self-pay | Admitting: Cardiology

## 2016-05-27 DIAGNOSIS — I2510 Atherosclerotic heart disease of native coronary artery without angina pectoris: Secondary | ICD-10-CM | POA: Diagnosis not present

## 2016-05-27 DIAGNOSIS — E7800 Pure hypercholesterolemia, unspecified: Secondary | ICD-10-CM | POA: Diagnosis not present

## 2016-05-27 DIAGNOSIS — I10 Essential (primary) hypertension: Secondary | ICD-10-CM | POA: Diagnosis not present

## 2016-06-18 NOTE — Progress Notes (Signed)
Cardiology Office Note  Date: 06/19/2016   ID: Kevin [name], DOB [birth year], MRN [id number]  PCP: Glenda Chroman, MD  Primary Cardiologist: Rozann Lesches, MD   Chief Complaint  Patient presents with  . Coronary Artery Disease    History of Present Illness: Kevin [name] is an 83 y.o. male last seen in December 2017. He presents for a routine follow-up visit. Since last encounter he does not report any progressive angina symptoms or increasing nitroglycerin use. He remains functional with his ADLs. Not exercising regularly other than doing some walking. He continues to follow with Dr. Woody Seller and Dr. Lowanda Foster.  Current cardiac regimen includes aspirin, Norvasc, Lipitor, hydralazine, Imdur, Lopressor, and as needed nitroglycerin. He states that he will be seeing Dr. Woody Seller for a physical in August.  Past Medical History:  Diagnosis Date  . Arthritis   . CAD (coronary artery disease)    a. s/p DES to LAD (2014)  b. s/p LHC (09/2014) with patent LAD stent and stable dz from previous cath   . Chronic anemia   . CKD (chronic kidney disease), stage IV ([place])   . Claudication ([place])    R leg with bilateral femoral bruits.  . Dyslipidemia   . Essential hypertension, benign   . Myocardial infarction [facility])    nstemi  . Tobacco abuse     Past Surgical History:  Procedure Laterality Date  . Bilateral carpal tunnel release Bilateral   . CARDIAC CATHETERIZATION N/A 10/10/2014   Procedure: Left Heart Cath and Coronary Angiography;  Surgeon: Belva Crome, MD;  Location: [facility];  Service: Cardiovascular;  Laterality: N/A;  . LEFT HEART CATHETERIZATION WITH CORONARY ANGIOGRAM N/A 02/09/2012   Procedure: LEFT HEART CATHETERIZATION WITH CORONARY ANGIOGRAM;  Surgeon: Peter M Martinique, MD; left main 10%, LAD 80%/95%, D1 patent, CFX 70% at OM 2, dCFX 90%, RCA 100% with L-R collaterals    . PERCUTANEOUS CORONARY STENT INTERVENTION (PCI-S) N/A 02/11/2012   Procedure: PERCUTANEOUS CORONARY STENT  INTERVENTION (PCI-S);  Surgeon: Burnell Blanks, MD; 3.0 x 28 mm Promus Premier DES to the mid LAD   . REPAIR OF PERFORATED ULCER      Current Outpatient Prescriptions  Medication Sig Dispense Refill  . amLODipine (NORVASC) 10 MG tablet Take 10 mg by mouth daily.    Marland Kitchen aspirin EC 81 MG tablet Take 81 mg by mouth daily.    Marland Kitchen atorvastatin (LIPITOR) 80 MG tablet Take 1 tablet (80 mg total) by mouth every evening. 30 tablet 6  . Cholecalciferol (VITAMIN D-3) 1000 UNITS CAPS Take 1,000 Units by mouth daily.     Marland Kitchen guaiFENesin (MUCINEX) 600 MG 12 hr tablet Take 600 mg by mouth 2 (two) times daily.    . hydrALAZINE (APRESOLINE) 100 MG tablet TAKE ONE TABLET BY MOUTH THREE TIMES DAILY 90 tablet 6  . isosorbide mononitrate (IMDUR) 60 MG 24 hr tablet TAKE ONE TABLET BY MOUTH DAILY. 30 tablet 6  . loratadine (CLARITIN) 10 MG tablet Take 10 mg by mouth daily.  2  . metoprolol tartrate (LOPRESSOR) 25 MG tablet TAKE 1/2 TABLET BY MOUTH TWICE DAILY. 90 tablet 3  . nitroGLYCERIN (NITROSTAT) 0.4 MG SL tablet Place 1 tablet (0.4 mg total) under the tongue every 5 (five) minutes as needed for chest pain (up to 3 doses). 25 tablet 4  . Polysaccharide Iron Complex (POLY-IRON 150 PO) Take 150 mg by mouth daily.    . sodium bicarbonate 650 MG tablet Take 1 tablet (  650 mg total) by mouth 2 (two) times daily. 60 tablet 0   No current facility-administered medications for this visit.    Allergies:  Patient has no known allergies.   Social History: The patient  reports that he quit smoking about 4 years ago. His smoking use included Cigarettes. He started smoking about 48 years ago. He has a 22.50 pack-year smoking history. He has never used smokeless tobacco. He reports that he does not drink alcohol or use drugs.   ROS:  Please see the history of present illness. Otherwise, complete review of systems is positive for none.  All other systems are reviewed and negative.   Physical Exam: VS:  BP 128/60   Pulse  (!) 58   Ht 5\' 6"  (1.676 m)   Wt 199 lb (90.3 kg)   SpO2 98%   BMI 32.12 kg/m , BMI Body mass index is 32.12 kg/m.  Wt Readings from Last 3 Encounters:  06/19/16 199 lb (90.3 kg)  12/28/15 196 lb (88.9 kg)  12/12/15 194 lb (88 kg)    Overweight elderly male, appears comfortable at rest.  HEENT: Conjunctiva and lids normal, oropharynx clear.  Neck: Supple, no elevated JVP or carotid bruits, no thyromegaly.  Lungs: Few scattered upper airway rhonchi, nonlabored breathing at rest.  Cardiac: Regular rate and rhythm, no S3 or significant systolic murmur, no pericardial rub.  Abdomen: Soft, nontender, bowel sounds present.  Extremities: Mild edema in his hands, distal pulses 1+. Skin: Warm and dry. Musculoskeletal: No kyphosis. Neuropsychiatric: Alert and oriented 3, affect appropriate.  ECG: I personally reviewed the tracing from 12/05/2015 which showed sinus rhythm with short PR interval and nonspecific T-wave changes.  Recent Labwork: 12/11/2015: ALT 101; AST 52; B Natriuretic Peptide 298.7; BUN 15; Creatinine, Ser 1.86; Hemoglobin 11.5; Platelets 165; Potassium 4.1; Sodium 141; TSH 4.830     Component Value Date/Time   CHOL 123 12/11/2015 0152   TRIG 94 12/11/2015 0152   HDL 29 (L) 12/11/2015 0152   CHOLHDL 4.2 12/11/2015 0152   VLDL 19 12/11/2015 0152   LDLCALC 75 12/11/2015 0152    Other Studies Reviewed Today:  Echocardiogram 12/11/2015: Study Conclusions  - Left ventricle: The cavity size was normal. Wall thickness was increased in a pattern of mild LVH. Systolic function was normal. The estimated ejection fraction was in the range of 55% to 60%. Wall motion was normal; there were no regional wall motion abnormalities. Doppler parameters are consistent with abnormal left ventricular relaxation (grade 1 diastolic dysfunction). - Aortic valve: There was trivial regurgitation. - Mitral valve: There was mild regurgitation. - Left atrium: The atrium  was moderately dilated. - Pulmonary arteries: Systolic pressure was mildly increased. PA peak pressure: 32 mm Hg (S).  Impressions:  - Normal LV systolic function; grade 1 diastolic dysfunction; trace AI; mild MR; moderate LAE; trace TR with mildly elevated pulmonary pressure.  Cardiac catheterization 10/10/2014: 1. Mid RCA lesion, 100% stenosed. 2. Mid LAD to Dist LAD lesion, 10% stenosed. The lesion was previously treated with a stent (unknown type) . 3. 3rd Mrg lesion, 90% stenosed. 4. Dist RCA lesion, 100% stenosed. 5. 2nd Diag lesion, 75% stenosed. 6. Dist Cx-1 lesion, 75% stenosed. 7. Dist Cx-2 lesion, 65% stenosed.   Widely patent left anterior descending stent.  Total occlusion of the mid to distal RCA. Distal RCA is supplied by collaterals from the circumflex and LAD.  Highly diseased distal circumflex with a stable eccentric 75% stenosis proximal to the second obtuse marginal. There is  diffuse disease beyond the second obtuse marginal and 90% stenosis and a small third obtuse marginal branch. This distal circumflex territory as the source of collaterals to the occluded right coronary. The anatomy is unchanged from 2014.  LV function is not performed. LV end-diastolic pressure is 10 mmHg.  During 5/10 chest discomfort that developed at completion of the case, repeat angiography revealed identical anatomy to that noted above.  Assessment and Plan:  1. Symptomatically stable multivessel CAD status post DES to the LAD in 2014 with occluded mid to distal RCA associated with left to right collaterals and severe distal circumflex stenosis that is being managed medically. He has had no accelerating symptoms on medical therapy and we will continue with observation.  2. CKD stage 3, continues to follow with Dr. Lowanda Foster. Creatinine 1.8.  3. Essential hypertension, systolic blood pressure in the 120s today. No changes made present regimen.  4. Hyperlipidemia on Lipitor.  He will be due for follow-up lab work later this summer with Dr. Woody Seller.  Current medicines were reviewed with the patient today.  Disposition: Follow-up in 6 months.  Signed, Satira Sark, MD, [facility] 06/19/2016 9:21 AM    [facility] [place], [place], [place] [address] Phone: [phone]; Fax: [phone]

## 2016-06-19 ENCOUNTER — Encounter: Payer: Self-pay | Admitting: Cardiology

## 2016-06-19 ENCOUNTER — Ambulatory Visit (INDEPENDENT_AMBULATORY_CARE_PROVIDER_SITE_OTHER): Payer: Medicare Other | Admitting: Cardiology

## 2016-06-19 VITALS — BP 128/60 | HR 58 | Ht 66.0 in | Wt 199.0 lb

## 2016-06-19 DIAGNOSIS — I10 Essential (primary) hypertension: Secondary | ICD-10-CM | POA: Diagnosis not present

## 2016-06-19 DIAGNOSIS — N183 Chronic kidney disease, stage 3 unspecified: Secondary | ICD-10-CM

## 2016-06-19 DIAGNOSIS — E782 Mixed hyperlipidemia: Secondary | ICD-10-CM | POA: Diagnosis not present

## 2016-06-19 DIAGNOSIS — I25119 Atherosclerotic heart disease of native coronary artery with unspecified angina pectoris: Secondary | ICD-10-CM | POA: Diagnosis not present

## 2016-06-19 DIAGNOSIS — I209 Angina pectoris, unspecified: Secondary | ICD-10-CM | POA: Diagnosis not present

## 2016-06-19 NOTE — Patient Instructions (Signed)

## 2016-07-18 DIAGNOSIS — R803 Bence Jones proteinuria: Secondary | ICD-10-CM | POA: Diagnosis not present

## 2016-07-18 DIAGNOSIS — D649 Anemia, unspecified: Secondary | ICD-10-CM | POA: Diagnosis not present

## 2016-07-18 DIAGNOSIS — D509 Iron deficiency anemia, unspecified: Secondary | ICD-10-CM | POA: Diagnosis not present

## 2016-07-18 DIAGNOSIS — I129 Hypertensive chronic kidney disease with stage 1 through stage 4 chronic kidney disease, or unspecified chronic kidney disease: Secondary | ICD-10-CM | POA: Diagnosis not present

## 2016-07-18 DIAGNOSIS — N183 Chronic kidney disease, stage 3 (moderate): Secondary | ICD-10-CM | POA: Diagnosis not present

## 2016-07-18 DIAGNOSIS — Z79891 Long term (current) use of opiate analgesic: Secondary | ICD-10-CM | POA: Diagnosis not present

## 2016-07-18 DIAGNOSIS — E859 Amyloidosis, unspecified: Secondary | ICD-10-CM | POA: Diagnosis not present

## 2016-07-23 DIAGNOSIS — E7800 Pure hypercholesterolemia, unspecified: Secondary | ICD-10-CM | POA: Diagnosis not present

## 2016-07-23 DIAGNOSIS — R809 Proteinuria, unspecified: Secondary | ICD-10-CM | POA: Diagnosis not present

## 2016-07-23 DIAGNOSIS — N250 Renal osteodystrophy: Secondary | ICD-10-CM | POA: Diagnosis not present

## 2016-07-23 DIAGNOSIS — I2510 Atherosclerotic heart disease of native coronary artery without angina pectoris: Secondary | ICD-10-CM | POA: Diagnosis not present

## 2016-07-23 DIAGNOSIS — I10 Essential (primary) hypertension: Secondary | ICD-10-CM | POA: Diagnosis not present

## 2016-07-23 DIAGNOSIS — I509 Heart failure, unspecified: Secondary | ICD-10-CM | POA: Diagnosis not present

## 2016-07-23 DIAGNOSIS — N183 Chronic kidney disease, stage 3 (moderate): Secondary | ICD-10-CM | POA: Diagnosis not present

## 2016-08-14 ENCOUNTER — Other Ambulatory Visit: Payer: Self-pay | Admitting: Cardiology

## 2016-08-25 DIAGNOSIS — I2510 Atherosclerotic heart disease of native coronary artery without angina pectoris: Secondary | ICD-10-CM | POA: Diagnosis not present

## 2016-08-25 DIAGNOSIS — I10 Essential (primary) hypertension: Secondary | ICD-10-CM | POA: Diagnosis not present

## 2016-08-25 DIAGNOSIS — E7800 Pure hypercholesterolemia, unspecified: Secondary | ICD-10-CM | POA: Diagnosis not present

## 2016-09-11 DIAGNOSIS — Z6830 Body mass index (BMI) 30.0-30.9, adult: Secondary | ICD-10-CM | POA: Diagnosis not present

## 2016-09-11 DIAGNOSIS — R5383 Other fatigue: Secondary | ICD-10-CM | POA: Diagnosis not present

## 2016-09-11 DIAGNOSIS — Z79899 Other long term (current) drug therapy: Secondary | ICD-10-CM | POA: Diagnosis not present

## 2016-09-11 DIAGNOSIS — Z299 Encounter for prophylactic measures, unspecified: Secondary | ICD-10-CM | POA: Diagnosis not present

## 2016-09-11 DIAGNOSIS — E7800 Pure hypercholesterolemia, unspecified: Secondary | ICD-10-CM | POA: Diagnosis not present

## 2016-09-11 DIAGNOSIS — Z7189 Other specified counseling: Secondary | ICD-10-CM | POA: Diagnosis not present

## 2016-09-11 DIAGNOSIS — Z0000 Encounter for general adult medical examination without abnormal findings: Secondary | ICD-10-CM | POA: Diagnosis not present

## 2016-09-11 DIAGNOSIS — I10 Essential (primary) hypertension: Secondary | ICD-10-CM | POA: Diagnosis not present

## 2016-09-11 DIAGNOSIS — Z1389 Encounter for screening for other disorder: Secondary | ICD-10-CM | POA: Diagnosis not present

## 2016-09-11 DIAGNOSIS — I509 Heart failure, unspecified: Secondary | ICD-10-CM | POA: Diagnosis not present

## 2016-09-11 DIAGNOSIS — I2510 Atherosclerotic heart disease of native coronary artery without angina pectoris: Secondary | ICD-10-CM | POA: Diagnosis not present

## 2016-09-11 DIAGNOSIS — Z125 Encounter for screening for malignant neoplasm of prostate: Secondary | ICD-10-CM | POA: Diagnosis not present

## 2016-09-11 DIAGNOSIS — Z1211 Encounter for screening for malignant neoplasm of colon: Secondary | ICD-10-CM | POA: Diagnosis not present

## 2016-09-17 DIAGNOSIS — E7800 Pure hypercholesterolemia, unspecified: Secondary | ICD-10-CM | POA: Diagnosis not present

## 2016-09-17 DIAGNOSIS — I2510 Atherosclerotic heart disease of native coronary artery without angina pectoris: Secondary | ICD-10-CM | POA: Diagnosis not present

## 2016-09-17 DIAGNOSIS — I10 Essential (primary) hypertension: Secondary | ICD-10-CM | POA: Diagnosis not present

## 2016-11-06 DIAGNOSIS — E7800 Pure hypercholesterolemia, unspecified: Secondary | ICD-10-CM | POA: Diagnosis not present

## 2016-11-06 DIAGNOSIS — I2510 Atherosclerotic heart disease of native coronary artery without angina pectoris: Secondary | ICD-10-CM | POA: Diagnosis not present

## 2016-11-06 DIAGNOSIS — I10 Essential (primary) hypertension: Secondary | ICD-10-CM | POA: Diagnosis not present

## 2016-11-20 DIAGNOSIS — R809 Proteinuria, unspecified: Secondary | ICD-10-CM | POA: Diagnosis not present

## 2016-11-20 DIAGNOSIS — N183 Chronic kidney disease, stage 3 (moderate): Secondary | ICD-10-CM | POA: Diagnosis not present

## 2016-11-20 DIAGNOSIS — D509 Iron deficiency anemia, unspecified: Secondary | ICD-10-CM | POA: Diagnosis not present

## 2016-11-20 DIAGNOSIS — E559 Vitamin D deficiency, unspecified: Secondary | ICD-10-CM | POA: Diagnosis not present

## 2016-11-20 DIAGNOSIS — I129 Hypertensive chronic kidney disease with stage 1 through stage 4 chronic kidney disease, or unspecified chronic kidney disease: Secondary | ICD-10-CM | POA: Diagnosis not present

## 2016-11-20 DIAGNOSIS — Z79899 Other long term (current) drug therapy: Secondary | ICD-10-CM | POA: Diagnosis not present

## 2016-11-25 DIAGNOSIS — I10 Essential (primary) hypertension: Secondary | ICD-10-CM | POA: Diagnosis not present

## 2016-11-25 DIAGNOSIS — I509 Heart failure, unspecified: Secondary | ICD-10-CM | POA: Diagnosis not present

## 2016-11-25 DIAGNOSIS — N183 Chronic kidney disease, stage 3 (moderate): Secondary | ICD-10-CM | POA: Diagnosis not present

## 2016-11-25 DIAGNOSIS — E872 Acidosis: Secondary | ICD-10-CM | POA: Diagnosis not present

## 2016-12-04 DIAGNOSIS — E7800 Pure hypercholesterolemia, unspecified: Secondary | ICD-10-CM | POA: Diagnosis not present

## 2016-12-04 DIAGNOSIS — I10 Essential (primary) hypertension: Secondary | ICD-10-CM | POA: Diagnosis not present

## 2016-12-04 DIAGNOSIS — I2510 Atherosclerotic heart disease of native coronary artery without angina pectoris: Secondary | ICD-10-CM | POA: Diagnosis not present

## 2016-12-15 NOTE — Progress Notes (Signed)
Cardiology Office Note  Date: 12/16/2016   ID: Kevin [name], DOB 08/24/1930, MRN [id number]  PCP: Glenda Chroman, MD  Primary Cardiologist: Rozann Lesches, MD   Chief Complaint  Patient presents with  . Coronary Artery Disease    History of Present Illness: Kevin [name] is an 83 y.o. male last seen in May.  He presents for a routine follow-up visit.  States that he is doing well overall, no angina symptoms or nitroglycerin use.  He reports NYHA class II dyspnea with typical activities, no palpitations or syncope.  Multivessel CAD status post DES to the LAD in 2014 with occluded mid to distal RCA associated with left to right collaterals and severe distal circumflex stenosis that is being managed medically.  Last cardiac catheterization was in 2016.  He continues on medical therapy including aspirin, Norvasc, Lipitor, hydralazine, Imdur, Lopressor, and as needed nitroglycerin.  I reviewed his recent follow-up lab work which is outlined below.  Past Medical History:  Diagnosis Date  . Arthritis   . CAD (coronary artery disease)    a. s/p DES to LAD (2014)  b. s/p LHC (09/2014) with patent LAD stent and stable dz from previous cath   . Chronic anemia   . CKD (chronic kidney disease), stage IV ([place])   . Claudication ([place])    R leg with bilateral femoral bruits.  . Dyslipidemia   . Essential hypertension, benign   . Myocardial infarction [facility])    nstemi  . Tobacco abuse     Past Surgical History:  Procedure Laterality Date  . Bilateral carpal tunnel release Bilateral   . Left Heart Cath and Coronary Angiography N/A 10/10/2014   Performed by Belva Crome, MD at [place] [facility]  . LEFT HEART CATHETERIZATION WITH CORONARY ANGIOGRAM N/A 02/09/2012   Performed by Martinique, Peter M, MD at [facility] CATH LAB  . PERCUTANEOUS CORONARY STENT INTERVENTION (PCI-S) N/A 02/11/2012   Performed by Burnell Blanks, MD at [facility] CATH LAB  . REPAIR OF PERFORATED ULCER      Current  Outpatient Medications  Medication Sig Dispense Refill  . amLODipine (NORVASC) 10 MG tablet Take 10 mg by mouth daily.    Marland Kitchen aspirin EC 81 MG tablet Take 81 mg by mouth daily.    Marland Kitchen atorvastatin (LIPITOR) 80 MG tablet TAKE ONE TABLET BY MOUTH EVERY EVENING. 30 tablet 6  . Cholecalciferol (VITAMIN D-3) 1000 UNITS CAPS Take 1,000 Units by mouth daily.     . hydrALAZINE (APRESOLINE) 100 MG tablet TAKE ONE TABLET BY MOUTH THREE TIMES DAILY 90 tablet 6  . isosorbide mononitrate (IMDUR) 60 MG 24 hr tablet TAKE ONE TABLET BY MOUTH DAILY. 30 tablet 6  . loratadine (CLARITIN) 10 MG tablet Take 10 mg by mouth daily.  2  . metoprolol tartrate (LOPRESSOR) 25 MG tablet TAKE 1/2 TABLET BY MOUTH TWICE DAILY. 90 tablet 3  . nitroGLYCERIN (NITROSTAT) 0.4 MG SL tablet Place 1 tablet (0.4 mg total) under the tongue every 5 (five) minutes as needed for chest pain (up to 3 doses). 25 tablet 4  . Polysaccharide Iron Complex (POLY-IRON 150 PO) Take 150 mg by mouth daily.    . sodium bicarbonate 650 MG tablet Take 1 tablet (650 mg total) by mouth 2 (two) times daily. 60 tablet 0   No current facility-administered medications for this visit.    Allergies:  Patient has no known allergies.   Social History: The patient  reports that he quit  smoking about 4 years ago. His smoking use included cigarettes. He started smoking about 48 years ago. He has a 22.50 pack-year smoking history. he has never used smokeless tobacco. He reports that he does not drink alcohol or use drugs.   ROS:  Please see the history of present illness. Otherwise, complete review of systems is positive for none.  All other systems are reviewed and negative.   Physical Exam: VS:  BP 124/68   Pulse (!) 58   Ht 5\' 6"  (1.676 m)   Wt 202 lb (91.6 kg)   SpO2 98%   BMI 32.60 kg/m , BMI Body mass index is 32.6 kg/m.  Wt Readings from Last 3 Encounters:  12/16/16 202 lb (91.6 kg)  06/19/16 199 lb (90.3 kg)  12/28/15 196 lb (88.9 kg)    General:  Overweight elderly male, appears comfortable at rest. HEENT: Conjunctiva and lids normal, oropharynx clear. Neck: Supple, no elevated JVP or carotid bruits, no thyromegaly. Lungs: Clear to auscultation, nonlabored breathing at rest. Cardiac: Regular rate and rhythm, no S3 or significant systolic murmur, no pericardial rub. Abdomen: Soft, nontender, bowel sounds present, no guarding or rebound. Extremities: Mild ankle edema, distal pulses 1-2+. Skin: Warm and dry. Musculoskeletal: No kyphosis. Neuropsychiatric: Alert and oriented x3, affect grossly appropriate.  ECG: I personally reviewed the tracing from 12/05/2015 which showed sinus rhythm with short PR interval and nonspecific ST-T changes.  Recent Labwork:    Component Value Date/Time   CHOL 123 12/11/2015 0152   TRIG 94 12/11/2015 0152   HDL 29 (L) 12/11/2015 0152   CHOLHDL 4.2 12/11/2015 0152   VLDL 19 12/11/2015 0152   LDLCALC 75 12/11/2015 0152  November 2017: Creatinine 1.86, hemoglobin 11.5, platelets 165 October 2018: BUN 21, creatinine 1.65, potassium 4.4, Hgb 12.7  Other Studies Reviewed Today:  Echocardiogram 12/11/2015: Study Conclusions  - Left ventricle: The cavity size was normal. Wall thickness was   increased in a pattern of mild LVH. Systolic function was normal.   The estimated ejection fraction was in the range of 55% to 60%.   Wall motion was normal; there were no regional wall motion   abnormalities. Doppler parameters are consistent with abnormal   left ventricular relaxation (grade 1 diastolic dysfunction). - Aortic valve: There was trivial regurgitation. - Mitral valve: There was mild regurgitation. - Left atrium: The atrium was moderately dilated. - Pulmonary arteries: Systolic pressure was mildly increased. PA   peak pressure: 32 mm Hg (S).  Impressions:  - Normal LV systolic function; grade 1 diastolic dysfunction; trace   AI; mild MR; moderate LAE; trace TR with mildly elevated    pulmonary pressure.  Assessment and Plan:  1.  CAD status post DES to the LAD in 2014 with occluded mid to distal RCA associated with collaterals from the left.  He also has severe distal circumflex disease has been managed medically.  Fortunately, he remained stable without active angina on current regimen and will continue with observation.  2.  CKD stage III, most recent creatinine down to 1.65.  Continue to follow with Dr. Lowanda Foster.  3.  Essential hypertension, blood pressure control is good today.  4.  Mixed hyperlipidemia, continues on Lipitor.  He follows with Dr. Woody Seller.  Current medicines were reviewed with the patient today.   Orders Placed This Encounter  Procedures  . EKG 12-Lead    Disposition: Follow-up in 6 months.  Signed, Satira Sark, MD, [facility] 12/16/2016 9:56 AM    [facility] [place], [place], [place] [address] Phone: [phone]; Fax: [phone]

## 2016-12-16 ENCOUNTER — Encounter: Payer: Self-pay | Admitting: Cardiology

## 2016-12-16 ENCOUNTER — Ambulatory Visit (INDEPENDENT_AMBULATORY_CARE_PROVIDER_SITE_OTHER): Payer: Medicare Other | Admitting: Cardiology

## 2016-12-16 VITALS — BP 124/68 | HR 58 | Ht 66.0 in | Wt 202.0 lb

## 2016-12-16 DIAGNOSIS — I25119 Atherosclerotic heart disease of native coronary artery with unspecified angina pectoris: Secondary | ICD-10-CM | POA: Diagnosis not present

## 2016-12-16 DIAGNOSIS — N183 Chronic kidney disease, stage 3 unspecified: Secondary | ICD-10-CM

## 2016-12-16 DIAGNOSIS — I10 Essential (primary) hypertension: Secondary | ICD-10-CM | POA: Diagnosis not present

## 2016-12-16 DIAGNOSIS — I209 Angina pectoris, unspecified: Secondary | ICD-10-CM

## 2016-12-16 DIAGNOSIS — E782 Mixed hyperlipidemia: Secondary | ICD-10-CM

## 2016-12-16 NOTE — Patient Instructions (Signed)

## 2016-12-24 DIAGNOSIS — E7800 Pure hypercholesterolemia, unspecified: Secondary | ICD-10-CM | POA: Diagnosis not present

## 2016-12-24 DIAGNOSIS — I10 Essential (primary) hypertension: Secondary | ICD-10-CM | POA: Diagnosis not present

## 2016-12-24 DIAGNOSIS — I2510 Atherosclerotic heart disease of native coronary artery without angina pectoris: Secondary | ICD-10-CM | POA: Diagnosis not present

## 2016-12-24 DIAGNOSIS — Z6831 Body mass index (BMI) 31.0-31.9, adult: Secondary | ICD-10-CM | POA: Diagnosis not present

## 2016-12-24 DIAGNOSIS — Z299 Encounter for prophylactic measures, unspecified: Secondary | ICD-10-CM | POA: Diagnosis not present

## 2016-12-24 DIAGNOSIS — H6090 Unspecified otitis externa, unspecified ear: Secondary | ICD-10-CM | POA: Diagnosis not present

## 2017-02-18 ENCOUNTER — Other Ambulatory Visit: Payer: Self-pay | Admitting: Cardiology

## 2017-02-23 DIAGNOSIS — I2510 Atherosclerotic heart disease of native coronary artery without angina pectoris: Secondary | ICD-10-CM | POA: Diagnosis not present

## 2017-02-23 DIAGNOSIS — E7800 Pure hypercholesterolemia, unspecified: Secondary | ICD-10-CM | POA: Diagnosis not present

## 2017-02-23 DIAGNOSIS — I10 Essential (primary) hypertension: Secondary | ICD-10-CM | POA: Diagnosis not present

## 2017-03-20 DIAGNOSIS — E7800 Pure hypercholesterolemia, unspecified: Secondary | ICD-10-CM | POA: Diagnosis not present

## 2017-03-20 DIAGNOSIS — I10 Essential (primary) hypertension: Secondary | ICD-10-CM | POA: Diagnosis not present

## 2017-03-20 DIAGNOSIS — I2510 Atherosclerotic heart disease of native coronary artery without angina pectoris: Secondary | ICD-10-CM | POA: Diagnosis not present

## 2017-03-25 DIAGNOSIS — N183 Chronic kidney disease, stage 3 (moderate): Secondary | ICD-10-CM | POA: Diagnosis not present

## 2017-03-25 DIAGNOSIS — I129 Hypertensive chronic kidney disease with stage 1 through stage 4 chronic kidney disease, or unspecified chronic kidney disease: Secondary | ICD-10-CM | POA: Diagnosis not present

## 2017-03-25 DIAGNOSIS — D509 Iron deficiency anemia, unspecified: Secondary | ICD-10-CM | POA: Diagnosis not present

## 2017-03-25 DIAGNOSIS — E559 Vitamin D deficiency, unspecified: Secondary | ICD-10-CM | POA: Diagnosis not present

## 2017-03-25 DIAGNOSIS — Z79899 Other long term (current) drug therapy: Secondary | ICD-10-CM | POA: Diagnosis not present

## 2017-03-25 DIAGNOSIS — R809 Proteinuria, unspecified: Secondary | ICD-10-CM | POA: Diagnosis not present

## 2017-04-01 DIAGNOSIS — I10 Essential (primary) hypertension: Secondary | ICD-10-CM | POA: Diagnosis not present

## 2017-04-01 DIAGNOSIS — R809 Proteinuria, unspecified: Secondary | ICD-10-CM | POA: Diagnosis not present

## 2017-04-01 DIAGNOSIS — N183 Chronic kidney disease, stage 3 (moderate): Secondary | ICD-10-CM | POA: Diagnosis not present

## 2017-04-01 DIAGNOSIS — E872 Acidosis: Secondary | ICD-10-CM | POA: Diagnosis not present

## 2017-04-08 DIAGNOSIS — E7800 Pure hypercholesterolemia, unspecified: Secondary | ICD-10-CM | POA: Diagnosis not present

## 2017-04-08 DIAGNOSIS — I2510 Atherosclerotic heart disease of native coronary artery without angina pectoris: Secondary | ICD-10-CM | POA: Diagnosis not present

## 2017-04-08 DIAGNOSIS — I10 Essential (primary) hypertension: Secondary | ICD-10-CM | POA: Diagnosis not present

## 2017-04-15 ENCOUNTER — Other Ambulatory Visit: Payer: Self-pay | Admitting: Cardiology

## 2017-04-23 DIAGNOSIS — Z6831 Body mass index (BMI) 31.0-31.9, adult: Secondary | ICD-10-CM | POA: Diagnosis not present

## 2017-04-23 DIAGNOSIS — N183 Chronic kidney disease, stage 3 (moderate): Secondary | ICD-10-CM | POA: Diagnosis not present

## 2017-04-23 DIAGNOSIS — I10 Essential (primary) hypertension: Secondary | ICD-10-CM | POA: Diagnosis not present

## 2017-04-23 DIAGNOSIS — Z299 Encounter for prophylactic measures, unspecified: Secondary | ICD-10-CM | POA: Diagnosis not present

## 2017-04-23 DIAGNOSIS — E7800 Pure hypercholesterolemia, unspecified: Secondary | ICD-10-CM | POA: Diagnosis not present

## 2017-04-23 DIAGNOSIS — I509 Heart failure, unspecified: Secondary | ICD-10-CM | POA: Diagnosis not present

## 2017-05-07 DIAGNOSIS — I2510 Atherosclerotic heart disease of native coronary artery without angina pectoris: Secondary | ICD-10-CM | POA: Diagnosis not present

## 2017-05-07 DIAGNOSIS — I10 Essential (primary) hypertension: Secondary | ICD-10-CM | POA: Diagnosis not present

## 2017-05-07 DIAGNOSIS — E7800 Pure hypercholesterolemia, unspecified: Secondary | ICD-10-CM | POA: Diagnosis not present

## 2017-05-22 ENCOUNTER — Other Ambulatory Visit: Payer: Self-pay | Admitting: Cardiology

## 2017-06-14 NOTE — Progress Notes (Signed)
Cardiology Office Note  Date: 06/15/2017   ID: Kevin [name], DOB October 20, 1930, MRN [id number]  PCP: Glenda Chroman, MD  Primary Cardiologist: Rozann Lesches, MD   Chief Complaint  Patient presents with  . Coronary Artery Disease    History of Present Illness: Kevin [name] is an 84 y.o. male last seen in November 2018.  He presents for a routine visit.  Remains stable without angina symptoms or increasing nitroglycerin use, in fact he has an old bottle with him.  He reports NYHA class II dyspnea with typical activities.  No palpitations or syncope.  He has multivessel CAD status post DES to the LAD in 2014 with occluded mid to distal RCA associated with left to right collaterals and severe distal circumflex stenosis that is being managed medically.  Last cardiac catheterization was in 2016.  Current cardiac regimen includes aspirin, Norvasc, Lipitor, hydralazine, Imdur, Lopressor, and as needed nitroglycerin.  He indicates having lab work with Dr. Woody Seller earlier this year, results to be requested.   Past Medical History:  Diagnosis Date  . Arthritis   . CAD (coronary artery disease)    a. s/p DES to LAD (2014)  b. s/p LHC (09/2014) with patent LAD stent and stable dz from previous cath   . Chronic anemia   . CKD (chronic kidney disease), stage IV ([place])   . Claudication ([place])    R leg with bilateral femoral bruits.  . Dyslipidemia   . Essential hypertension, benign   . Myocardial infarction [facility])    nstemi  . Tobacco abuse     Past Surgical History:  Procedure Laterality Date  . Bilateral carpal tunnel release Bilateral   . CARDIAC CATHETERIZATION N/A 10/10/2014   Procedure: Left Heart Cath and Coronary Angiography;  Surgeon: Belva Crome, MD;  Location: [place] [facility];  Service: Cardiovascular;  Laterality: N/A;  . LEFT HEART CATHETERIZATION WITH CORONARY ANGIOGRAM N/A 02/09/2012   Procedure: LEFT HEART CATHETERIZATION WITH CORONARY ANGIOGRAM;  Surgeon: Peter M Martinique,  MD; left main 10%, LAD 80%/95%, D1 patent, CFX 70% at OM 2, dCFX 90%, RCA 100% with L-R collaterals    . PERCUTANEOUS CORONARY STENT INTERVENTION (PCI-S) N/A 02/11/2012   Procedure: PERCUTANEOUS CORONARY STENT INTERVENTION (PCI-S);  Surgeon: Burnell Blanks, MD; 3.0 x 28 mm Promus Premier DES to the mid LAD   . REPAIR OF PERFORATED ULCER      Current Outpatient Medications  Medication Sig Dispense Refill  . amLODipine (NORVASC) 10 MG tablet Take 10 mg by mouth daily.    Marland Kitchen aspirin EC 81 MG tablet Take 81 mg by mouth daily.    Marland Kitchen atorvastatin (LIPITOR) 80 MG tablet TAKE ONE TABLET BY MOUTH EVERY EVENING. 30 tablet 6  . Cholecalciferol (VITAMIN D-3) 1000 UNITS CAPS Take 1,000 Units by mouth daily.     . hydrALAZINE (APRESOLINE) 100 MG tablet TAKE ONE TABLET BY MOUTH THREE TIMES DAILY 90 tablet 2  . isosorbide mononitrate (IMDUR) 60 MG 24 hr tablet TAKE ONE TABLET BY MOUTH DAILY. 30 tablet 6  . loratadine (CLARITIN) 10 MG tablet Take 10 mg by mouth daily.  2  . metoprolol tartrate (LOPRESSOR) 25 MG tablet TAKE 1/2 TABLET BY MOUTH TWICE DAILY. 90 tablet 0  . nitroGLYCERIN (NITROSTAT) 0.4 MG SL tablet Place 1 tablet (0.4 mg total) under the tongue every 5 (five) minutes as needed for chest pain (up to 3 doses). 25 tablet 3  . Polysaccharide Iron Complex (POLY-IRON 150 PO) Take  150 mg by mouth daily.    . sodium bicarbonate 650 MG tablet Take 1 tablet (650 mg total) by mouth 2 (two) times daily. 60 tablet 0   No current facility-administered medications for this visit.    Allergies:  Patient has no known allergies.   Social History: The patient  reports that he quit smoking about 5 years ago. His smoking use included cigarettes. He started smoking about 49 years ago. He has a 22.50 pack-year smoking history. He has never used smokeless tobacco. He reports that he does not drink alcohol or use drugs.   ROS:  Please see the history of present illness. Otherwise, complete review of systems is  positive for none.  All other systems are reviewed and negative.   Physical Exam: VS:  BP (!) 149/62   Pulse (!) 58   Ht 5\' 6"  (1.676 m)   Wt 205 lb (93 kg)   SpO2 97%   BMI 33.09 kg/m , BMI Body mass index is 33.09 kg/m.  Wt Readings from Last 3 Encounters:  06/15/17 205 lb (93 kg)  12/16/16 202 lb (91.6 kg)  06/19/16 199 lb (90.3 kg)    General: Overweight elderly male, appears comfortable at rest. HEENT: Conjunctiva and lids normal, oropharynx clear. Neck: Supple, no elevated JVP or carotid bruits, no thyromegaly. Lungs: Clear to auscultation, nonlabored breathing at rest. Cardiac: Regular rate and rhythm, no S3 or significant systolic murmur, no pericardial rub. Abdomen: Soft, nontender, bowel sounds present. Extremities: Trace ankle edema, distal pulses 2+. Skin: Warm and dry. Musculoskeletal: No kyphosis. Neuropsychiatric: Alert and oriented x3, affect grossly appropriate.  ECG: I personally reviewed the tracing from 12/16/2016 which showed sinus bradycardia with short PR and diffuse nonspecific ST/T changes.  Recent Labwork:  October 2018: BUN 21, creatinine 1.65, potassium 4.4, Hgb 12.7  Other Studies Reviewed Today:  Echocardiogram 12/11/2015: Study Conclusions  - Left ventricle: The cavity size was normal. Wall thickness was increased in a pattern of mild LVH. Systolic function was normal. The estimated ejection fraction was in the range of 55% to 60%. Wall motion was normal; there were no regional wall motion abnormalities. Doppler parameters are consistent with abnormal left ventricular relaxation (grade 1 diastolic dysfunction). - Aortic valve: There was trivial regurgitation. - Mitral valve: There was mild regurgitation. - Left atrium: The atrium was moderately dilated. - Pulmonary arteries: Systolic pressure was mildly increased. PA peak pressure: 32 mm Hg (S).  Impressions:  - Normal LV systolic function; grade 1 diastolic  dysfunction; trace AI; mild MR; moderate LAE; trace TR with mildly elevated pulmonary pressure.  Assessment and Plan:  1.  CAD status post DES to the LAD in 2014 with known occlusion of the mid to distal RCA associated with left-sided collaterals.  Also has severe distal circumflex disease that has been managed medically.  He remains stable without active angina symptoms.  Refill provided for fresh bottle of nitroglycerin.  2.  Mixed hyperlipidemia, on Lipitor.  Requesting most recent lab work from Dr. Woody Seller.  3.  Essential hypertension, no changes made to present regimen.  He continues on multimodal therapy.  4.  CKD stage III, follows with nephrology.  Current medicines were reviewed with the patient today.  Disposition: Follow-up in 6 months.  Signed, Satira Sark, MD, [facility] 06/15/2017 2:24 PM    [facility] [place], [place], [place] [address] Phone: [phone]; Fax: [phone]

## 2017-06-15 ENCOUNTER — Encounter: Payer: Self-pay | Admitting: *Deleted

## 2017-06-15 ENCOUNTER — Encounter: Payer: Self-pay | Admitting: Cardiology

## 2017-06-15 ENCOUNTER — Ambulatory Visit (INDEPENDENT_AMBULATORY_CARE_PROVIDER_SITE_OTHER): Payer: Medicare Other | Admitting: Cardiology

## 2017-06-15 VITALS — BP 149/62 | HR 58 | Ht 66.0 in | Wt 205.0 lb

## 2017-06-15 DIAGNOSIS — N183 Chronic kidney disease, stage 3 unspecified: Secondary | ICD-10-CM

## 2017-06-15 DIAGNOSIS — I25119 Atherosclerotic heart disease of native coronary artery with unspecified angina pectoris: Secondary | ICD-10-CM | POA: Diagnosis not present

## 2017-06-15 DIAGNOSIS — E782 Mixed hyperlipidemia: Secondary | ICD-10-CM

## 2017-06-15 DIAGNOSIS — I10 Essential (primary) hypertension: Secondary | ICD-10-CM

## 2017-06-15 MED ORDER — NITROGLYCERIN 0.4 MG SL SUBL: 0.4000 mg | SUBLINGUAL_TABLET | SUBLINGUAL | 3 refills | Status: DC | PRN

## 2017-06-15 NOTE — Patient Instructions (Signed)

## 2017-06-17 DIAGNOSIS — I10 Essential (primary) hypertension: Secondary | ICD-10-CM | POA: Diagnosis not present

## 2017-06-17 DIAGNOSIS — I2510 Atherosclerotic heart disease of native coronary artery without angina pectoris: Secondary | ICD-10-CM | POA: Diagnosis not present

## 2017-06-17 DIAGNOSIS — E7800 Pure hypercholesterolemia, unspecified: Secondary | ICD-10-CM | POA: Diagnosis not present

## 2017-07-31 DIAGNOSIS — I129 Hypertensive chronic kidney disease with stage 1 through stage 4 chronic kidney disease, or unspecified chronic kidney disease: Secondary | ICD-10-CM | POA: Diagnosis not present

## 2017-07-31 DIAGNOSIS — R809 Proteinuria, unspecified: Secondary | ICD-10-CM | POA: Diagnosis not present

## 2017-07-31 DIAGNOSIS — N183 Chronic kidney disease, stage 3 (moderate): Secondary | ICD-10-CM | POA: Diagnosis not present

## 2017-07-31 DIAGNOSIS — D509 Iron deficiency anemia, unspecified: Secondary | ICD-10-CM | POA: Diagnosis not present

## 2017-07-31 DIAGNOSIS — E559 Vitamin D deficiency, unspecified: Secondary | ICD-10-CM | POA: Diagnosis not present

## 2017-07-31 DIAGNOSIS — Z79899 Other long term (current) drug therapy: Secondary | ICD-10-CM | POA: Diagnosis not present

## 2017-08-05 DIAGNOSIS — I509 Heart failure, unspecified: Secondary | ICD-10-CM | POA: Diagnosis not present

## 2017-08-05 DIAGNOSIS — N183 Chronic kidney disease, stage 3 (moderate): Secondary | ICD-10-CM | POA: Diagnosis not present

## 2017-08-05 DIAGNOSIS — E872 Acidosis: Secondary | ICD-10-CM | POA: Diagnosis not present

## 2017-08-05 DIAGNOSIS — E875 Hyperkalemia: Secondary | ICD-10-CM | POA: Diagnosis not present

## 2017-08-05 DIAGNOSIS — R809 Proteinuria, unspecified: Secondary | ICD-10-CM | POA: Diagnosis not present

## 2017-08-05 DIAGNOSIS — I10 Essential (primary) hypertension: Secondary | ICD-10-CM | POA: Diagnosis not present

## 2017-08-05 DIAGNOSIS — E559 Vitamin D deficiency, unspecified: Secondary | ICD-10-CM | POA: Diagnosis not present

## 2017-08-07 DIAGNOSIS — E7800 Pure hypercholesterolemia, unspecified: Secondary | ICD-10-CM | POA: Diagnosis not present

## 2017-08-07 DIAGNOSIS — I2510 Atherosclerotic heart disease of native coronary artery without angina pectoris: Secondary | ICD-10-CM | POA: Diagnosis not present

## 2017-08-07 DIAGNOSIS — I10 Essential (primary) hypertension: Secondary | ICD-10-CM | POA: Diagnosis not present

## 2017-08-17 ENCOUNTER — Other Ambulatory Visit: Payer: Self-pay | Admitting: Cardiology

## 2017-09-02 DIAGNOSIS — E7800 Pure hypercholesterolemia, unspecified: Secondary | ICD-10-CM | POA: Diagnosis not present

## 2017-09-02 DIAGNOSIS — I10 Essential (primary) hypertension: Secondary | ICD-10-CM | POA: Diagnosis not present

## 2017-09-02 DIAGNOSIS — I2510 Atherosclerotic heart disease of native coronary artery without angina pectoris: Secondary | ICD-10-CM | POA: Diagnosis not present

## 2017-09-22 DIAGNOSIS — R5383 Other fatigue: Secondary | ICD-10-CM | POA: Diagnosis not present

## 2017-09-22 DIAGNOSIS — Z1339 Encounter for screening examination for other mental health and behavioral disorders: Secondary | ICD-10-CM | POA: Diagnosis not present

## 2017-09-22 DIAGNOSIS — Z7189 Other specified counseling: Secondary | ICD-10-CM | POA: Diagnosis not present

## 2017-09-22 DIAGNOSIS — Z79899 Other long term (current) drug therapy: Secondary | ICD-10-CM | POA: Diagnosis not present

## 2017-09-22 DIAGNOSIS — I10 Essential (primary) hypertension: Secondary | ICD-10-CM | POA: Diagnosis not present

## 2017-09-22 DIAGNOSIS — Z87891 Personal history of nicotine dependence: Secondary | ICD-10-CM | POA: Diagnosis not present

## 2017-09-22 DIAGNOSIS — Z0000 Encounter for general adult medical examination without abnormal findings: Secondary | ICD-10-CM | POA: Diagnosis not present

## 2017-09-22 DIAGNOSIS — Z125 Encounter for screening for malignant neoplasm of prostate: Secondary | ICD-10-CM | POA: Diagnosis not present

## 2017-09-22 DIAGNOSIS — E7800 Pure hypercholesterolemia, unspecified: Secondary | ICD-10-CM | POA: Diagnosis not present

## 2017-09-22 DIAGNOSIS — Z6831 Body mass index (BMI) 31.0-31.9, adult: Secondary | ICD-10-CM | POA: Diagnosis not present

## 2017-09-22 DIAGNOSIS — Z1331 Encounter for screening for depression: Secondary | ICD-10-CM | POA: Diagnosis not present

## 2017-09-22 DIAGNOSIS — Z1211 Encounter for screening for malignant neoplasm of colon: Secondary | ICD-10-CM | POA: Diagnosis not present

## 2017-09-22 DIAGNOSIS — Z299 Encounter for prophylactic measures, unspecified: Secondary | ICD-10-CM | POA: Diagnosis not present

## 2017-10-01 ENCOUNTER — Other Ambulatory Visit: Payer: Self-pay | Admitting: Cardiovascular Disease

## 2017-10-01 DIAGNOSIS — Z6832 Body mass index (BMI) 32.0-32.9, adult: Secondary | ICD-10-CM | POA: Diagnosis not present

## 2017-10-01 DIAGNOSIS — Z299 Encounter for prophylactic measures, unspecified: Secondary | ICD-10-CM | POA: Diagnosis not present

## 2017-10-01 DIAGNOSIS — M25561 Pain in right knee: Secondary | ICD-10-CM | POA: Diagnosis not present

## 2017-10-01 DIAGNOSIS — I10 Essential (primary) hypertension: Secondary | ICD-10-CM | POA: Diagnosis not present

## 2017-10-02 DIAGNOSIS — I2510 Atherosclerotic heart disease of native coronary artery without angina pectoris: Secondary | ICD-10-CM | POA: Diagnosis not present

## 2017-10-02 DIAGNOSIS — I10 Essential (primary) hypertension: Secondary | ICD-10-CM | POA: Diagnosis not present

## 2017-10-02 DIAGNOSIS — E7800 Pure hypercholesterolemia, unspecified: Secondary | ICD-10-CM | POA: Diagnosis not present

## 2017-10-13 ENCOUNTER — Other Ambulatory Visit: Payer: Self-pay | Admitting: Cardiology

## 2017-10-29 DIAGNOSIS — E7800 Pure hypercholesterolemia, unspecified: Secondary | ICD-10-CM | POA: Diagnosis not present

## 2017-10-29 DIAGNOSIS — I10 Essential (primary) hypertension: Secondary | ICD-10-CM | POA: Diagnosis not present

## 2017-10-29 DIAGNOSIS — I2510 Atherosclerotic heart disease of native coronary artery without angina pectoris: Secondary | ICD-10-CM | POA: Diagnosis not present

## 2017-11-30 DIAGNOSIS — I10 Essential (primary) hypertension: Secondary | ICD-10-CM | POA: Diagnosis not present

## 2017-11-30 DIAGNOSIS — E7800 Pure hypercholesterolemia, unspecified: Secondary | ICD-10-CM | POA: Diagnosis not present

## 2017-11-30 DIAGNOSIS — I2510 Atherosclerotic heart disease of native coronary artery without angina pectoris: Secondary | ICD-10-CM | POA: Diagnosis not present

## 2017-12-18 NOTE — Progress Notes (Signed)
Cardiology Office Note  Date: 12/21/2017   ID: Kevin [name], DOB [birth year], MRN [id number]  PCP: Glenda Chroman, MD  Primary Cardiologist: Rozann Lesches, MD   Chief Complaint  Patient presents with  . Coronary Artery Disease    History of Present Illness: Kevin [name] is an 84 y.o. male last seen in May.  He is here for a routine visit.  He does not report any angina or nitroglycerin use since last encounter.  States that he has been taking his medications regularly.  He is fairly sedentary, does a little bit of outside work, has blown some leaves recently.  I personally reviewed his ECG today which shows sinus rhythm with PVC, nonspecific ST-T changes.  Current cardiac regimen includes aspirin, Norvasc, Lipitor, hydralazine, Imdur, Lopressor, and as needed nitroglycerin.  Past Medical History:  Diagnosis Date  . Arthritis   . CAD (coronary artery disease)    a. s/p DES to LAD (2014)  b. s/p LHC (09/2014) with patent LAD stent and stable dz from previous cath   . Chronic anemia   . CKD (chronic kidney disease), stage IV ([place])   . Claudication ([place])    R leg with bilateral femoral bruits.  . Dyslipidemia   . Essential hypertension, benign   . Myocardial infarction [facility])    nstemi  . Tobacco abuse     Past Surgical History:  Procedure Laterality Date  . Bilateral carpal tunnel release Bilateral   . CARDIAC CATHETERIZATION N/A 10/10/2014   Procedure: Left Heart Cath and Coronary Angiography;  Surgeon: Belva Crome, MD;  Location: [place] [facility];  Service: Cardiovascular;  Laterality: N/A;  . LEFT HEART CATHETERIZATION WITH CORONARY ANGIOGRAM N/A 02/09/2012   Procedure: LEFT HEART CATHETERIZATION WITH CORONARY ANGIOGRAM;  Surgeon: Peter M Martinique, MD; left main 10%, LAD 80%/95%, D1 patent, CFX 70% at OM 2, dCFX 90%, RCA 100% with L-R collaterals    . PERCUTANEOUS CORONARY STENT INTERVENTION (PCI-S) N/A 02/11/2012   Procedure: PERCUTANEOUS CORONARY STENT INTERVENTION  (PCI-S);  Surgeon: Burnell Blanks, MD; 3.0 x 28 mm Promus Premier DES to the mid LAD   . REPAIR OF PERFORATED ULCER      Current Outpatient Medications  Medication Sig Dispense Refill  . amLODipine (NORVASC) 10 MG tablet Take 10 mg by mouth daily.    Marland Kitchen aspirin EC 81 MG tablet Take 81 mg by mouth daily.    Marland Kitchen atorvastatin (LIPITOR) 80 MG tablet TAKE ONE TABLET BY MOUTH EVERY EVENING. 30 tablet 6  . Cholecalciferol (VITAMIN D-3) 1000 UNITS CAPS Take 1,000 Units by mouth daily.     . hydrALAZINE (APRESOLINE) 100 MG tablet TAKE ONE TABLET BY MOUTH THREE TIMES DAILY 90 tablet 6  . isosorbide mononitrate (IMDUR) 60 MG 24 hr tablet TAKE ONE TABLET BY MOUTH DAILY. 90 tablet 2  . loratadine (CLARITIN) 10 MG tablet Take 10 mg by mouth daily.  2  . metoprolol tartrate (LOPRESSOR) 25 MG tablet TAKE 1/2 TABLET BY MOUTH TWICE DAILY. 90 tablet 2  . nitroGLYCERIN (NITROSTAT) 0.4 MG SL tablet Place 1 tablet (0.4 mg total) under the tongue every 5 (five) minutes as needed for chest pain (up to 3 doses). 25 tablet 3  . Polysaccharide Iron Complex (POLY-IRON 150 PO) Take 150 mg by mouth daily.    . sodium bicarbonate 650 MG tablet Take 1 tablet (650 mg total) by mouth 2 (two) times daily. 60 tablet 0   No current facility-administered medications for this  visit.    Allergies:  Patient has no known allergies.   Social History: The patient  reports that he quit smoking about 5 years ago. His smoking use included cigarettes. He started smoking about 49 years ago. He has a 22.50 pack-year smoking history. He has never used smokeless tobacco. He reports that he does not drink alcohol or use drugs.   ROS:  Please see the history of present illness. Otherwise, complete review of systems is positive for none.  All other systems are reviewed and negative.   Physical Exam: VS:  BP (!) 158/64   Pulse 67   Ht 5\' 6"  (1.676 m)   Wt 201 lb (91.2 kg)   SpO2 98%   BMI 32.44 kg/m , BMI Body mass index is 32.44  kg/m.  Wt Readings from Last 3 Encounters:  12/21/17 201 lb (91.2 kg)  06/15/17 205 lb (93 kg)  12/16/16 202 lb (91.6 kg)    General: Elderly male, appears comfortable at rest. HEENT: Conjunctiva and lids normal, oropharynx clear. Neck: Supple, no elevated JVP or carotid bruits, no thyromegaly. Lungs: Clear to auscultation, nonlabored breathing at rest. Cardiac: Regular rate and rhythm, no S3 or significant systolic murmur. Abdomen: Soft, nontender, bowel sounds present. Extremities: Trace ankle edema, distal pulses 2+. Skin: Warm and dry. Musculoskeletal: No kyphosis. Neuropsychiatric: Alert and oriented x3, affect grossly appropriate.  ECG: I personally reviewed the tracing from 12/16/2016 which showed sinus bradycardia with short PR interval and diffuse nonspecific ST-T changes.  Recent Labwork:  July 2019: BUN 31, creatinine 1.92, potassium 5.1, hemoglobin 11.9  Other Studies Reviewed Today:  Echocardiogram 12/11/2015: Study Conclusions  - Left ventricle: The cavity size was normal. Wall thickness was increased in a pattern of mild LVH. Systolic function was normal. The estimated ejection fraction was in the range of 55% to 60%. Wall motion was normal; there were no regional wall motion abnormalities. Doppler parameters are consistent with abnormal left ventricular relaxation (grade 1 diastolic dysfunction). - Aortic valve: There was trivial regurgitation. - Mitral valve: There was mild regurgitation. - Left atrium: The atrium was moderately dilated. - Pulmonary arteries: Systolic pressure was mildly increased. PA peak pressure: 32 mm Hg (S).  Impressions:  - Normal LV systolic function; grade 1 diastolic dysfunction; trace AI; mild MR; moderate LAE; trace TR with mildly elevated pulmonary pressure.  Assessment and Plan:  1.  Multivessel CAD status post DES to the LAD in 2014 with known occlusion of the mid to distal RCA associated with  left-sided collaterals.  He also has residual severe distal circumflex disease that is being managed medically.  Fortunately, he reports no active angina on present medical regimen, no changes made today.  2.  Mixed hyperlipidemia on Lipitor, following with Dr. Woody Seller.  3.  Essential hypertension, keep follow-up with Dr. Woody Seller.  4.  CKD stage 3, last creatinine 1.9.  He follows with nephrology.  Current medicines were reviewed with the patient today.   Orders Placed This Encounter  Procedures  . EKG 12-Lead    Disposition: Follow-up in 6 months.  Signed, Satira Sark, MD, [facility] 12/21/2017 11:43 AM    [facility], [place], [place] [address] Phone: [phone]; Fax: [phone]

## 2017-12-21 ENCOUNTER — Encounter: Payer: Self-pay | Admitting: Cardiology

## 2017-12-21 ENCOUNTER — Ambulatory Visit (INDEPENDENT_AMBULATORY_CARE_PROVIDER_SITE_OTHER): Payer: Medicare Other | Admitting: Cardiology

## 2017-12-21 VITALS — BP 158/64 | HR 67 | Ht 66.0 in | Wt 201.0 lb

## 2017-12-21 DIAGNOSIS — N183 Chronic kidney disease, stage 3 unspecified: Secondary | ICD-10-CM

## 2017-12-21 DIAGNOSIS — E782 Mixed hyperlipidemia: Secondary | ICD-10-CM | POA: Diagnosis not present

## 2017-12-21 DIAGNOSIS — I10 Essential (primary) hypertension: Secondary | ICD-10-CM

## 2017-12-21 DIAGNOSIS — I25119 Atherosclerotic heart disease of native coronary artery with unspecified angina pectoris: Secondary | ICD-10-CM | POA: Diagnosis not present

## 2017-12-21 NOTE — Patient Instructions (Addendum)

## 2018-01-07 DIAGNOSIS — I129 Hypertensive chronic kidney disease with stage 1 through stage 4 chronic kidney disease, or unspecified chronic kidney disease: Secondary | ICD-10-CM | POA: Diagnosis not present

## 2018-01-07 DIAGNOSIS — Z79899 Other long term (current) drug therapy: Secondary | ICD-10-CM | POA: Diagnosis not present

## 2018-01-07 DIAGNOSIS — R809 Proteinuria, unspecified: Secondary | ICD-10-CM | POA: Diagnosis not present

## 2018-01-07 DIAGNOSIS — D509 Iron deficiency anemia, unspecified: Secondary | ICD-10-CM | POA: Diagnosis not present

## 2018-01-07 DIAGNOSIS — E559 Vitamin D deficiency, unspecified: Secondary | ICD-10-CM | POA: Diagnosis not present

## 2018-01-07 DIAGNOSIS — N183 Chronic kidney disease, stage 3 (moderate): Secondary | ICD-10-CM | POA: Diagnosis not present

## 2018-01-13 DIAGNOSIS — N183 Chronic kidney disease, stage 3 (moderate): Secondary | ICD-10-CM | POA: Diagnosis not present

## 2018-01-13 DIAGNOSIS — E872 Acidosis: Secondary | ICD-10-CM | POA: Diagnosis not present

## 2018-01-13 DIAGNOSIS — M9080 Osteopathy in diseases classified elsewhere, unspecified site: Secondary | ICD-10-CM | POA: Diagnosis not present

## 2018-01-13 DIAGNOSIS — I509 Heart failure, unspecified: Secondary | ICD-10-CM | POA: Diagnosis not present

## 2018-01-13 DIAGNOSIS — I10 Essential (primary) hypertension: Secondary | ICD-10-CM | POA: Diagnosis not present

## 2018-01-13 DIAGNOSIS — E889 Metabolic disorder, unspecified: Secondary | ICD-10-CM | POA: Diagnosis not present

## 2018-01-13 DIAGNOSIS — R809 Proteinuria, unspecified: Secondary | ICD-10-CM | POA: Diagnosis not present

## 2018-01-25 DIAGNOSIS — I10 Essential (primary) hypertension: Secondary | ICD-10-CM | POA: Diagnosis not present

## 2018-01-25 DIAGNOSIS — I2510 Atherosclerotic heart disease of native coronary artery without angina pectoris: Secondary | ICD-10-CM | POA: Diagnosis not present

## 2018-01-25 DIAGNOSIS — E7800 Pure hypercholesterolemia, unspecified: Secondary | ICD-10-CM | POA: Diagnosis not present

## 2018-02-22 DIAGNOSIS — I2510 Atherosclerotic heart disease of native coronary artery without angina pectoris: Secondary | ICD-10-CM | POA: Diagnosis not present

## 2018-02-22 DIAGNOSIS — E7800 Pure hypercholesterolemia, unspecified: Secondary | ICD-10-CM | POA: Diagnosis not present

## 2018-02-22 DIAGNOSIS — I10 Essential (primary) hypertension: Secondary | ICD-10-CM | POA: Diagnosis not present

## 2018-03-22 DIAGNOSIS — E7800 Pure hypercholesterolemia, unspecified: Secondary | ICD-10-CM | POA: Diagnosis not present

## 2018-03-22 DIAGNOSIS — I10 Essential (primary) hypertension: Secondary | ICD-10-CM | POA: Diagnosis not present

## 2018-03-22 DIAGNOSIS — I2510 Atherosclerotic heart disease of native coronary artery without angina pectoris: Secondary | ICD-10-CM | POA: Diagnosis not present

## 2018-04-20 DIAGNOSIS — E7800 Pure hypercholesterolemia, unspecified: Secondary | ICD-10-CM | POA: Diagnosis not present

## 2018-04-20 DIAGNOSIS — I10 Essential (primary) hypertension: Secondary | ICD-10-CM | POA: Diagnosis not present

## 2018-04-20 DIAGNOSIS — I2510 Atherosclerotic heart disease of native coronary artery without angina pectoris: Secondary | ICD-10-CM | POA: Diagnosis not present

## 2018-05-04 DIAGNOSIS — E7800 Pure hypercholesterolemia, unspecified: Secondary | ICD-10-CM | POA: Diagnosis not present

## 2018-05-04 DIAGNOSIS — I10 Essential (primary) hypertension: Secondary | ICD-10-CM | POA: Diagnosis not present

## 2018-05-04 DIAGNOSIS — I2510 Atherosclerotic heart disease of native coronary artery without angina pectoris: Secondary | ICD-10-CM | POA: Diagnosis not present

## 2018-06-02 DIAGNOSIS — E7800 Pure hypercholesterolemia, unspecified: Secondary | ICD-10-CM | POA: Diagnosis not present

## 2018-06-02 DIAGNOSIS — I10 Essential (primary) hypertension: Secondary | ICD-10-CM | POA: Diagnosis not present

## 2018-06-02 DIAGNOSIS — I2510 Atherosclerotic heart disease of native coronary artery without angina pectoris: Secondary | ICD-10-CM | POA: Diagnosis not present

## 2018-06-18 ENCOUNTER — Other Ambulatory Visit: Payer: Self-pay | Admitting: Cardiology

## 2018-06-24 ENCOUNTER — Telehealth: Payer: Self-pay | Admitting: *Deleted

## 2018-06-24 NOTE — Telephone Encounter (Signed)
Patient verbally consented for telehealth visits with [facility] and understands that his insurance company will be billed for the encounter.   Aware to have vitals available 

## 2018-06-27 NOTE — Progress Notes (Signed)
Virtual Visit via Telephone Note   This visit type was conducted due to national recommendations for restrictions regarding the COVID-19 Pandemic (e.g. social distancing) in an effort to limit this patient's exposure and mitigate transmission in our community.  Due to his co-morbid illnesses, this patient is at least at moderate risk for complications without adequate follow up.  This format is felt to be most appropriate for this patient at this time.  The patient did not have access to video technology/had technical difficulties with video requiring transitioning to audio format only (telephone).  All issues noted in this document were discussed and addressed.  No physical exam could be performed with this format.  Please refer to the patient's chart for his  consent to telehealth for [facility].   Date:  06/28/2018   ID:  Kevin [name], DOB [birth year], MRN [id number]  Patient Location: Home Provider Location: Office  PCP:  Glenda Chroman, MD  Cardiologist:  Rozann Lesches, MD Electrophysiologist:  None   Evaluation Performed:  Follow-Up Visit  Chief Complaint:   Cardiac follow-up  History of Present Illness:    Kevin [name] is an 85 y.o. male last seen in November 2019.  He did not have video access and we spoke by phone today.  Since last assessment he does not report any angina symptoms or nitroglycerin use.  He remains functional with ADLs around his house.  He does state that he has been social distancing, really only goes out to the grocery store and to ride around in his car just for change of scenery.  He continues to follow with Dr. Woody Seller.  Reports pending visit in August for lab work.  I reviewed his medications which are stable and outlined below.  The patient does not have symptoms concerning for COVID-19 infection (fever, chills, cough, or new shortness of breath).    Past Medical History:  Diagnosis Date  . Arthritis   . CAD (coronary artery disease)    a. s/p  DES to LAD (2014)  b. s/p LHC (09/2014) with patent LAD stent and stable dz from previous cath   . Chronic anemia   . CKD (chronic kidney disease), stage IV ([place])   . Claudication ([place])    R leg with bilateral femoral bruits.  . Dyslipidemia   . Essential hypertension, benign   . Myocardial infarction [facility])    nstemi  . Tobacco abuse    Past Surgical History:  Procedure Laterality Date  . Bilateral carpal tunnel release Bilateral   . CARDIAC CATHETERIZATION N/A 10/10/2014   Procedure: Left Heart Cath and Coronary Angiography;  Surgeon: Belva Crome, MD;  Location: [place] [facility];  Service: Cardiovascular;  Laterality: N/A;  . LEFT HEART CATHETERIZATION WITH CORONARY ANGIOGRAM N/A 02/09/2012   Procedure: LEFT HEART CATHETERIZATION WITH CORONARY ANGIOGRAM;  Surgeon: Peter M Martinique, MD; left main 10%, LAD 80%/95%, D1 patent, CFX 70% at OM 2, dCFX 90%, RCA 100% with L-R collaterals    . PERCUTANEOUS CORONARY STENT INTERVENTION (PCI-S) N/A 02/11/2012   Procedure: PERCUTANEOUS CORONARY STENT INTERVENTION (PCI-S);  Surgeon: Burnell Blanks, MD; 3.0 x 28 mm Promus Premier DES to the mid LAD   . REPAIR OF PERFORATED ULCER       Current Meds  Medication Sig  . amLODipine (NORVASC) 10 MG tablet Take 10 mg by mouth daily.  Marland Kitchen aspirin EC 81 MG tablet Take 81 mg by mouth daily.  Marland Kitchen atorvastatin (LIPITOR) 80 MG tablet TAKE ONE  TABLET BY MOUTH EVERY EVENING.  Marland Kitchen Cholecalciferol (VITAMIN D-3) 1000 UNITS CAPS Take 1,000 Units by mouth daily.   Marland Kitchen guaiFENesin (MUCINEX) 600 MG 12 hr tablet Take 600 mg by mouth daily.  . hydrALAZINE (APRESOLINE) 100 MG tablet TAKE ONE TABLET BY MOUTH THREE TIMES DAILY  . isosorbide mononitrate (IMDUR) 60 MG 24 hr tablet TAKE ONE TABLET BY MOUTH DAILY.  Marland Kitchen loratadine (CLARITIN) 10 MG tablet Take 10 mg by mouth daily.  . metoprolol tartrate (LOPRESSOR) 25 MG tablet TAKE 1/2 TABLET BY MOUTH TWICE DAILY.  . nitroGLYCERIN (NITROSTAT) 0.4 MG SL tablet Place 1 tablet (0.4  mg total) under the tongue every 5 (five) minutes x 3 doses as needed for chest pain (if no relief after 3rd dose, proceed to the ED for an evaluation or call 911).  . Polysaccharide Iron Complex (POLY-IRON 150 PO) Take 150 mg by mouth daily.  . sodium bicarbonate 650 MG tablet Take 1 tablet (650 mg total) by mouth 2 (two) times daily.  . [DISCONTINUED] nitroGLYCERIN (NITROSTAT) 0.4 MG SL tablet Place 1 tablet (0.4 mg total) under the tongue every 5 (five) minutes as needed for chest pain (up to 3 doses).     Allergies:   Patient has no known allergies.   Social History   Tobacco Use  . Smoking status: Former Smoker    Packs/day: 0.50    Years: 45.00    Pack years: 22.50    Types: Cigarettes    Start date: 01/28/1968    Last attempt to quit: 02/06/2012    Years since quitting: 6.3  . Smokeless tobacco: Never Used  Substance Use Topics  . Alcohol use: No    Alcohol/week: 0.0 standard drinks  . Drug use: No     Family Hx: The patient's family history includes Alzheimer's disease in his mother; Heart disease in his brother and sister; Hypertension in his mother; Other in his father.  ROS:   Please see the history of present illness. All other systems reviewed and are negative.   Prior CV studies:   The following studies were reviewed today:  Echocardiogram 12/11/2015: Study Conclusions  - Left ventricle: The cavity size was normal. Wall thickness was increased in a pattern of mild LVH. Systolic function was normal. The estimated ejection fraction was in the range of 55% to 60%. Wall motion was normal; there were no regional wall motion abnormalities. Doppler parameters are consistent with abnormal left ventricular relaxation (grade 1 diastolic dysfunction). - Aortic valve: There was trivial regurgitation. - Mitral valve: There was mild regurgitation. - Left atrium: The atrium was moderately dilated. - Pulmonary arteries: Systolic pressure was mildly increased. PA  peak pressure: 32 mm Hg (S).  Impressions:  - Normal LV systolic function; grade 1 diastolic dysfunction; trace AI; mild MR; moderate LAE; trace TR with mildly elevated pulmonary pressure.  Labs/Other Tests and Data Reviewed:     EKG:  An ECG dated 12/21/2017 was personally reviewed today and demonstrated:  Sinus rhythm with PVC and nonspecific ST-T changes.  Recent Labs:  July 2019: BUN 31, creatinine 1.92, potassium 5.1, hemoglobin 11.9  Wt Readings from Last 3 Encounters:  06/28/18 202 lb (91.6 kg)  12/21/17 201 lb (91.2 kg)  06/15/17 205 lb (93 kg)     Objective:    Vital Signs:  BP (!) 154/60   Ht 5\' 6"  (1.676 m)   Wt 202 lb (91.6 kg)   BMI 32.60 kg/m    Patient spoke in full sentences, not  short of breath. No audible wheezing or coughing. Speech pattern normal.  ASSESSMENT & PLAN:    1.  Multivessel CAD with history of DES to the LAD in 2014, known occlusion of the mid to distal RCA associated with left-to-right collaterals, and severe circumflex disease that is being managed medically.  He is symptomatically stable.  We will continue with observation, refill provided for nitroglycerin.  2.  Mixed hyperlipidemia on Lipitor.  He is due for follow-up with Dr. Woody Seller in August.  3.  Essential hypertension, no changes made to current regimen.  Keep follow-up with Dr. Woody Seller.  4.  CKD stage 3, he continues to follow with nephrology December 2019. Creatinine generally ranges from 1.5-2.0.  COVID-19 Education: The signs and symptoms of COVID-19 were discussed with the patient and how to seek care for testing (follow up with PCP or arrange E-visit).  The importance of social distancing was discussed today.  Time:   Today, I have spent 6 minutes with the patient with telehealth technology discussing the above problems.     Medication Adjustments/Labs and Tests Ordered: Current medicines are reviewed at length with the patient today.  Concerns regarding medicines  are outlined above.   Tests Ordered: No orders of the defined types were placed in this encounter.   Medication Changes: Meds ordered this encounter  Medications  . nitroGLYCERIN (NITROSTAT) 0.4 MG SL tablet    Sig: Place 1 tablet (0.4 mg total) under the tongue every 5 (five) minutes x 3 doses as needed for chest pain (if no relief after 3rd dose, proceed to the ED for an evaluation or call 911).    Dispense:  25 tablet    Refill:  3    Disposition:  Follow up 6 months in the [place] office.  Signed, Rozann Lesches, MD  06/28/2018 1:49 PM    [facility]

## 2018-06-28 ENCOUNTER — Telehealth (INDEPENDENT_AMBULATORY_CARE_PROVIDER_SITE_OTHER): Payer: Medicare HMO | Admitting: Cardiology

## 2018-06-28 ENCOUNTER — Encounter: Payer: Self-pay | Admitting: Cardiology

## 2018-06-28 VITALS — BP 154/60 | Ht 66.0 in | Wt 202.0 lb

## 2018-06-28 DIAGNOSIS — E782 Mixed hyperlipidemia: Secondary | ICD-10-CM | POA: Diagnosis not present

## 2018-06-28 DIAGNOSIS — I10 Essential (primary) hypertension: Secondary | ICD-10-CM | POA: Diagnosis not present

## 2018-06-28 DIAGNOSIS — I25119 Atherosclerotic heart disease of native coronary artery with unspecified angina pectoris: Secondary | ICD-10-CM | POA: Diagnosis not present

## 2018-06-28 DIAGNOSIS — Z7189 Other specified counseling: Secondary | ICD-10-CM

## 2018-06-28 DIAGNOSIS — N183 Chronic kidney disease, stage 3 unspecified: Secondary | ICD-10-CM

## 2018-06-28 MED ORDER — NITROGLYCERIN 0.4 MG SL SUBL: 0.4000 mg | SUBLINGUAL_TABLET | SUBLINGUAL | 3 refills | Status: DC | PRN

## 2018-06-28 NOTE — Patient Instructions (Addendum)

## 2018-06-30 DIAGNOSIS — I10 Essential (primary) hypertension: Secondary | ICD-10-CM | POA: Diagnosis not present

## 2018-06-30 DIAGNOSIS — I2510 Atherosclerotic heart disease of native coronary artery without angina pectoris: Secondary | ICD-10-CM | POA: Diagnosis not present

## 2018-06-30 DIAGNOSIS — E7800 Pure hypercholesterolemia, unspecified: Secondary | ICD-10-CM | POA: Diagnosis not present

## 2018-07-15 ENCOUNTER — Other Ambulatory Visit: Payer: Self-pay | Admitting: Cardiology

## 2018-08-03 DIAGNOSIS — I2510 Atherosclerotic heart disease of native coronary artery without angina pectoris: Secondary | ICD-10-CM | POA: Diagnosis not present

## 2018-08-03 DIAGNOSIS — E7800 Pure hypercholesterolemia, unspecified: Secondary | ICD-10-CM | POA: Diagnosis not present

## 2018-08-03 DIAGNOSIS — I10 Essential (primary) hypertension: Secondary | ICD-10-CM | POA: Diagnosis not present

## 2018-09-01 DIAGNOSIS — I2510 Atherosclerotic heart disease of native coronary artery without angina pectoris: Secondary | ICD-10-CM | POA: Diagnosis not present

## 2018-09-01 DIAGNOSIS — I10 Essential (primary) hypertension: Secondary | ICD-10-CM | POA: Diagnosis not present

## 2018-09-01 DIAGNOSIS — E7800 Pure hypercholesterolemia, unspecified: Secondary | ICD-10-CM | POA: Diagnosis not present

## 2018-10-05 DIAGNOSIS — I2510 Atherosclerotic heart disease of native coronary artery without angina pectoris: Secondary | ICD-10-CM | POA: Diagnosis not present

## 2018-10-05 DIAGNOSIS — E7800 Pure hypercholesterolemia, unspecified: Secondary | ICD-10-CM | POA: Diagnosis not present

## 2018-10-05 DIAGNOSIS — I10 Essential (primary) hypertension: Secondary | ICD-10-CM | POA: Diagnosis not present

## 2018-10-06 DIAGNOSIS — I10 Essential (primary) hypertension: Secondary | ICD-10-CM | POA: Diagnosis not present

## 2018-10-06 DIAGNOSIS — Z1339 Encounter for screening examination for other mental health and behavioral disorders: Secondary | ICD-10-CM | POA: Diagnosis not present

## 2018-10-06 DIAGNOSIS — Z79899 Other long term (current) drug therapy: Secondary | ICD-10-CM | POA: Diagnosis not present

## 2018-10-06 DIAGNOSIS — Z6832 Body mass index (BMI) 32.0-32.9, adult: Secondary | ICD-10-CM | POA: Diagnosis not present

## 2018-10-06 DIAGNOSIS — E7800 Pure hypercholesterolemia, unspecified: Secondary | ICD-10-CM | POA: Diagnosis not present

## 2018-10-06 DIAGNOSIS — Z7189 Other specified counseling: Secondary | ICD-10-CM | POA: Diagnosis not present

## 2018-10-06 DIAGNOSIS — N183 Chronic kidney disease, stage 3 (moderate): Secondary | ICD-10-CM | POA: Diagnosis not present

## 2018-10-06 DIAGNOSIS — I509 Heart failure, unspecified: Secondary | ICD-10-CM | POA: Diagnosis not present

## 2018-10-06 DIAGNOSIS — Z1331 Encounter for screening for depression: Secondary | ICD-10-CM | POA: Diagnosis not present

## 2018-10-06 DIAGNOSIS — I130 Hypertensive heart and chronic kidney disease with heart failure and stage 1 through stage 4 chronic kidney disease, or unspecified chronic kidney disease: Secondary | ICD-10-CM | POA: Diagnosis not present

## 2018-10-06 DIAGNOSIS — Z299 Encounter for prophylactic measures, unspecified: Secondary | ICD-10-CM | POA: Diagnosis not present

## 2018-10-06 DIAGNOSIS — Z125 Encounter for screening for malignant neoplasm of prostate: Secondary | ICD-10-CM | POA: Diagnosis not present

## 2018-10-06 DIAGNOSIS — R5383 Other fatigue: Secondary | ICD-10-CM | POA: Diagnosis not present

## 2018-10-06 DIAGNOSIS — Z0000 Encounter for general adult medical examination without abnormal findings: Secondary | ICD-10-CM | POA: Diagnosis not present

## 2018-10-06 DIAGNOSIS — Z1211 Encounter for screening for malignant neoplasm of colon: Secondary | ICD-10-CM | POA: Diagnosis not present

## 2018-11-04 ENCOUNTER — Other Ambulatory Visit: Payer: Self-pay | Admitting: Cardiology

## 2018-11-04 DIAGNOSIS — I10 Essential (primary) hypertension: Secondary | ICD-10-CM | POA: Diagnosis not present

## 2018-11-04 DIAGNOSIS — E7800 Pure hypercholesterolemia, unspecified: Secondary | ICD-10-CM | POA: Diagnosis not present

## 2018-11-04 DIAGNOSIS — I2510 Atherosclerotic heart disease of native coronary artery without angina pectoris: Secondary | ICD-10-CM | POA: Diagnosis not present

## 2018-12-13 DIAGNOSIS — I10 Essential (primary) hypertension: Secondary | ICD-10-CM | POA: Diagnosis not present

## 2018-12-13 DIAGNOSIS — E7800 Pure hypercholesterolemia, unspecified: Secondary | ICD-10-CM | POA: Diagnosis not present

## 2018-12-13 DIAGNOSIS — I2510 Atherosclerotic heart disease of native coronary artery without angina pectoris: Secondary | ICD-10-CM | POA: Diagnosis not present

## 2019-01-04 ENCOUNTER — Encounter: Payer: Self-pay | Admitting: Cardiology

## 2019-01-04 ENCOUNTER — Encounter: Payer: Self-pay | Admitting: *Deleted

## 2019-01-04 NOTE — Progress Notes (Signed)
Virtual Visit via Telephone Note   This visit type was conducted due to national recommendations for restrictions regarding the COVID-19 Pandemic (e.g. social distancing) in an effort to limit this patient's exposure and mitigate transmission in our community.  Due to his co-morbid illnesses, this patient is at least at moderate risk for complications without adequate follow up.  This format is felt to be most appropriate for this patient at this time.  The patient did not have access to video technology/had technical difficulties with video requiring transitioning to audio format only (telephone).  All issues noted in this document were discussed and addressed.  No physical exam could be performed with this format.  Please refer to the patient's chart for his  consent to telehealth for [facility].   Date:  01/05/2019   ID:  Kevin [name], DOB 11/11/1930, MRN [id number]  Patient Location: Home Provider Location: Office  PCP:  Glenda Chroman, MD  Cardiologist:  Rozann Lesches, MD Electrophysiologist:  None   Evaluation Performed:  Follow-Up Visit  Chief Complaint:  Cardiac follow-up  History of Present Illness:    Kevin [name] is an 85 y.o. male last assessed via telehealth encounter in June.  We spoke by phone today.  He tells me that he has had no significant angina or nitroglycerin use since last assessment.  He has been staying at home mostly during the pandemic, his family did not get together for Thanksgiving.  He wears a mask when he goes out shopping.  I reviewed his medications which are outlined below.  He ran out of Lipitor somewhere along the way, this will be refilled.  He states that he had lab work done recently with Dr. Woody Seller which are requesting.  Otherwise, cardiac regimen includes aspirin, Norvasc, hydralazine, Imdur, Lopressor, and as needed nitroglycerin.  The patient does not have symptoms concerning for COVID-19 infection (fever, chills, cough, or new shortness of  breath).    Past Medical History:  Diagnosis Date  . Arthritis   . CAD (coronary artery disease)    a. s/p DES to LAD (2014)  b. s/p LHC (09/2014) with patent LAD stent and stable dz from previous cath   . Chronic anemia   . CKD (chronic kidney disease), stage IV ([place])   . Claudication ([place])    R leg with bilateral femoral bruits.  . Essential hypertension   . Hyperlipidemia   . NSTEMI (non-ST elevated myocardial infarction) [facility])    Past Surgical History:  Procedure Laterality Date  . Bilateral carpal tunnel release Bilateral   . CARDIAC CATHETERIZATION N/A 10/10/2014   Procedure: Left Heart Cath and Coronary Angiography;  Surgeon: Belva Crome, MD;  Location: [place] [facility];  Service: Cardiovascular;  Laterality: N/A;  . LEFT HEART CATHETERIZATION WITH CORONARY ANGIOGRAM N/A 02/09/2012   Procedure: LEFT HEART CATHETERIZATION WITH CORONARY ANGIOGRAM;  Surgeon: Peter M Martinique, MD; left main 10%, LAD 80%/95%, D1 patent, CFX 70% at OM 2, dCFX 90%, RCA 100% with L-R collaterals    . PERCUTANEOUS CORONARY STENT INTERVENTION (PCI-S) N/A 02/11/2012   Procedure: PERCUTANEOUS CORONARY STENT INTERVENTION (PCI-S);  Surgeon: Burnell Blanks, MD; 3.0 x 28 mm Promus Premier DES to the mid LAD   . REPAIR OF PERFORATED ULCER       Current Meds  Medication Sig  . amLODipine (NORVASC) 10 MG tablet Take 10 mg by mouth daily.  Marland Kitchen aspirin EC 81 MG tablet Take 81 mg by mouth daily.  . Cholecalciferol (  VITAMIN D-3) 1000 UNITS CAPS Take 1,000 Units by mouth daily.   Marland Kitchen guaiFENesin (MUCINEX) 600 MG 12 hr tablet Take 600 mg by mouth daily.  . hydrALAZINE (APRESOLINE) 100 MG tablet TAKE ONE TABLET BY MOUTH THREE TIMES DAILY  . isosorbide mononitrate (IMDUR) 60 MG 24 hr tablet TAKE ONE TABLET BY MOUTH DAILY.  Marland Kitchen loratadine (CLARITIN) 10 MG tablet Take 10 mg by mouth daily.  . metoprolol tartrate (LOPRESSOR) 25 MG tablet TAKE 1/2 TABLET BY MOUTH TWICE DAILY.  . nitroGLYCERIN (NITROSTAT) 0.4 MG SL  tablet Place 1 tablet (0.4 mg total) under the tongue every 5 (five) minutes x 3 doses as needed for chest pain (if no relief after 3rd dose, proceed to the ED for an evaluation or call 911).  . Polysaccharide Iron Complex (POLY-IRON 150 PO) Take 150 mg by mouth daily.  . sodium bicarbonate 650 MG tablet Take 1 tablet (650 mg total) by mouth 2 (two) times daily.     Allergies:   Patient has no known allergies.   Social History   Tobacco Use  . Smoking status: Former Smoker    Packs/day: 0.50    Years: 45.00    Pack years: 22.50    Types: Cigarettes    Start date: 01/28/1968    Quit date: 02/06/2012    Years since quitting: 6.9  . Smokeless tobacco: Never Used  Substance Use Topics  . Alcohol use: No    Alcohol/week: 0.0 standard drinks  . Drug use: No     Family Hx: The patient's family history includes Alzheimer's disease in his mother; Heart disease in his brother and sister; Hypertension in his mother; Other in his father.  ROS:   Please see the history of present illness. All other systems reviewed and are negative.   Prior CV studies:   The following studies were reviewed today:  Echocardiogram 12/11/2015: Study Conclusions  - Left ventricle: The cavity size was normal. Wall thickness was increased in a pattern of mild LVH. Systolic function was normal. The estimated ejection fraction was in the range of 55% to 60%. Wall motion was normal; there were no regional wall motion abnormalities. Doppler parameters are consistent with abnormal left ventricular relaxation (grade 1 diastolic dysfunction). - Aortic valve: There was trivial regurgitation. - Mitral valve: There was mild regurgitation. - Left atrium: The atrium was moderately dilated. - Pulmonary arteries: Systolic pressure was mildly increased. PA peak pressure: 32 mm Hg (S).  Impressions:  - Normal LV systolic function; grade 1 diastolic dysfunction; trace AI; mild MR; moderate LAE; trace  TR with mildly elevated pulmonary pressure.  Labs/Other Tests and Data Reviewed:    EKG:  An ECG dated 12/21/2017 was personally reviewed today and demonstrated:  Sinus rhythm with PVC and nonspecific ST-T changes.  Recent Labs:  July 2019: BUN 31, creatinine 1.92, potassium 5.1, hemoglobin 11.9  Wt Readings from Last 3 Encounters:  01/05/19 202 lb (91.6 kg)  06/28/18 202 lb (91.6 kg)  12/21/17 201 lb (91.2 kg)     Objective:    Vital Signs:  BP (!) 154/60   Pulse 69   Ht 5\' 6"  (1.676 m)   Wt 202 lb (91.6 kg)   BMI 32.60 kg/m    Patient spoke in full sentences, not short of breath. No audible wheezing or coughing. Speech pattern normal.  ASSESSMENT & PLAN:    1.  Multivessel CAD status post DES to the LAD in 2014, occlusion of the mid to distal RCA associated  with left-to-right collaterals, and severe circumflex stenosis that has been managed medically.  He does not report any progressive angina or nitroglycerin use.  Plan to continue medical therapy which includes aspirin, Lipitor, Imdur, Lopressor, and hydralazine.  He has a fresh bottle of nitroglycerin.  2.  Mixed hyperlipidemia, he ran out of Lipitor and this will be refilled.  Requesting recent lab work from PCP.  3.  Essential hypertension, blood pressure is elevated today.  He reports compliance with his medications.  Encouraged regular follow-up with PCP.  4.  CKD stage III, creatinine 1.92 as of lab work from last year.  Requesting interval follow-up lab work from PCP.  COVID-19 Education: The signs and symptoms of COVID-19 were discussed with the patient and how to seek care for testing (follow up with PCP or arrange E-visit).  The importance of social distancing was discussed today.  Time:   Today, I have spent 6 minutes with the patient with telehealth technology discussing the above problems.     Medication Adjustments/Labs and Tests Ordered: Current medicines are reviewed at length with the patient  today.  Concerns regarding medicines are outlined above.   Tests Ordered: No orders of the defined types were placed in this encounter.   Medication Changes: Meds ordered this encounter  Medications  . atorvastatin (LIPITOR) 80 MG tablet    Sig: Take 1 tablet (80 mg total) by mouth every evening.    Dispense:  90 tablet    Refill:  3    Follow Up:  In Person 6 months in the [place] office.  Signed, Rozann Lesches, MD  01/05/2019 8:57 AM    [place]

## 2019-01-05 ENCOUNTER — Encounter: Payer: Self-pay | Admitting: *Deleted

## 2019-01-05 ENCOUNTER — Telehealth (INDEPENDENT_AMBULATORY_CARE_PROVIDER_SITE_OTHER): Payer: Medicare HMO | Admitting: Cardiology

## 2019-01-05 ENCOUNTER — Encounter: Payer: Self-pay | Admitting: Cardiology

## 2019-01-05 VITALS — BP 154/60 | HR 69 | Ht 66.0 in | Wt 202.0 lb

## 2019-01-05 DIAGNOSIS — Z79899 Other long term (current) drug therapy: Secondary | ICD-10-CM

## 2019-01-05 DIAGNOSIS — I2510 Atherosclerotic heart disease of native coronary artery without angina pectoris: Secondary | ICD-10-CM | POA: Diagnosis not present

## 2019-01-05 DIAGNOSIS — I129 Hypertensive chronic kidney disease with stage 1 through stage 4 chronic kidney disease, or unspecified chronic kidney disease: Secondary | ICD-10-CM

## 2019-01-05 DIAGNOSIS — N183 Chronic kidney disease, stage 3 unspecified: Secondary | ICD-10-CM | POA: Diagnosis not present

## 2019-01-05 DIAGNOSIS — Z7982 Long term (current) use of aspirin: Secondary | ICD-10-CM | POA: Diagnosis not present

## 2019-01-05 DIAGNOSIS — N1832 Chronic kidney disease, stage 3b: Secondary | ICD-10-CM

## 2019-01-05 DIAGNOSIS — I10 Essential (primary) hypertension: Secondary | ICD-10-CM

## 2019-01-05 DIAGNOSIS — E782 Mixed hyperlipidemia: Secondary | ICD-10-CM

## 2019-01-05 DIAGNOSIS — Z955 Presence of coronary angioplasty implant and graft: Secondary | ICD-10-CM

## 2019-01-05 DIAGNOSIS — I25119 Atherosclerotic heart disease of native coronary artery with unspecified angina pectoris: Secondary | ICD-10-CM

## 2019-01-05 MED ORDER — ATORVASTATIN CALCIUM 80 MG PO TABS: 80.0000 mg | ORAL_TABLET | Freq: Every evening | ORAL | 3 refills | Status: AC

## 2019-01-05 NOTE — Patient Instructions (Addendum)
Medication Instructions:   Please start taking the Atorvastain (Lipitor) 80mg  daily for cholesterol.  A new refill has been sent to [facility] today.   Continue all other medications.    Labwork: none  Testing/Procedures: none  Follow-Up: 6 months   Any Other Special Instructions Will Be Listed Below (If Applicable).  If you need a refill on your cardiac medications before your next appointment, please call your pharmacy.

## 2019-01-11 DIAGNOSIS — I2510 Atherosclerotic heart disease of native coronary artery without angina pectoris: Secondary | ICD-10-CM | POA: Diagnosis not present

## 2019-01-11 DIAGNOSIS — I10 Essential (primary) hypertension: Secondary | ICD-10-CM | POA: Diagnosis not present

## 2019-01-11 DIAGNOSIS — E7800 Pure hypercholesterolemia, unspecified: Secondary | ICD-10-CM | POA: Diagnosis not present

## 2019-02-09 DIAGNOSIS — I2510 Atherosclerotic heart disease of native coronary artery without angina pectoris: Secondary | ICD-10-CM | POA: Diagnosis not present

## 2019-02-09 DIAGNOSIS — I10 Essential (primary) hypertension: Secondary | ICD-10-CM | POA: Diagnosis not present

## 2019-02-09 DIAGNOSIS — E7800 Pure hypercholesterolemia, unspecified: Secondary | ICD-10-CM | POA: Diagnosis not present

## 2019-04-13 DIAGNOSIS — I10 Essential (primary) hypertension: Secondary | ICD-10-CM | POA: Diagnosis not present

## 2019-04-13 DIAGNOSIS — I2510 Atherosclerotic heart disease of native coronary artery without angina pectoris: Secondary | ICD-10-CM | POA: Diagnosis not present

## 2019-04-13 DIAGNOSIS — E7800 Pure hypercholesterolemia, unspecified: Secondary | ICD-10-CM | POA: Diagnosis not present

## 2019-05-23 DIAGNOSIS — I10 Essential (primary) hypertension: Secondary | ICD-10-CM | POA: Diagnosis not present

## 2019-05-23 DIAGNOSIS — E7800 Pure hypercholesterolemia, unspecified: Secondary | ICD-10-CM | POA: Diagnosis not present

## 2019-05-23 DIAGNOSIS — I2510 Atherosclerotic heart disease of native coronary artery without angina pectoris: Secondary | ICD-10-CM | POA: Diagnosis not present

## 2019-06-20 ENCOUNTER — Other Ambulatory Visit: Payer: Self-pay | Admitting: Cardiology

## 2019-06-26 DIAGNOSIS — I2510 Atherosclerotic heart disease of native coronary artery without angina pectoris: Secondary | ICD-10-CM | POA: Diagnosis not present

## 2019-06-26 DIAGNOSIS — I10 Essential (primary) hypertension: Secondary | ICD-10-CM | POA: Diagnosis not present

## 2019-06-26 DIAGNOSIS — E7800 Pure hypercholesterolemia, unspecified: Secondary | ICD-10-CM | POA: Diagnosis not present

## 2019-07-08 ENCOUNTER — Ambulatory Visit (INDEPENDENT_AMBULATORY_CARE_PROVIDER_SITE_OTHER): Payer: Medicare Other | Admitting: Cardiology

## 2019-07-08 ENCOUNTER — Other Ambulatory Visit: Payer: Self-pay

## 2019-07-08 ENCOUNTER — Encounter: Payer: Self-pay | Admitting: Cardiology

## 2019-07-08 VITALS — BP 122/50 | HR 60 | Ht 66.0 in | Wt 206.4 lb

## 2019-07-08 DIAGNOSIS — E782 Mixed hyperlipidemia: Secondary | ICD-10-CM

## 2019-07-08 DIAGNOSIS — N1832 Chronic kidney disease, stage 3b: Secondary | ICD-10-CM | POA: Diagnosis not present

## 2019-07-08 DIAGNOSIS — I25119 Atherosclerotic heart disease of native coronary artery with unspecified angina pectoris: Secondary | ICD-10-CM

## 2019-07-08 NOTE — Progress Notes (Signed)
Cardiology Office Note  Date: 07/08/2019   ID: Kevin [name], DOB [birth year], MRN [id number]  PCP:  Glenda Chroman, MD  Cardiologist:  Rozann Lesches, MD Electrophysiologist:  None   Chief Complaint  Patient presents with  . Cardiac follow-up    History of Present Illness: Kevin [name] is an 86 y.o. male last assessed via telehealth encounter in December 2020.  He presents for a routine visit.  Still doing well without any significant angina symptoms or nitroglycerin use.  He remains functional with basic ADLs.  He hopes to get down to [facility] with his family this summer.  I reviewed his medications which are outlined below.  Cardiac regimen is stable.  I personally reviewed his ECG today which shows sinus rhythm with intermittent ectopic atrial beats, low voltage, nonspecific T wave changes.  Past Medical History:  Diagnosis Date  . Arthritis   . CAD (coronary artery disease)    a. s/p DES to LAD (2014)  b. s/p LHC (09/2014) with patent LAD stent and stable dz from previous cath   . Chronic anemia   . CKD (chronic kidney disease), stage IV ([place])   . Claudication ([place])    R leg with bilateral femoral bruits.  . Essential hypertension   . Hyperlipidemia   . NSTEMI (non-ST elevated myocardial infarction) [facility])     Past Surgical History:  Procedure Laterality Date  . Bilateral carpal tunnel release Bilateral   . CARDIAC CATHETERIZATION N/A 10/10/2014   Procedure: Left Heart Cath and Coronary Angiography;  Surgeon: Belva Crome, MD;  Location: [place] [facility];  Service: Cardiovascular;  Laterality: N/A;  . LEFT HEART CATHETERIZATION WITH CORONARY ANGIOGRAM N/A 02/09/2012   Procedure: LEFT HEART CATHETERIZATION WITH CORONARY ANGIOGRAM;  Surgeon: Peter M Martinique, MD; left main 10%, LAD 80%/95%, D1 patent, CFX 70% at OM 2, dCFX 90%, RCA 100% with L-R collaterals    . PERCUTANEOUS CORONARY STENT INTERVENTION (PCI-S) N/A 02/11/2012   Procedure: PERCUTANEOUS CORONARY  STENT INTERVENTION (PCI-S);  Surgeon: Burnell Blanks, MD; 3.0 x 28 mm Promus Premier DES to the mid LAD   . REPAIR OF PERFORATED ULCER      Current Outpatient Medications  Medication Sig Dispense Refill  . amLODipine (NORVASC) 10 MG tablet Take 10 mg by mouth daily.    Marland Kitchen aspirin EC 81 MG tablet Take 81 mg by mouth daily.    Marland Kitchen atorvastatin (LIPITOR) 80 MG tablet Take 1 tablet (80 mg total) by mouth every evening. 90 tablet 3  . Cholecalciferol (VITAMIN D-3) 1000 UNITS CAPS Take 1,000 Units by mouth daily.     Marland Kitchen guaiFENesin (MUCINEX) 600 MG 12 hr tablet Take 600 mg by mouth daily.    . hydrALAZINE (APRESOLINE) 100 MG tablet TAKE ONE TABLET BY MOUTH THREE TIMES DAILY 90 tablet 6  . isosorbide mononitrate (IMDUR) 60 MG 24 hr tablet TAKE ONE TABLET BY MOUTH DAILY. 90 tablet 3  . loratadine (CLARITIN) 10 MG tablet Take 10 mg by mouth daily.  2  . metoprolol tartrate (LOPRESSOR) 25 MG tablet TAKE 1/2 TABLET BY MOUTH TWICE DAILY. 90 tablet 2  . nitroGLYCERIN (NITROSTAT) 0.4 MG SL tablet Place 1 tablet (0.4 mg total) under the tongue every 5 (five) minutes x 3 doses as needed for chest pain (if no relief after 3rd dose, proceed to the ED for an evaluation or call 911). 25 tablet 3  . Polysaccharide Iron Complex (POLY-IRON 150 PO) Take 150 mg by  mouth daily.    . sodium bicarbonate 650 MG tablet Take 1 tablet (650 mg total) by mouth 2 (two) times daily. 60 tablet 0   No current facility-administered medications for this visit.   Allergies:  Patient has no known allergies.   ROS:  Stable arthritic symptoms, uses a cane.   Physical Exam: VS:  BP (!) 122/50   Pulse 60   Ht 5\' 6"  (1.676 m)   Wt 206 lb 6.4 oz (93.6 kg)   SpO2 98%   BMI 33.31 kg/m , BMI Body mass index is 33.31 kg/m.  Wt Readings from Last 3 Encounters:  07/08/19 206 lb 6.4 oz (93.6 kg)  01/05/19 202 lb (91.6 kg)  06/28/18 202 lb (91.6 kg)    General:  Elderly male, appears comfortable at rest. HEENT: Conjunctiva and  lids normal, wearing a mask. Neck: Supple, no elevated JVP or carotid bruits, no thyromegaly. Lungs: Clear to auscultation, nonlabored breathing at rest. Cardiac: Regular rate and rhythm, no S3 or significant systolic murmur. Abdomen: Soft, nontender, bowel sounds present. Extremities: Trace ankle edema, distal pulses 2+. Skin: Warm and dry. Musculoskeletal: No kyphosis. Neuropsychiatric: Alert and oriented x3, affect grossly appropriate.  ECG:  An ECG dated 12/21/2017 was personally reviewed today and demonstrated:  Sinus rhythm with PVC and nonspecific ST-T changes.  Recent Labwork:  September 2020: Hemoglobin 11.5, platelets 192, TSH 4.17, cholesterol 207, triglycerides 110, HDL 48, LDL 139, BUN 24, creatinine 1.96, potassium 4.0, AST 15, ALT 9  Other Studies Reviewed Today:  Echocardiogram 12/11/2015: Study Conclusions  - Left ventricle: The cavity size was normal. Wall thickness was increased in a pattern of mild LVH. Systolic function was normal. The estimated ejection fraction was in the range of 55% to 60%. Wall motion was normal; there were no regional wall motion abnormalities. Doppler parameters are consistent with abnormal left ventricular relaxation (grade 1 diastolic dysfunction). - Aortic valve: There was trivial regurgitation. - Mitral valve: There was mild regurgitation. - Left atrium: The atrium was moderately dilated. - Pulmonary arteries: Systolic pressure was mildly increased. PA peak pressure: 32 mm Hg (S).  Impressions:  - Normal LV systolic function; grade 1 diastolic dysfunction; trace AI; mild MR; moderate LAE; trace TR with mildly elevated pulmonary pressure.  Assessment and Plan:  1.  Multivessel CAD status post DES to the LAD in 2014 with known occlusion of the mid to distal RCA associated with left-to-right collaterals and severe circumflex disease.  He remains symptomatically stable on medical therapy and we will continue  observation.  ECG reviewed.  Continue aspirin, Norvasc, Lipitor, Imdur, Lopressor, and as needed nitroglycerin.  2.,  States that he has been compliant with Lipitor since we resumed it at last visit.  He will have lab work with PCP later this year and hopefully LDL has improved.  Last check LDL was 139, goal 70 or less.  3.  Essential hypertension, blood pressure is very well controlled today no changes made in current regimen.  4.  CKD stage IIIb, last creatinine 1.96.  Medication Adjustments/Labs and Tests Ordered: Current medicines are reviewed at length with the patient today.  Concerns regarding medicines are outlined above.   Tests Ordered: Orders Placed This Encounter  Procedures  . EKG 12-Lead    Medication Changes: No orders of the defined types were placed in this encounter.   Disposition:  Follow up 6 months in the [place] office.  Signed, Satira Sark, MD, [facility] 07/08/2019 9:35 AM    [facility] at [place], [place], [place] [address] Phone: [phone]; Fax: [phone]

## 2019-07-08 NOTE — Patient Instructions (Addendum)
Medication Instructions:    Your physician recommends that you continue on your current medications as directed. Please refer to the Current Medication list given to you today.  Labwork:  NONE  Testing/Procedures:  NONE  Follow-Up:  Your physician recommends that you schedule a follow-up appointment in: 6 months (office)  Any Other Special Instructions Will Be Listed Below (If Applicable).  If you need a refill on your cardiac medications before your next appointment, please call your pharmacy. 

## 2019-07-27 DIAGNOSIS — I2510 Atherosclerotic heart disease of native coronary artery without angina pectoris: Secondary | ICD-10-CM | POA: Diagnosis not present

## 2019-07-27 DIAGNOSIS — I10 Essential (primary) hypertension: Secondary | ICD-10-CM | POA: Diagnosis not present

## 2019-07-27 DIAGNOSIS — E7800 Pure hypercholesterolemia, unspecified: Secondary | ICD-10-CM | POA: Diagnosis not present

## 2019-08-26 DIAGNOSIS — I10 Essential (primary) hypertension: Secondary | ICD-10-CM | POA: Diagnosis not present

## 2019-08-26 DIAGNOSIS — E7800 Pure hypercholesterolemia, unspecified: Secondary | ICD-10-CM | POA: Diagnosis not present

## 2019-08-26 DIAGNOSIS — I2510 Atherosclerotic heart disease of native coronary artery without angina pectoris: Secondary | ICD-10-CM | POA: Diagnosis not present

## 2019-09-09 DIAGNOSIS — I2510 Atherosclerotic heart disease of native coronary artery without angina pectoris: Secondary | ICD-10-CM | POA: Diagnosis not present

## 2019-09-09 DIAGNOSIS — E7800 Pure hypercholesterolemia, unspecified: Secondary | ICD-10-CM | POA: Diagnosis not present

## 2019-09-09 DIAGNOSIS — I10 Essential (primary) hypertension: Secondary | ICD-10-CM | POA: Diagnosis not present

## 2019-09-16 ENCOUNTER — Other Ambulatory Visit: Payer: Self-pay | Admitting: Cardiology

## 2019-10-11 DIAGNOSIS — I10 Essential (primary) hypertension: Secondary | ICD-10-CM | POA: Diagnosis not present

## 2019-10-11 DIAGNOSIS — Z7189 Other specified counseling: Secondary | ICD-10-CM | POA: Diagnosis not present

## 2019-10-11 DIAGNOSIS — Z79899 Other long term (current) drug therapy: Secondary | ICD-10-CM | POA: Diagnosis not present

## 2019-10-11 DIAGNOSIS — Z0000 Encounter for general adult medical examination without abnormal findings: Secondary | ICD-10-CM | POA: Diagnosis not present

## 2019-10-11 DIAGNOSIS — Z299 Encounter for prophylactic measures, unspecified: Secondary | ICD-10-CM | POA: Diagnosis not present

## 2019-10-11 DIAGNOSIS — Z789 Other specified health status: Secondary | ICD-10-CM | POA: Diagnosis not present

## 2019-10-11 DIAGNOSIS — E7800 Pure hypercholesterolemia, unspecified: Secondary | ICD-10-CM | POA: Diagnosis not present

## 2019-10-11 DIAGNOSIS — R5383 Other fatigue: Secondary | ICD-10-CM | POA: Diagnosis not present

## 2019-10-20 ENCOUNTER — Other Ambulatory Visit: Payer: Self-pay | Admitting: Cardiology

## 2019-10-27 DIAGNOSIS — I10 Essential (primary) hypertension: Secondary | ICD-10-CM | POA: Diagnosis not present

## 2019-10-27 DIAGNOSIS — I2510 Atherosclerotic heart disease of native coronary artery without angina pectoris: Secondary | ICD-10-CM | POA: Diagnosis not present

## 2019-10-27 DIAGNOSIS — E7800 Pure hypercholesterolemia, unspecified: Secondary | ICD-10-CM | POA: Diagnosis not present

## 2019-11-25 DIAGNOSIS — I10 Essential (primary) hypertension: Secondary | ICD-10-CM | POA: Diagnosis not present

## 2019-11-25 DIAGNOSIS — E7800 Pure hypercholesterolemia, unspecified: Secondary | ICD-10-CM | POA: Diagnosis not present

## 2019-11-25 DIAGNOSIS — I2510 Atherosclerotic heart disease of native coronary artery without angina pectoris: Secondary | ICD-10-CM | POA: Diagnosis not present

## 2019-12-14 ENCOUNTER — Other Ambulatory Visit: Payer: Self-pay | Admitting: Cardiology

## 2019-12-20 DIAGNOSIS — I10 Essential (primary) hypertension: Secondary | ICD-10-CM | POA: Diagnosis not present

## 2019-12-20 DIAGNOSIS — K8020 Calculus of gallbladder without cholecystitis without obstruction: Secondary | ICD-10-CM | POA: Diagnosis not present

## 2019-12-20 DIAGNOSIS — I714 Abdominal aortic aneurysm, without rupture: Secondary | ICD-10-CM | POA: Diagnosis not present

## 2019-12-20 DIAGNOSIS — K529 Noninfective gastroenteritis and colitis, unspecified: Secondary | ICD-10-CM | POA: Diagnosis not present

## 2019-12-20 DIAGNOSIS — K5730 Diverticulosis of large intestine without perforation or abscess without bleeding: Secondary | ICD-10-CM | POA: Diagnosis not present

## 2019-12-20 DIAGNOSIS — K3189 Other diseases of stomach and duodenum: Secondary | ICD-10-CM | POA: Diagnosis not present

## 2019-12-20 DIAGNOSIS — R52 Pain, unspecified: Secondary | ICD-10-CM | POA: Diagnosis not present

## 2019-12-20 DIAGNOSIS — R1031 Right lower quadrant pain: Secondary | ICD-10-CM | POA: Diagnosis not present

## 2019-12-20 DIAGNOSIS — I700 Atherosclerosis of aorta: Secondary | ICD-10-CM | POA: Diagnosis not present

## 2019-12-22 DIAGNOSIS — M47817 Spondylosis without myelopathy or radiculopathy, lumbosacral region: Secondary | ICD-10-CM | POA: Diagnosis not present

## 2019-12-22 DIAGNOSIS — M47816 Spondylosis without myelopathy or radiculopathy, lumbar region: Secondary | ICD-10-CM | POA: Diagnosis not present

## 2019-12-22 DIAGNOSIS — Z743 Need for continuous supervision: Secondary | ICD-10-CM | POA: Diagnosis not present

## 2019-12-22 DIAGNOSIS — M79604 Pain in right leg: Secondary | ICD-10-CM | POA: Diagnosis not present

## 2019-12-22 DIAGNOSIS — I700 Atherosclerosis of aorta: Secondary | ICD-10-CM | POA: Diagnosis not present

## 2019-12-22 DIAGNOSIS — R6889 Other general symptoms and signs: Secondary | ICD-10-CM | POA: Diagnosis not present

## 2019-12-22 DIAGNOSIS — I714 Abdominal aortic aneurysm, without rupture: Secondary | ICD-10-CM | POA: Diagnosis not present

## 2019-12-22 DIAGNOSIS — K5790 Diverticulosis of intestine, part unspecified, without perforation or abscess without bleeding: Secondary | ICD-10-CM | POA: Diagnosis not present

## 2019-12-22 DIAGNOSIS — K5730 Diverticulosis of large intestine without perforation or abscess without bleeding: Secondary | ICD-10-CM | POA: Diagnosis not present

## 2019-12-22 DIAGNOSIS — N200 Calculus of kidney: Secondary | ICD-10-CM | POA: Diagnosis not present

## 2019-12-22 DIAGNOSIS — K529 Noninfective gastroenteritis and colitis, unspecified: Secondary | ICD-10-CM | POA: Diagnosis not present

## 2019-12-22 DIAGNOSIS — K8020 Calculus of gallbladder without cholecystitis without obstruction: Secondary | ICD-10-CM | POA: Diagnosis not present

## 2019-12-22 DIAGNOSIS — M1611 Unilateral primary osteoarthritis, right hip: Secondary | ICD-10-CM | POA: Diagnosis not present

## 2019-12-22 DIAGNOSIS — R9431 Abnormal electrocardiogram [ECG] [EKG]: Secondary | ICD-10-CM | POA: Diagnosis not present

## 2019-12-22 DIAGNOSIS — K4090 Unilateral inguinal hernia, without obstruction or gangrene, not specified as recurrent: Secondary | ICD-10-CM | POA: Diagnosis not present

## 2019-12-22 DIAGNOSIS — N189 Chronic kidney disease, unspecified: Secondary | ICD-10-CM | POA: Diagnosis not present

## 2019-12-22 DIAGNOSIS — I129 Hypertensive chronic kidney disease with stage 1 through stage 4 chronic kidney disease, or unspecified chronic kidney disease: Secondary | ICD-10-CM | POA: Diagnosis not present

## 2019-12-23 DIAGNOSIS — K4090 Unilateral inguinal hernia, without obstruction or gangrene, not specified as recurrent: Secondary | ICD-10-CM | POA: Diagnosis not present

## 2019-12-23 DIAGNOSIS — I700 Atherosclerosis of aorta: Secondary | ICD-10-CM | POA: Diagnosis not present

## 2019-12-23 DIAGNOSIS — Z299 Encounter for prophylactic measures, unspecified: Secondary | ICD-10-CM | POA: Diagnosis not present

## 2019-12-23 DIAGNOSIS — R1031 Right lower quadrant pain: Secondary | ICD-10-CM | POA: Diagnosis not present

## 2019-12-23 DIAGNOSIS — I10 Essential (primary) hypertension: Secondary | ICD-10-CM | POA: Diagnosis not present

## 2019-12-27 DIAGNOSIS — I2510 Atherosclerotic heart disease of native coronary artery without angina pectoris: Secondary | ICD-10-CM | POA: Diagnosis not present

## 2019-12-27 DIAGNOSIS — E7800 Pure hypercholesterolemia, unspecified: Secondary | ICD-10-CM | POA: Diagnosis not present

## 2019-12-27 DIAGNOSIS — I10 Essential (primary) hypertension: Secondary | ICD-10-CM | POA: Diagnosis not present

## 2019-12-28 ENCOUNTER — Telehealth: Payer: Self-pay | Admitting: *Deleted

## 2019-12-28 DIAGNOSIS — R1031 Right lower quadrant pain: Secondary | ICD-10-CM | POA: Diagnosis not present

## 2019-12-28 NOTE — Telephone Encounter (Signed)
    [facility] Pre-operative Risk Assessment    HEARTCARE STAFF: - Please ensure there is not already an duplicate clearance open for this procedure. - Under Visit Info/Reason for Call, type in Other and utilize the format Clearance MM/DD/YY or Clearance TBD. Do not use dashes or single digits. - If request is for dental extraction, please clarify the # of teeth to be extracted.  Request for surgical clearance:  1. What type of surgery is being performed? Inguinal Hernia Repair  2. When is this surgery scheduled? TBD  3. What type of clearance is required (medical clearance vs. Pharmacy clearance to hold med vs. Both)? Medical/Cardiac  4. Are there any medications that need to be held prior to surgery and how long? No  5. Practice name and name of physician performing surgery? [facility]/Dr. Stechschultes  6. What is the office phone number? [phone]   7.   What is the office fax number? [phone]  8.   Anesthesia type (None, local, MAC, general) ? general   Kevin [name] 12/28/2019, 12:11 PM  _________________________________________________________________   (provider comments below)

## 2019-12-28 NOTE — Telephone Encounter (Signed)
Left VM

## 2020-01-04 NOTE — Telephone Encounter (Signed)
Patient has appointment with Dr. Domenic Polite scheduled for 01/13/2020. It does not look like surgery has been scheduled. Pre-op covering staff, can you check with requesting surgeon's office and make sure surgery has not been scheduled and if it is OK if we address pre-op risk assessment at this visit?  Thank you!

## 2020-01-04 NOTE — Telephone Encounter (Signed)
S/w Rosendo Gros at [facility] and confirmed surgery has not yet been scheduled as they are waiting for cardiac clearance. I stated the pt has an appt 01/13/20 with Dr. Domenic Polite which pt will be assessed for clearance at that time. I will forward clearance notes to MD for upcoming appt. Will send FYI to requesting office as FYI in regards to appt 01/13/20. Will remove from the pre op call back pool.

## 2020-01-05 ENCOUNTER — Other Ambulatory Visit: Payer: Self-pay

## 2020-01-05 ENCOUNTER — Ambulatory Visit: Payer: Self-pay

## 2020-01-05 ENCOUNTER — Encounter: Payer: Self-pay | Admitting: Orthopaedic Surgery

## 2020-01-05 ENCOUNTER — Ambulatory Visit (INDEPENDENT_AMBULATORY_CARE_PROVIDER_SITE_OTHER): Payer: Medicare Other | Admitting: Orthopaedic Surgery

## 2020-01-05 VITALS — Ht 66.0 in | Wt 204.0 lb

## 2020-01-05 DIAGNOSIS — M25551 Pain in right hip: Secondary | ICD-10-CM

## 2020-01-05 DIAGNOSIS — G8929 Other chronic pain: Secondary | ICD-10-CM | POA: Diagnosis not present

## 2020-01-05 DIAGNOSIS — M545 Low back pain, unspecified: Secondary | ICD-10-CM | POA: Diagnosis not present

## 2020-01-05 MED ORDER — DIAZEPAM 5 MG PO TABS: ORAL_TABLET | ORAL | 0 refills | Status: DC

## 2020-01-09 DIAGNOSIS — I714 Abdominal aortic aneurysm, without rupture: Secondary | ICD-10-CM | POA: Diagnosis not present

## 2020-01-09 DIAGNOSIS — R600 Localized edema: Secondary | ICD-10-CM | POA: Diagnosis not present

## 2020-01-09 DIAGNOSIS — Z299 Encounter for prophylactic measures, unspecified: Secondary | ICD-10-CM | POA: Diagnosis not present

## 2020-01-09 DIAGNOSIS — I10 Essential (primary) hypertension: Secondary | ICD-10-CM | POA: Diagnosis not present

## 2020-01-09 DIAGNOSIS — M545 Low back pain, unspecified: Secondary | ICD-10-CM | POA: Diagnosis not present

## 2020-01-10 DIAGNOSIS — M79672 Pain in left foot: Secondary | ICD-10-CM | POA: Diagnosis not present

## 2020-01-10 DIAGNOSIS — M79675 Pain in left toe(s): Secondary | ICD-10-CM | POA: Diagnosis not present

## 2020-01-10 DIAGNOSIS — M79671 Pain in right foot: Secondary | ICD-10-CM | POA: Diagnosis not present

## 2020-01-10 DIAGNOSIS — M79674 Pain in right toe(s): Secondary | ICD-10-CM | POA: Diagnosis not present

## 2020-01-10 DIAGNOSIS — I739 Peripheral vascular disease, unspecified: Secondary | ICD-10-CM | POA: Diagnosis not present

## 2020-01-10 NOTE — Progress Notes (Signed)
Office Visit Note   Patient: Kevin [name]           Date of Birth: 05-Oct-1930           MRN: [id number] Visit Date: 01/05/2020              Requested by: Glenda Chroman, MD [place],  [place] [address] PCP: Glenda Chroman, MD   Assessment & Plan: Visit Diagnoses:  1. Pain in right hip   2. Chronic right-sided low back pain, unspecified whether sciatica present     Plan: We will proceed with MRI scan lumbar with his severe back pain and right leg pain and history of that he is not able to walk with intermittent symptoms.  Office follow-up after lumbar MRI scan for review.  He is already been on ibuprofen, tramadol use with a cane, home exercise program without relief.  Follow-up after MRI scan.  Follow-Up Instructions: No follow-ups on file.   Orders:  Orders Placed This Encounter  Procedures  . XR HIP UNILAT W OR W/O PELVIS 2-3 VIEWS RIGHT  . XR Lumbar Spine 2-3 Views  . MR Lumbar Spine w/o contrast   Meds ordered this encounter  Medications  . diazepam (VALIUM) 5 MG tablet    Sig: Take one tablet one hour prior to procedure. Repeat as needed. Must have driver.    Dispense:  3 tablet    Refill:  0      Procedures: No procedures performed   Clinical Data: No additional findings.   Subjective: Chief Complaint  Patient presents with  . Right Hip - Pain  . Lower Back - Pain    HPI 86 year old male seen with low back pain that radiated into his right hip some in the groin and into his right leg.  He states his pain was severe and he rated 10 out of 10.  Pain went away for short period time and then recurred at Thanksgiving and again was severe and he was not able to walk.  He previously had tramadol which did not seem to help he also took ibuprofen.  He has had problems with arterial claudication worse in the right leg than left with bilateral femoral bruits.  Stage IV kidney disease, hypertension hyperlipidemia non-STEMI anemia.  He does have cardiac stents.   Patient has an upcoming inguinal hernia surgery.  Patient referred by Dr.Vyas for failed conservative treatment .  Review of Systems all other systems are noncontributory other than that mentioned HPI.   Objective: Vital Signs: Ht 5\' 6"  (1.676 m)   Wt 204 lb (92.5 kg)   BMI 32.93 kg/m   Physical Exam Constitutional:      Appearance: He is well-developed and well-nourished.  HENT:     Head: Normocephalic and atraumatic.  Eyes:     Extraocular Movements: EOM normal.     Pupils: Pupils are equal, round, and reactive to light.  Neck:     Thyroid: No thyromegaly.     Trachea: No tracheal deviation.  Cardiovascular:     Rate and Rhythm: Normal rate.  Pulmonary:     Effort: Pulmonary effort is normal.     Breath sounds: No wheezing.  Abdominal:     General: Bowel sounds are normal.     Palpations: Abdomen is soft.  Skin:    General: Skin is warm and dry.     Capillary Refill: Capillary refill takes less than 2 seconds.  Neurological:     Mental  Status: He is alert and oriented to person, place, and time.  Psychiatric:        Mood and Affect: Mood and affect normal.        Behavior: Behavior normal.        Thought Content: Thought content normal.        Judgment: Judgment normal.     Ortho Exam patient has negative logroll the hips.  He has some sciatic notch tenderness both right and left.  Knee and ankle jerk are intact.  Knees reach full extension.  Anterior tib gastrocsoleus is intact.  Decreased pedal pulses right and left.  Specialty Comments:  No specialty comments available.  Imaging: Test test. AP and lateral lumbar x-rays are obtained and reviewed. This  shows previous gallbladder clips. Lumbar facet arthropathy. Disc space  narrowing at L2-3 through L4-5 with prominent endplate spurring. No  spondylolisthesis.   Impression: Lumbar disc degeneration L2-L5 negative for acute changes.    PMFS History: Patient Active Problem List   Diagnosis Date Noted   . NSTEMI (non-ST elevated myocardial infarction) ([place]) 12/11/2015  . Acute on chronic combined systolic and diastolic heart failure, NYHA class 2 ([place]) 12/11/2015  . Community acquired pneumonia   . Physical deconditioning   . Abdominal pain   . AKI (acute kidney injury) ([place])   . Sepsis ([place]) 12/05/2015  . Acute cholecystitis 12/05/2015  . Troponin level elevated 12/05/2015  . Acute on chronic renal insufficiency 12/05/2015  . Sepsis, unspecified organism ([place]) 12/04/2015  . Elevated LFTs 12/04/2015  . Chest pain   . CAD- S/P PCI 2014   . Femoral bruit 02/25/2012  . CKD (chronic kidney disease) stage 4, GFR 15-29 ml/min ([facility]) 02/07/2012  . Tobacco abuse 02/07/2012  . Essential hypertension, benign 02/07/2012  . Hyperlipidemia 02/07/2012  . Peripheral arterial disease ([place]) 02/07/2012  . Anemia of chronic disease 02/07/2012   Past Medical History:  Diagnosis Date  . Arthritis   . CAD (coronary artery disease)    a. s/p DES to LAD (2014)  b. s/p LHC (09/2014) with patent LAD stent and stable dz from previous cath   . Chronic anemia   . CKD (chronic kidney disease), stage IV ([place])   . Claudication ([place])    R leg with bilateral femoral bruits.  . Essential hypertension   . Hyperlipidemia   . NSTEMI (non-ST elevated myocardial infarction) ([place])     Family History  Problem Relation Age of Onset  . Hypertension Mother   . Alzheimer's disease Mother   . Other Father        brain tumor  . Heart disease Brother   . Heart disease Sister     Past Surgical History:  Procedure Laterality Date  . Bilateral carpal tunnel release Bilateral   . CARDIAC CATHETERIZATION N/A 10/10/2014   Procedure: Left Heart Cath and Coronary Angiography;  Surgeon: Belva Crome, MD;  Location: [place] [facility];  Service: Cardiovascular;  Laterality: N/A;  . LEFT HEART CATHETERIZATION WITH CORONARY ANGIOGRAM N/A 02/09/2012   Procedure: LEFT HEART CATHETERIZATION WITH CORONARY ANGIOGRAM;  Surgeon:  Peter M Martinique, MD; left main 10%, LAD 80%/95%, D1 patent, CFX 70% at OM 2, dCFX 90%, RCA 100% with L-R collaterals    . PERCUTANEOUS CORONARY STENT INTERVENTION (PCI-S) N/A 02/11/2012   Procedure: PERCUTANEOUS CORONARY STENT INTERVENTION (PCI-S);  Surgeon: Burnell Blanks, MD; 3.0 x 28 mm Promus Premier DES to the mid LAD   . REPAIR OF PERFORATED ULCER  Social History   Occupational History  . 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 Smoking status: Former Smoker    Packs/day: 0.50    Years: 45.00    Pack years: 22.50    Types: Cigarettes    Start date: 01/28/1968    Quit date: 02/06/2012    Years since quitting: 7.9  . Smokeless tobacco: Never Used  Substance and Sexual Activity  . Alcohol use: No    Alcohol/week: 0.0 standard drinks  . Drug use: No  . Sexual activity: Not on file

## 2020-01-13 ENCOUNTER — Ambulatory Visit (HOSPITAL_COMMUNITY)
Admission: RE | Admit: 2020-01-13 | Discharge: 2020-01-13 | Disposition: A | Discharge status: Home / Self Care | Payer: Medicare Other | Source: Ambulatory Visit | Attending: Orthopaedic Surgery | Admitting: Orthopaedic Surgery

## 2020-01-13 ENCOUNTER — Ambulatory Visit: Payer: Medicare Other | Admitting: Cardiology

## 2020-01-13 ENCOUNTER — Other Ambulatory Visit: Payer: Self-pay

## 2020-01-13 DIAGNOSIS — M545 Low back pain, unspecified: Secondary | ICD-10-CM | POA: Insufficient documentation

## 2020-01-13 DIAGNOSIS — G8929 Other chronic pain: Secondary | ICD-10-CM

## 2020-01-22 NOTE — Progress Notes (Signed)
Cardiology Office Note  Date: 01/23/2020   ID: Tracey Stewart Fedder, DOB 1930/05/16, MRN [id number]  PCP:  Glenda Chroman, MD  Cardiologist:  Rozann Lesches, MD Electrophysiologist:  None   Chief Complaint: Pre op clearance hernia surgery  History of Present Illness: Kevin [name] is a 86 y.o. male with a history of CAD (DES to LAD with known CTO distal RCA with L>R collateral and severe LCX), CKD, Chronic anemia, HTN, Claudication, HLD, NSTEMI.  Last encounter with Dr Domenic Polite on 07/08/2019. Had no anginal symptoms or NTG use. He was functional with his ADLs. He was symptomatically stable with CAD on current medical therapy. He was continuing ASA, Norvasc, Lipitor, Imdur, Lopressor and prn NTG. He was compliant with Lipitor. His last LDL was 139. Follow up plan for recheck of LDL later in the year. BP was well controlled. There were no changes to therapy. Last Crt 1.96 CKD stage IIIb.  Patient is here today for preop clearance for bilateral inguinal hernia surgery.  Referred by Dr. Blake Divine.  He has a history of coronary artery disease with DES to LAD and CTO of the distal RCA with left to right collaterals and severe LCx disease.  He denies any recent anginal or exertional symptoms.  He does have some activity intolerance and some mild dyspnea when performing more than usual ADLs.  He has some significant degenerative disease in his back and sees Dr. Lorin [facility].  He denies any anginal symptoms.  No CVA or TIA-like symptoms, orthostatic symptoms, PND, orthopnea, weight gain, bleeding.  Has some occasional mild intermittent edema.  Past Medical History:  Diagnosis Date  . Arthritis   . CAD (coronary artery disease)    a. s/p DES to LAD (2014)  b. s/p LHC (09/2014) with patent LAD stent and stable dz from previous cath   . Chronic anemia   . CKD (chronic kidney disease), stage IV ([place])   . Claudication ([place])    R leg with bilateral femoral bruits.  . Essential hypertension   .  Hyperlipidemia   . NSTEMI (non-ST elevated myocardial infarction) [facility])     Past Surgical History:  Procedure Laterality Date  . Bilateral carpal tunnel release Bilateral   . CARDIAC CATHETERIZATION N/A 10/10/2014   Procedure: Left Heart Cath and Coronary Angiography;  Surgeon: Belva Crome, MD;  Location: [place] [facility];  Service: Cardiovascular;  Laterality: N/A;  . LEFT HEART CATHETERIZATION WITH CORONARY ANGIOGRAM N/A 02/09/2012   Procedure: LEFT HEART CATHETERIZATION WITH CORONARY ANGIOGRAM;  Surgeon: Peter M Martinique, MD; left main 10%, LAD 80%/95%, D1 patent, CFX 70% at OM 2, dCFX 90%, RCA 100% with L-R collaterals    . PERCUTANEOUS CORONARY STENT INTERVENTION (PCI-S) N/A 02/11/2012   Procedure: PERCUTANEOUS CORONARY STENT INTERVENTION (PCI-S);  Surgeon: Burnell Blanks, MD; 3.0 x 28 mm Promus Premier DES to the mid LAD   . REPAIR OF PERFORATED ULCER      Current Outpatient Medications  Medication Sig Dispense Refill  . amLODipine (NORVASC) 10 MG tablet Take 10 mg by mouth daily.    Marland Kitchen aspirin EC 81 MG tablet Take 81 mg by mouth daily.    Marland Kitchen atorvastatin (LIPITOR) 80 MG tablet Take 1 tablet (80 mg total) by mouth every evening. 90 tablet 3  . Cholecalciferol (VITAMIN D-3) 1000 UNITS CAPS Take 1,000 Units by mouth daily.     . diazepam (VALIUM) 5 MG tablet Take one tablet one hour prior to procedure. Repeat as needed.  Must have driver. 3 tablet 0  . guaiFENesin (MUCINEX) 600 MG 12 hr tablet Take 600 mg by mouth daily.    . hydrALAZINE (APRESOLINE) 100 MG tablet TAKE ONE TABLET BY MOUTH THREE TIMES DAILY 90 tablet 6  . HYDROcodone-acetaminophen (NORCO/VICODIN) 5-325 MG tablet Take 1 tablet by mouth 4 (four) times daily as needed. (Patient not taking: Reported on 01/05/2020)    . ibuprofen (ADVIL) 800 MG tablet Take 800 mg by mouth every 8 (eight) hours as needed.    . isosorbide mononitrate (IMDUR) 60 MG 24 hr tablet TAKE ONE TABLET BY MOUTH DAILY. 90 tablet 1  . loratadine  (CLARITIN) 10 MG tablet Take 10 mg by mouth daily.  2  . metoprolol tartrate (LOPRESSOR) 25 MG tablet TAKE 1/2 TABLET BY MOUTH TWICE DAILY. 90 tablet 1  . nitroGLYCERIN (NITROSTAT) 0.4 MG SL tablet Place 1 tablet (0.4 mg total) under the tongue every 5 (five) minutes x 3 doses as needed for chest pain (if no relief after 3rd dose, proceed to the ED for an evaluation or call 911). 25 tablet 3  . Polysaccharide Iron Complex (POLY-IRON 150 PO) Take 150 mg by mouth daily.    . sodium bicarbonate 650 MG tablet Take 1 tablet (650 mg total) by mouth 2 (two) times daily. 60 tablet 0  . traMADol (ULTRAM) 50 MG tablet SMARTSIG:1 Tablet(s) By Mouth Every 12 Hours PRN (Patient not taking: Reported on 01/05/2020)     No current facility-administered medications for this visit.   Allergies:  Patient has no known allergies.   Social History: The patient  reports that he quit smoking about 7 years ago. His smoking use included cigarettes. He started smoking about 52 years ago. He has a 22.50 pack-year smoking history. He has never used smokeless tobacco. He reports that he does not drink alcohol and does not use drugs.   Family History: The patient's family history includes Alzheimer's disease in his mother; Heart disease in his brother and sister; Hypertension in his mother; Other in his father.   ROS:  Please see the history of present illness. Otherwise, complete review of systems is positive for none.  All other systems are reviewed and negative.   Physical Exam: VS:  Wt 204 lb (92.5 kg)   BMI 32.93 kg/m , BMI Body mass index is 32.93 kg/m.  Wt Readings from Last 3 Encounters:  01/23/20 204 lb (92.5 kg)  01/05/20 204 lb (92.5 kg)  07/08/19 206 lb 6.4 oz (93.6 kg)    General: Patient appears comfortable at rest. Neck: Supple, no elevated JVP or carotid bruits, no thyromegaly. Lungs: Clear to auscultation, nonlabored breathing at rest. Cardiac: Regular rate and rhythm, no S3 or significant systolic  murmur, no pericardial rub. Extremities: No pitting edema, distal pulses 2+. Skin: Warm and dry. Musculoskeletal: No kyphosis. Neuropsychiatric: Alert and oriented x3, affect grossly appropriate.  ECG:  EKG 07/08/2019 sinus rhythm with intermittent ectopic atrial beats rate of 62.  Recent Labwork: No results found for requested labs within last 8760 hours.     Component Value Date/Time   CHOL 123 12/11/2015 0152   TRIG 94 12/11/2015 0152   HDL 29 (L) 12/11/2015 0152   CHOLHDL 4.2 12/11/2015 0152   VLDL 19 12/11/2015 0152   LDLCALC 75 12/11/2015 0152    Other Studies Reviewed Today:   Echocardiogram 12/11/2015 Study Conclusions  - Left ventricle: The cavity size was normal. Wall thickness was  increased in a pattern of mild LVH. Systolic function  was normal.  The estimated ejection fraction was in the range of 55% to 60%.  Wall motion was normal; there were no regional wall motion  abnormalities. Doppler parameters are consistent with abnormal  left ventricular relaxation (grade 1 diastolic dysfunction).  - Aortic valve: There was trivial regurgitation.  - Mitral valve: There was mild regurgitation.  - Left atrium: The atrium was moderately dilated.  - Pulmonary arteries: Systolic pressure was mildly increased. PA  peak pressure: 32 mm Hg (S).   Impressions: - Normal LV systolic function; grade 1 diastolic dysfunction; trace  AI; mild MR; moderate LAE; trace TR with mildly elevated  pulmonary pressure.    Cardiac catheterization 10/10/2014 Conclusion  1. Mid RCA lesion, 100% stenosed. 2. Mid LAD to Dist LAD lesion, 10% stenosed. The lesion was previously treated with a stent (unknown type) . 3. 3rd Mrg lesion, 90% stenosed. 4. Dist RCA lesion, 100% stenosed. 5. 2nd Diag lesion, 75% stenosed. 6. Dist Cx-1 lesion, 75% stenosed. 7. Dist Cx-2 lesion, 65% stenosed.    Widely patent left anterior descending stent.  Total occlusion of the mid to distal  RCA. Distal RCA is supplied by collaterals from the circumflex and LAD.  Highly diseased distal circumflex with a stable eccentric 75% stenosis proximal to the second obtuse marginal. There is diffuse disease beyond the second obtuse marginal and 90% stenosis and a small third obtuse marginal branch. This distal circumflex territory as the source of collaterals to the occluded right coronary. The anatomy is unchanged from 2014.  LV function is not performed. LV end-diastolic pressure is 10 mmHg.  During 5/10 chest discomfort that developed at completion of the case, repeat angiography revealed identical anatomy to that noted above.   RECOMMENDATIONS:   Re-cycle cardiac markers  Consider alternative explanations for the patient's chest discomfort, although the presence of ventricular ectopy during the pain is concerning. The absence of response to sublingual nitroglycerin is also unusual if we consider the discomfort could be ischemia related.  Per treating team Diagnostic Dominance: Co-dominant        Assessment and Plan:  1. Encounter for pre-operative cardiovascular clearance   2. CAD in native artery   3. Essential hypertension, benign   4. Mixed hyperlipidemia     1. Encounter for pre-operative cardiovascular clearance Patient has pending bilateral inguinal surgery by Dr. Blake Divine.  He is here for preop clearance.  He currently denies any significant anginal or exertional symptoms.  He does have some significant CAD noted on last catheterization above.  We will order a follow-up Lexiscan and echocardiogram for risk stratification.  We will make a decision after tests are done as to risks for surgery.  2. CAD in native artery Previous DES to mid LAD to distal LAD.  Last cardiac catheterization 10/10/2014: Mid RCA 100% CTO.  Mid LAD to distal LAD, 10% stenosis.  Lesion with a previously placed stent.  Third marginal lesion 90%, distal RCA 100%, second diagonal 75%,  distal CX 1 lesion 75%.  Distal CX 2 lesions 65%.  Continue aspirin 81 mg daily.  Continue Imdur 60 mg daily.  Continue metoprolol 12.5 mg p.o. twice daily.  Continue nitroglycerin sublingual as needed  3. Essential hypertension, benign Pressure reasonably well controlled today.  Blood pressure today 136/74.  Continue amlodipine 10 mg daily.  Continue hydralazine 100 mg 3 times daily.  4. Mixed hyperlipidemia Continue atorvastatin 80 mg daily.  Medication Adjustments/Labs and Tests Ordered: Current medicines are reviewed at length with the patient today.  Concerns regarding medicines are outlined above.   Disposition: Follow-up with Dr. Domenic Polite or APP 4 to 6 weeks after echo and Lexiscan  Signed, Levell July, NP 01/23/2020 1:35 PM    [facility] at [place], [place], [place] [address] Phone: [phone]; Fax: [phone]

## 2020-01-23 ENCOUNTER — Ambulatory Visit (INDEPENDENT_AMBULATORY_CARE_PROVIDER_SITE_OTHER): Payer: Medicare Other | Admitting: Family Medicine

## 2020-01-23 ENCOUNTER — Telehealth: Payer: Self-pay | Admitting: Family Medicine

## 2020-01-23 ENCOUNTER — Other Ambulatory Visit: Payer: Self-pay

## 2020-01-23 ENCOUNTER — Encounter: Payer: Self-pay | Admitting: Family Medicine

## 2020-01-23 VITALS — BP 136/74 | HR 52 | Ht 66.0 in | Wt 204.0 lb

## 2020-01-23 DIAGNOSIS — I10 Essential (primary) hypertension: Secondary | ICD-10-CM

## 2020-01-23 DIAGNOSIS — E782 Mixed hyperlipidemia: Secondary | ICD-10-CM

## 2020-01-23 DIAGNOSIS — Z01810 Encounter for preprocedural cardiovascular examination: Secondary | ICD-10-CM

## 2020-01-23 DIAGNOSIS — I2510 Atherosclerotic heart disease of native coronary artery without angina pectoris: Secondary | ICD-10-CM

## 2020-01-23 NOTE — Telephone Encounter (Signed)
Pre-cert Verification for the following procedure    LEXISCAN   DATE:   01/30/2020  LOCATION: [facility]

## 2020-01-23 NOTE — Patient Instructions (Signed)
Medication Instructions:  Your physician recommends that you continue on your current medications as directed. Please refer to the Current Medication list given to you today.  *If you need a refill on your cardiac medications before your next appointment, please call your pharmacy*   Lab Work: None If you have labs (blood work) drawn today and your tests are completely normal, you will receive your results only by: Marland Kitchen MyChart Message (if you have MyChart) OR . A paper copy in the mail If you have any lab test that is abnormal or we need to change your treatment, we will call you to review the results.   Testing/Procedures: Your physician has requested that you have a lexiscan myoview. For further information please visit [email or web address]. Please follow instruction sheet, as given.  Your physician has requested that you have an echocardiogram. Echocardiography is a painless test that uses sound waves to create images of your heart. It provides your doctor with information about the size and shape of your heart and how well your heart's chambers and valves are working. This procedure takes approximately one hour. There are no restrictions for this procedure.  Follow-Up: At [facility], you and your health needs are our priority.  As part of our continuing mission to provide you with exceptional heart care, we have created designated Provider Care Teams.  These Care Teams include your primary Cardiologist (physician) and Advanced Practice Providers (APPs -  Physician Assistants and Nurse Practitioners) who all work together to provide you with the care you need, when you need it.  We recommend signing up for the patient portal called "MyChart".  Sign up information is provided on this After Visit Summary.  MyChart is used to connect with patients for Virtual Visits (Telemedicine).  Patients are able to view lab/test results, encounter notes, upcoming appointments, etc.  Non-urgent messages  can be sent to your provider as well.   To learn more about what you can do with MyChart, go to [email or web address].    Your next appointment:   Follow Up after Test

## 2020-01-26 ENCOUNTER — Encounter: Payer: Self-pay | Admitting: Orthopaedic Surgery

## 2020-01-26 ENCOUNTER — Ambulatory Visit (INDEPENDENT_AMBULATORY_CARE_PROVIDER_SITE_OTHER): Payer: Medicare Other | Admitting: Orthopaedic Surgery

## 2020-01-26 ENCOUNTER — Other Ambulatory Visit: Payer: Self-pay

## 2020-01-26 DIAGNOSIS — R1031 Right lower quadrant pain: Secondary | ICD-10-CM | POA: Diagnosis not present

## 2020-01-26 NOTE — Progress Notes (Signed)
Office Visit Note   Patient: Kevin [name]           Date of Birth: 04-16-1930           MRN: [id number] Visit Date: 01/26/2020              Requested by: Glenda Chroman, MD [place],  [place] [address] PCP: Glenda Chroman, MD   Assessment & Plan: Visit Diagnoses:  1. Groin pain, right              Resolved as on 01/08/20  Plan: Patient is low back pain and anterior groin pain was likely related to some subarticular lateral recess stenosis at L2-3 on the right side.  This likely is gone down some since his symptoms have resolved and we will release her from care and check him back on an as-needed basis.  We reviewed the MRI gave him a copy of the report.  He does have some narrowing at L4-5 as well but this level is asymptomatic also.  Follow-Up Instructions: No follow-ups on file.   Orders:  No orders of the defined types were placed in this encounter.  No orders of the defined types were placed in this encounter.     Procedures: No procedures performed   Clinical Data: No additional findings.   Subjective: Chief Complaint  Patient presents with  . Lower Back - Follow-up    MRI review    HPI 86 year old male returns and states that his MRI scan has been performed but he states as of 01/08/2020 he has had no pain.  He has been amatory with a cane to prevent falling.  He states the pain that radiated from his back into his buttocks into his anterior right groin has resolved.  Previously it was 10 out of 10 now he states it is 0 out of 10.  He has cardiac stress test coming up.  Review of Systems 14 point review systems updated unchanged from last visit.  No bowel or bladder symptoms.  Does have a history of cardiac stents and non-STEMI.     Objective: Vital Signs: Ht 5\' 6"  (1.676 m)   Wt 204 lb (92.5 kg)   BMI 32.93 kg/m   Physical Exam Constitutional:      Appearance: He is well-developed and well-nourished.  HENT:     Head: Normocephalic and atraumatic.   Eyes:     Extraocular Movements: EOM normal.     Pupils: Pupils are equal, round, and reactive to light.  Neck:     Thyroid: No thyromegaly.     Trachea: No tracheal deviation.  Cardiovascular:     Rate and Rhythm: Normal rate.  Pulmonary:     Effort: Pulmonary effort is normal.     Breath sounds: No wheezing.  Abdominal:     General: Bowel sounds are normal.     Palpations: Abdomen is soft.  Skin:    General: Skin is warm and dry.     Capillary Refill: Capillary refill takes less than 2 seconds.  Neurological:     Mental Status: He is alert and oriented to person, place, and time.  Psychiatric:        Mood and Affect: Mood and affect normal.        Behavior: Behavior normal.        Thought Content: Thought content normal.        Judgment: Judgment normal.     Ortho Exam negative straight leg  raising 90 degrees negative logroll to the hips.  He is mobile on and off the exam table without any pain.  specialty Comments:  No specialty comments available.  Imaging: CLINICAL DATA:  Low back pain radiating down right leg  EXAM: MRI LUMBAR SPINE WITHOUT CONTRAST  TECHNIQUE: Multiplanar, multisequence MR imaging of the lumbar spine was performed. No intravenous contrast was administered.  COMPARISON:  None.  FINDINGS: Segmentation:  Standard.  Alignment:  Anteroposterior alignment is maintained.  Vertebrae: Multilevel degenerative plate irregularity with Schmorl's nodes. Vertebral body heights are otherwise maintained. There are chronic appearing degenerative endplate marrow changes. No substantial marrow edema. No suspicious osseous lesion.  Conus medullaris and cauda equina: Conus extends to the L2 level. Conus and cauda equina appear normal.  Paraspinal and other soft tissues: Unremarkable.  Disc levels:  L1-L2: Minimal disc bulge. Facet arthropathy with ligamentum flavum infolding. No canal or foraminal stenosis.  L2-L3: Disc bulge with  endplate osteophytic ridging. Facet arthropathy with ligamentum flavum infolding. Mild to moderate canal stenosis. Partial effacement of the right greater than left subarticular recesses. Mild to moderate foraminal stenosis.  L3-L4: Disc bulge. Facet arthropathy with ligamentum flavum infolding. Mild canal stenosis. Mild to moderate foraminal stenosis.  L4-L5: Disc bulge with superimposed right foraminal protrusion. Facet arthropathy with ligamentum flavum infolding. Mild to moderate canal stenosis. Effacement of the right greater than left subarticular recesses. Marked right and moderate left foraminal stenosis.  L5-S1: Disc bulge. Facet arthropathy with ligamentum flavum infolding. No canal stenosis. Partial effacement of the left greater than right subarticular recesses. Marked foraminal stenosis.  IMPRESSION: Multilevel degenerative changes as detailed above. Canal and subarticular recess stenosis are greatest at L2-L3 and L4-L5. Right foraminal stenosis is greatest at L4-L5 and L5-S1.   Electronically Signed   By: Macy Mis M.D.   On: 01/13/2020 15:23    PMFS History: Patient Active Problem List   Diagnosis Date Noted  . Groin pain, right 01/26/2020  . NSTEMI (non-ST elevated myocardial infarction) ([place]) 12/11/2015  . Acute on chronic combined systolic and diastolic heart failure, NYHA class 2 ([place]) 12/11/2015  . Community acquired pneumonia   . Physical deconditioning   . Abdominal pain   . AKI (acute kidney injury) ([place])   . Sepsis ([place]) 12/05/2015  . Acute cholecystitis 12/05/2015  . Troponin level elevated 12/05/2015  . Acute on chronic renal insufficiency 12/05/2015  . Sepsis, unspecified organism ([place]) 12/04/2015  . Elevated LFTs 12/04/2015  . Chest pain   . CAD- S/P PCI 2014   . Femoral bruit 02/25/2012  . CKD (chronic kidney disease) stage 4, GFR 15-29 ml/min ([facility]) 02/07/2012  . Tobacco abuse 02/07/2012  . Essential hypertension, benign  02/07/2012  . Hyperlipidemia 02/07/2012  . Peripheral arterial disease ([place]) 02/07/2012  . Anemia of chronic disease 02/07/2012   Past Medical History:  Diagnosis Date  . Arthritis   . CAD (coronary artery disease)    a. s/p DES to LAD (2014)  b. s/p LHC (09/2014) with patent LAD stent and stable dz from previous cath   . Chronic anemia   . CKD (chronic kidney disease), stage IV ([place])   . Claudication ([place])    R leg with bilateral femoral bruits.  . Essential hypertension   . Hyperlipidemia   . NSTEMI (non-ST elevated myocardial infarction) ([place])     Family History  Problem Relation Age of Onset  . Hypertension Mother   . Alzheimer's disease Mother   . Other Father  brain tumor  . Heart disease Brother   . Heart disease Sister     Past Surgical History:  Procedure Laterality Date  . Bilateral carpal tunnel release Bilateral   . CARDIAC CATHETERIZATION N/A 10/10/2014   Procedure: Left Heart Cath and Coronary Angiography;  Surgeon: Belva Crome, MD;  Location: [place] [facility];  Service: Cardiovascular;  Laterality: N/A;  . LEFT HEART CATHETERIZATION WITH CORONARY ANGIOGRAM N/A 02/09/2012   Procedure: LEFT HEART CATHETERIZATION WITH CORONARY ANGIOGRAM;  Surgeon: Peter M Martinique, MD; left main 10%, LAD 80%/95%, D1 patent, CFX 70% at OM 2, dCFX 90%, RCA 100% with L-R collaterals    . PERCUTANEOUS CORONARY STENT INTERVENTION (PCI-S) N/A 02/11/2012   Procedure: PERCUTANEOUS CORONARY STENT INTERVENTION (PCI-S);  Surgeon: Burnell Blanks, MD; 3.0 x 28 mm Promus Premier DES to the mid LAD   . REPAIR OF PERFORATED ULCER     Social History   Occupational History  . 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 Smoking status: Former Smoker    Packs/day: 0.50    Years: 45.00    Pack years: 22.50    Types: Cigarettes    Start date: 01/28/1968    Quit date: 02/06/2012    Years since quitting: 7.9  . Smokeless tobacco: Never Used  Substance and  Sexual Activity  . Alcohol use: No    Alcohol/week: 0.0 standard drinks  . Drug use: No  . Sexual activity: Not on file

## 2020-01-30 ENCOUNTER — Encounter (HOSPITAL_COMMUNITY)
Admission: RE | Admit: 2020-01-30 | Discharge: 2020-01-30 | Disposition: A | Discharge status: Home / Self Care | Payer: Medicare Other | Source: Ambulatory Visit | Attending: Family Medicine | Admitting: Family Medicine

## 2020-01-30 ENCOUNTER — Encounter (HOSPITAL_COMMUNITY): Payer: Self-pay

## 2020-01-30 ENCOUNTER — Ambulatory Visit (HOSPITAL_COMMUNITY)
Admission: RE | Admit: 2020-01-30 | Discharge: 2020-01-30 | Disposition: A | Discharge status: Home / Self Care | Payer: Medicare Other | Source: Ambulatory Visit | Attending: Family Medicine | Admitting: Family Medicine

## 2020-01-30 ENCOUNTER — Other Ambulatory Visit: Payer: Self-pay

## 2020-01-30 DIAGNOSIS — Z01810 Encounter for preprocedural cardiovascular examination: Secondary | ICD-10-CM | POA: Diagnosis not present

## 2020-01-30 LAB — NM MYOCAR MULTI W/SPECT W/WALL MOTION / EF
LV dias vol: 99 mL (ref 62–150)
LV sys vol: 47 mL
Peak HR: 82 {beats}/min
RATE: 0.37
Rest HR: 54 {beats}/min
SDS: 2
SRS: 1
SSS: 3
TID: 0.94

## 2020-01-30 MED ORDER — REGADENOSON 0.4 MG/5ML IV SOLN
INTRAVENOUS | Status: AC
  Administered 2020-01-30: 0.4 mg via INTRAVENOUS
  Filled 2020-01-30: qty 5

## 2020-01-30 MED ORDER — TECHNETIUM TC 99M TETROFOSMIN IV KIT
10.0000 | PACK | Freq: Once | INTRAVENOUS | Status: AC | PRN
  Administered 2020-01-30: 10.73 via INTRAVENOUS

## 2020-01-30 MED ORDER — TECHNETIUM TC 99M TETROFOSMIN IV KIT
30.0000 | PACK | Freq: Once | INTRAVENOUS | Status: AC | PRN
  Administered 2020-01-30: 31.5 via INTRAVENOUS

## 2020-01-30 MED ORDER — SODIUM CHLORIDE FLUSH 0.9 % IV SOLN
INTRAVENOUS | Status: AC
  Administered 2020-01-30: 10 mL via INTRAVENOUS
  Filled 2020-01-30: qty 10

## 2020-01-31 DIAGNOSIS — E559 Vitamin D deficiency, unspecified: Secondary | ICD-10-CM | POA: Diagnosis not present

## 2020-01-31 DIAGNOSIS — E872 Acidosis: Secondary | ICD-10-CM | POA: Diagnosis not present

## 2020-01-31 DIAGNOSIS — E211 Secondary hyperparathyroidism, not elsewhere classified: Secondary | ICD-10-CM | POA: Diagnosis not present

## 2020-01-31 DIAGNOSIS — N1832 Chronic kidney disease, stage 3b: Secondary | ICD-10-CM | POA: Diagnosis not present

## 2020-01-31 DIAGNOSIS — D631 Anemia in chronic kidney disease: Secondary | ICD-10-CM | POA: Diagnosis not present

## 2020-01-31 DIAGNOSIS — I129 Hypertensive chronic kidney disease with stage 1 through stage 4 chronic kidney disease, or unspecified chronic kidney disease: Secondary | ICD-10-CM | POA: Diagnosis not present

## 2020-01-31 DIAGNOSIS — N189 Chronic kidney disease, unspecified: Secondary | ICD-10-CM | POA: Diagnosis not present

## 2020-01-31 DIAGNOSIS — R809 Proteinuria, unspecified: Secondary | ICD-10-CM | POA: Diagnosis not present

## 2020-02-01 ENCOUNTER — Other Ambulatory Visit: Payer: Self-pay | Admitting: Family Medicine

## 2020-02-01 DIAGNOSIS — Z01810 Encounter for preprocedural cardiovascular examination: Secondary | ICD-10-CM

## 2020-02-01 DIAGNOSIS — I2510 Atherosclerotic heart disease of native coronary artery without angina pectoris: Secondary | ICD-10-CM

## 2020-02-10 DIAGNOSIS — I10 Essential (primary) hypertension: Secondary | ICD-10-CM | POA: Diagnosis not present

## 2020-02-10 DIAGNOSIS — Z299 Encounter for prophylactic measures, unspecified: Secondary | ICD-10-CM | POA: Diagnosis not present

## 2020-02-10 DIAGNOSIS — I700 Atherosclerosis of aorta: Secondary | ICD-10-CM | POA: Diagnosis not present

## 2020-02-10 DIAGNOSIS — N1832 Chronic kidney disease, stage 3b: Secondary | ICD-10-CM | POA: Diagnosis not present

## 2020-02-10 DIAGNOSIS — I714 Abdominal aortic aneurysm, without rupture: Secondary | ICD-10-CM | POA: Diagnosis not present

## 2020-02-14 ENCOUNTER — Other Ambulatory Visit: Payer: Medicare Other

## 2020-02-16 ENCOUNTER — Ambulatory Visit: Payer: Medicare Other | Admitting: Family Medicine

## 2020-02-21 ENCOUNTER — Ambulatory Visit (INDEPENDENT_AMBULATORY_CARE_PROVIDER_SITE_OTHER): Payer: Medicare Other

## 2020-02-21 ENCOUNTER — Other Ambulatory Visit: Payer: Self-pay

## 2020-02-21 ENCOUNTER — Telehealth: Payer: Self-pay | Admitting: *Deleted

## 2020-02-21 DIAGNOSIS — Z01810 Encounter for preprocedural cardiovascular examination: Secondary | ICD-10-CM

## 2020-02-21 DIAGNOSIS — I2510 Atherosclerotic heart disease of native coronary artery without angina pectoris: Secondary | ICD-10-CM | POA: Diagnosis not present

## 2020-02-21 LAB — ECHOCARDIOGRAM COMPLETE
AR max vel: 2.51 cm2
AV Area VTI: 2.72 cm2
AV Area mean vel: 2.59 cm2
AV Mean grad: 3.7 mmHg
AV Peak grad: 8.6 mmHg
Ao pk vel: 1.47 m/s
Area-P 1/2: 2.79 cm2
Calc EF: 55 %
MV M vel: 2.99 m/s
MV Peak grad: 35.6 mmHg
S' Lateral: 2.98 cm
Single Plane A2C EF: 57.2 %
Single Plane A4C EF: 55.2 %

## 2020-02-21 NOTE — Telephone Encounter (Signed)
Laurine Blazer, LPN  [id number] PM EST Back to Top     Notified, copy to pcp.

## 2020-02-21 NOTE — Telephone Encounter (Signed)
-----   Message from Verta Ellen., NP sent at 02/21/2020  4:09 PM EST ----- Please call the patient let him know the echocardiogram showed he has good pumping function of his heart.  The left main pumping chamber is a little thick and stiff.  Best managed by keeping blood pressure at or below 130/80 which will help.  He has a very mildly leaking mitral valve which is not clinically significant.  This is not uncommon with aging.  Thank you

## 2020-02-23 NOTE — Progress Notes (Unsigned)
Cardiology Office Note  Date: 02/24/2020   ID: Kevin [name], DOB 06-Apr-1930, MRN [id number]  PCP:  Glenda Chroman, MD  Cardiologist:  Rozann Lesches, MD Electrophysiologist:  None   Chief Complaint: Pre op clearance hernia surgery  History of Present Illness: Kevin Winquist Gonyer is a 87 y.o. male with a history of CAD (DES to LAD with known CTO distal RCA with L>R collateral and severe LCX), CKD, Chronic anemia, HTN, Claudication, HLD, NSTEMI.  Last encounter with Dr Domenic Polite on 07/08/2019. Had no anginal symptoms or NTG use. He was functional with his ADLs. He was symptomatically stable with CAD on current medical therapy. He was continuing ASA, Norvasc, Lipitor, Imdur, Lopressor and prn NTG. He was compliant with Lipitor. His last LDL was 139. Follow up plan for recheck of LDL later in the year. BP was well controlled. There were no changes to therapy. Last Crt 1.96 CKD stage IIIb.   Was here last visit for preop clearance for bilateral inguinal hernia surgery.  Referred by Dr. Blake Divine. History of coronary artery disease with DES to LAD and CTO of the distal RCA with left to right collaterals and severe LCx disease.   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 [facility]in [facility].   He presents for follow-up status post recent stress test and echocardiogram for preop risk stratification in preparation for hernia surgery with Dr. Constance Haw.  I reviewed the results of the stress test and echocardiogram with him.  He verbalizes understanding.  He denies any recent issues.  In fact he denies any discomfort from his bilateral inguinal hernia issues.  He states now he is reluctant to have surgery given the fact that he has no discomfort.  He denies any anginal or exertional symptoms, orthostatic symptoms, PND, orthopnea, abdominal pain, inguinal pain, constipation, black tarry stools or blood in urine.  Denies any CVA or TIA-like symptoms, DVT or PE-like symptoms, lower  extremity edema.  Stress test was negative for ischemia.    Past Medical History:  Diagnosis Date  . Arthritis   . CAD (coronary artery disease)    a. s/p DES to LAD (2014)  b. s/p LHC (09/2014) with patent LAD stent and stable dz from previous cath   . Chronic anemia   . CKD (chronic kidney disease), stage IV ([facility])   . Claudication ([place])    R leg with bilateral femoral bruits.  . Essential hypertension   . Hyperlipidemia   . NSTEMI (non-ST elevated myocardial infarction) [facility])     Past Surgical History:  Procedure Laterality Date  . Bilateral carpal tunnel release Bilateral   . CARDIAC CATHETERIZATION N/A 10/10/2014   Procedure: Left Heart Cath and Coronary Angiography;  Surgeon: Belva Crome, MD;  Location: [facility] [facility];  Service: Cardiovascular;  Laterality: N/A;  . LEFT HEART CATHETERIZATION WITH CORONARY ANGIOGRAM N/A 02/09/2012   Procedure: LEFT HEART CATHETERIZATION WITH CORONARY ANGIOGRAM;  Surgeon: Peter M Martinique, MD; left main 10%, LAD 80%/95%, D1 patent, CFX 70% at OM 2, dCFX 90%, RCA 100% with L-R collaterals    . PERCUTANEOUS CORONARY STENT INTERVENTION (PCI-S) N/A 02/11/2012   Procedure: PERCUTANEOUS CORONARY STENT INTERVENTION (PCI-S);  Surgeon: Burnell Blanks, MD; 3.0 x 28 mm Promus Premier DES to the mid LAD   . REPAIR OF PERFORATED ULCER      Current Outpatient Medications  Medication Sig Dispense Refill  . amLODipine (NORVASC) 10 MG tablet Take 10 mg by mouth daily.    Marland Kitchen  aspirin EC 81 MG tablet Take 81 mg by mouth daily.    Marland Kitchen atorvastatin (LIPITOR) 80 MG tablet Take 1 tablet (80 mg total) by mouth every evening. 90 tablet 3  . Cholecalciferol (VITAMIN D-3) 1000 UNITS CAPS Take 1,000 Units by mouth daily.     Marland Kitchen GOODSENSE CLEARLAX 17 GM/SCOOP powder Take 17 g by mouth daily.    Marland Kitchen guaiFENesin (MUCINEX) 600 MG 12 hr tablet Take 600 mg by mouth daily.    . hydrALAZINE (APRESOLINE) 100 MG tablet TAKE ONE TABLET BY MOUTH THREE TIMES DAILY 90 tablet 6  .  ibuprofen (ADVIL) 800 MG tablet Take 800 mg by mouth every 8 (eight) hours as needed.    . isosorbide mononitrate (IMDUR) 60 MG 24 hr tablet TAKE ONE TABLET BY MOUTH DAILY. 90 tablet 1  . loratadine (CLARITIN) 10 MG tablet Take 10 mg by mouth daily.  2  . metoprolol tartrate (LOPRESSOR) 25 MG tablet TAKE 1/2 TABLET BY MOUTH TWICE DAILY. 90 tablet 1  . nitroGLYCERIN (NITROSTAT) 0.4 MG SL tablet Place 1 tablet (0.4 mg total) under the tongue every 5 (five) minutes x 3 doses as needed for chest pain (if no relief after 3rd dose, proceed to the ED for an evaluation or call 911). 25 tablet 3  . Polysaccharide Iron Complex (POLY-IRON 150 PO) Take 150 mg by mouth daily.    . sodium bicarbonate 650 MG tablet Take 1 tablet (650 mg total) by mouth 2 (two) times daily. 60 tablet 0   No current facility-administered medications for this visit.   Allergies:  Patient has no known allergies.   Social History: The patient  reports that he quit smoking about 8 years ago. His smoking use included cigarettes. He started smoking about 52 years ago. He has a 22.50 pack-year smoking history. He has never used smokeless tobacco. He reports that he does not drink alcohol and does not use drugs.   Family History: The patient's family history includes Alzheimer's disease in his mother; Heart disease in his brother and sister; Hypertension in his mother; Other in his father.   ROS:  Please see the history of present illness. Otherwise, complete review of systems is positive for none.  All other systems are reviewed and negative.   Physical Exam: VS:  BP (!) 174/80   Pulse (!) 56   Ht '5\' 6"'$  (1.676 m)   Wt 202 lb (91.6 kg)   SpO2 98%   BMI 32.60 kg/m , BMI Body mass index is 32.6 kg/m.  Wt Readings from Last 3 Encounters:  02/24/20 202 lb (91.6 kg)  01/26/20 204 lb (92.5 kg)  01/23/20 204 lb (92.5 kg)    General: Patient appears comfortable at rest. Neck: Supple, no elevated JVP or carotid bruits, no  thyromegaly. Lungs: Clear to auscultation, nonlabored breathing at rest. Cardiac: Regular rate and rhythm, no S3 or significant systolic murmur, no pericardial rub. Extremities: No pitting edema, distal pulses 2+. Skin: Warm and dry. Musculoskeletal: No kyphosis. Neuropsychiatric: Alert and oriented x3, affect grossly appropriate.  ECG:  EKG 07/08/2019 sinus rhythm with intermittent ectopic atrial beats rate of 62.  Recent Labwork: No results found for requested labs within last 8760 hours.     Component Value Date/Time   CHOL 123 12/11/2015 0152   TRIG 94 12/11/2015 0152   HDL 29 (L) 12/11/2015 0152   CHOLHDL 4.2 12/11/2015 0152   VLDL 19 12/11/2015 0152   LDLCALC 75 12/11/2015 0152    Other Studies Reviewed  Today:   NST 01/30/2020 Study Result  Narrative & Impression    There was no ST segment deviation noted during stress.  The study is normal.  This is a low risk study.  The left ventricular ejection fraction is mildly decreased (45-54%).   Normal resting and stress perfusion. No ischemia or infarction EF 52% no RWMA;s some diaphragmatic attenuation at inferior base     Echocardiogram 02/21/2020 1. Left ventricular ejection fraction, by estimation, is 60 to 65%. The left ventricle has normal function. The left ventricle has no regional wall motion abnormalities. There is mild left ventricular hypertrophy. Left ventricular diastolic parameters are consistent with Grade I diastolic dysfunction (impaired relaxation). The average left ventricular global longitudinal strain is -18.0 %. The global longitudinal strain is normal. 2. Right ventricular systolic function is normal. The right ventricular size is normal. 3. Left atrial size was severely dilated. 4. Right atrial size was mildly dilated. 5. The mitral valve is normal in structure. Trivial mitral valve regurgitation. No evidence of mitral stenosis. 6. The aortic valve is tricuspid. There is mild calcification  of the aortic valve. There is mild thickening of the aortic valve. Aortic valve regurgitation is not visualized. No aortic stenosis is present. 7. The inferior vena cava is normal in size with greater than 50% respiratory variability, suggesting right atrial pressure of 3 mmHg. Comparison(s): Echocardiogram done 12/11/15 showed an EF of 55-60%.      Echocardiogram 12/11/2015 Study Conclusions  - Left ventricle: The cavity size was normal. Wall thickness was  increased in a pattern of mild LVH. Systolic function was normal.  The estimated ejection fraction was in the range of 55% to 60%.  Wall motion was normal; there were no regional wall motion  abnormalities. Doppler parameters are consistent with abnormal  left ventricular relaxation (grade 1 diastolic dysfunction).  - Aortic valve: There was trivial regurgitation.  - Mitral valve: There was mild regurgitation.  - Left atrium: The atrium was moderately dilated.  - Pulmonary arteries: Systolic pressure was mildly increased. PA  peak pressure: 32 mm Hg (S).   Impressions: - Normal LV systolic function; grade 1 diastolic dysfunction; trace  AI; mild MR; moderate LAE; trace TR with mildly elevated  pulmonary pressure.    Cardiac catheterization 10/10/2014 Conclusion  1. Mid RCA lesion, 100% stenosed. 2. Mid LAD to Dist LAD lesion, 10% stenosed. The lesion was previously treated with a stent (unknown type) . 3. 3rd Mrg lesion, 90% stenosed. 4. Dist RCA lesion, 100% stenosed. 5. 2nd Diag lesion, 75% stenosed. 6. Dist Cx-1 lesion, 75% stenosed. 7. Dist Cx-2 lesion, 65% stenosed.    Widely patent left anterior descending stent.  Total occlusion of the mid to distal RCA. Distal RCA is supplied by collaterals from the circumflex and LAD.  Highly diseased distal circumflex with a stable eccentric 75% stenosis proximal to the second obtuse marginal. There is diffuse disease beyond the second obtuse marginal and  90% stenosis and a small third obtuse marginal branch. This distal circumflex territory as the source of collaterals to the occluded right coronary. The anatomy is unchanged from 2014.  LV function is not performed. LV end-diastolic pressure is 10 mmHg.  During 5/10 chest discomfort that developed at completion of the case, repeat angiography revealed identical anatomy to that noted above.   RECOMMENDATIONS:   Re-cycle cardiac markers  Consider alternative explanations for the patient's chest discomfort, although the presence of ventricular ectopy during the pain is concerning. The absence of response to  sublingual nitroglycerin is also unusual if we consider the discomfort could be ischemia related.  Per treating team Diagnostic Dominance: Co-dominant        Assessment and Plan:    1. Encounter for pre-operative cardiovascular clearance Patient has pending bilateral inguinal surgery by Dr. Blake Divine.  He is here for preop clearance.  He currently denies any significant anginal or exertional symptoms.  He does have some significant CAD noted on last catheterization above.  Recent Lexiscan stress test showed no evidence of ischemia.  Recent echocardiogram showed no significant abnormalities.  EF was 60-65%.  No WMA's.  Mild LVH, G1 DD.  Trivial MR.  Patient has decided to defer hernia surgery for now.     2. CAD in native artery Previous DES to mid LAD to distal LAD.  Last cardiac catheterization 10/10/2014: Mid RCA 100% CTO.  Mid LAD to distal LAD, 10% stenosis.  Lesion with a previously placed stent.  Third marginal lesion 90%, distal RCA 100%, second diagonal 75%, distal CX 1 lesion 75%.  Distal CX 2 lesions 65%.  Denies any anginal or exertional symptoms.  Continue aspirin 81 mg daily.  Continue Imdur 60 mg daily.  Continue metoprolol 12.5 mg p.o. twice daily.  Continue nitroglycerin sublingual as needed  3. Essential hypertension, benign Blood pressure is elevated  here today.  States he took his blood pressure at the pharmacy this morning and it was 124/70.  Continue amlodipine 10 mg daily.  Continue hydralazine 100 mg 3 times daily.  4. Mixed hyperlipidemia Continue atorvastatin 80 mg daily.  Medication Adjustments/Labs and Tests Ordered: Current medicines are reviewed at length with the patient today.  Concerns regarding medicines are outlined above.   Disposition: Follow-up with Dr. Domenic Polite or APP 6 months  Signed, Levell July, NP 02/24/2020 2:27 PM    [facility], [place], [place] [address] Phone: [phone]; Fax: [phone]

## 2020-02-24 ENCOUNTER — Ambulatory Visit (INDEPENDENT_AMBULATORY_CARE_PROVIDER_SITE_OTHER): Payer: Medicare Other | Admitting: Family Medicine

## 2020-02-24 ENCOUNTER — Encounter: Payer: Self-pay | Admitting: Family Medicine

## 2020-02-24 ENCOUNTER — Other Ambulatory Visit: Payer: Self-pay

## 2020-02-24 ENCOUNTER — Encounter: Payer: Self-pay | Admitting: *Deleted

## 2020-02-24 VITALS — BP 174/80 | HR 56 | Ht 66.0 in | Wt 202.0 lb

## 2020-02-24 DIAGNOSIS — Z01818 Encounter for other preprocedural examination: Secondary | ICD-10-CM

## 2020-02-24 DIAGNOSIS — E782 Mixed hyperlipidemia: Secondary | ICD-10-CM | POA: Diagnosis not present

## 2020-02-24 DIAGNOSIS — I10 Essential (primary) hypertension: Secondary | ICD-10-CM

## 2020-02-24 DIAGNOSIS — I2510 Atherosclerotic heart disease of native coronary artery without angina pectoris: Secondary | ICD-10-CM

## 2020-02-24 NOTE — Patient Instructions (Signed)
Medication Instructions:  Continue all current medications.   Labwork: none  Testing/Procedures: none  Follow-Up: 6 months   Any Other Special Instructions Will Be Listed Below (If Applicable).   If you need a refill on your cardiac medications before your next appointment, please call your pharmacy.  

## 2020-03-23 DIAGNOSIS — E211 Secondary hyperparathyroidism, not elsewhere classified: Secondary | ICD-10-CM | POA: Diagnosis not present

## 2020-03-23 DIAGNOSIS — I129 Hypertensive chronic kidney disease with stage 1 through stage 4 chronic kidney disease, or unspecified chronic kidney disease: Secondary | ICD-10-CM | POA: Diagnosis not present

## 2020-03-23 DIAGNOSIS — D631 Anemia in chronic kidney disease: Secondary | ICD-10-CM | POA: Diagnosis not present

## 2020-03-23 DIAGNOSIS — R809 Proteinuria, unspecified: Secondary | ICD-10-CM | POA: Diagnosis not present

## 2020-03-23 DIAGNOSIS — N189 Chronic kidney disease, unspecified: Secondary | ICD-10-CM | POA: Diagnosis not present

## 2020-03-23 DIAGNOSIS — E872 Acidosis: Secondary | ICD-10-CM | POA: Diagnosis not present

## 2020-03-23 DIAGNOSIS — E559 Vitamin D deficiency, unspecified: Secondary | ICD-10-CM | POA: Diagnosis not present

## 2020-03-23 DIAGNOSIS — N1832 Chronic kidney disease, stage 3b: Secondary | ICD-10-CM | POA: Diagnosis not present

## 2020-03-27 DIAGNOSIS — M79675 Pain in left toe(s): Secondary | ICD-10-CM | POA: Diagnosis not present

## 2020-03-27 DIAGNOSIS — I739 Peripheral vascular disease, unspecified: Secondary | ICD-10-CM | POA: Diagnosis not present

## 2020-03-27 DIAGNOSIS — M79672 Pain in left foot: Secondary | ICD-10-CM | POA: Diagnosis not present

## 2020-03-27 DIAGNOSIS — M79671 Pain in right foot: Secondary | ICD-10-CM | POA: Diagnosis not present

## 2020-03-27 DIAGNOSIS — M79674 Pain in right toe(s): Secondary | ICD-10-CM | POA: Diagnosis not present

## 2020-04-04 DIAGNOSIS — N1832 Chronic kidney disease, stage 3b: Secondary | ICD-10-CM | POA: Diagnosis not present

## 2020-04-04 DIAGNOSIS — I129 Hypertensive chronic kidney disease with stage 1 through stage 4 chronic kidney disease, or unspecified chronic kidney disease: Secondary | ICD-10-CM | POA: Diagnosis not present

## 2020-04-04 DIAGNOSIS — R809 Proteinuria, unspecified: Secondary | ICD-10-CM | POA: Diagnosis not present

## 2020-04-04 DIAGNOSIS — E211 Secondary hyperparathyroidism, not elsewhere classified: Secondary | ICD-10-CM | POA: Diagnosis not present

## 2020-05-14 DIAGNOSIS — I509 Heart failure, unspecified: Secondary | ICD-10-CM | POA: Diagnosis not present

## 2020-05-14 DIAGNOSIS — I714 Abdominal aortic aneurysm, without rupture: Secondary | ICD-10-CM | POA: Diagnosis not present

## 2020-05-14 DIAGNOSIS — I10 Essential (primary) hypertension: Secondary | ICD-10-CM | POA: Diagnosis not present

## 2020-05-14 DIAGNOSIS — Z299 Encounter for prophylactic measures, unspecified: Secondary | ICD-10-CM | POA: Diagnosis not present

## 2020-05-14 DIAGNOSIS — M545 Low back pain, unspecified: Secondary | ICD-10-CM | POA: Diagnosis not present

## 2020-05-24 DIAGNOSIS — N1832 Chronic kidney disease, stage 3b: Secondary | ICD-10-CM | POA: Diagnosis not present

## 2020-05-24 DIAGNOSIS — R809 Proteinuria, unspecified: Secondary | ICD-10-CM | POA: Diagnosis not present

## 2020-05-24 DIAGNOSIS — E211 Secondary hyperparathyroidism, not elsewhere classified: Secondary | ICD-10-CM | POA: Diagnosis not present

## 2020-05-24 DIAGNOSIS — I129 Hypertensive chronic kidney disease with stage 1 through stage 4 chronic kidney disease, or unspecified chronic kidney disease: Secondary | ICD-10-CM | POA: Diagnosis not present

## 2020-05-26 DIAGNOSIS — I2510 Atherosclerotic heart disease of native coronary artery without angina pectoris: Secondary | ICD-10-CM | POA: Diagnosis not present

## 2020-05-26 DIAGNOSIS — I509 Heart failure, unspecified: Secondary | ICD-10-CM | POA: Diagnosis not present

## 2020-05-26 DIAGNOSIS — N1832 Chronic kidney disease, stage 3b: Secondary | ICD-10-CM | POA: Diagnosis not present

## 2020-05-26 DIAGNOSIS — I10 Essential (primary) hypertension: Secondary | ICD-10-CM | POA: Diagnosis not present

## 2020-06-06 DIAGNOSIS — E211 Secondary hyperparathyroidism, not elsewhere classified: Secondary | ICD-10-CM | POA: Diagnosis not present

## 2020-06-06 DIAGNOSIS — D631 Anemia in chronic kidney disease: Secondary | ICD-10-CM | POA: Diagnosis not present

## 2020-06-06 DIAGNOSIS — N1832 Chronic kidney disease, stage 3b: Secondary | ICD-10-CM | POA: Diagnosis not present

## 2020-06-06 DIAGNOSIS — N189 Chronic kidney disease, unspecified: Secondary | ICD-10-CM | POA: Diagnosis not present

## 2020-06-06 DIAGNOSIS — I129 Hypertensive chronic kidney disease with stage 1 through stage 4 chronic kidney disease, or unspecified chronic kidney disease: Secondary | ICD-10-CM | POA: Diagnosis not present

## 2020-06-06 DIAGNOSIS — R809 Proteinuria, unspecified: Secondary | ICD-10-CM | POA: Diagnosis not present

## 2020-06-19 DIAGNOSIS — M79672 Pain in left foot: Secondary | ICD-10-CM | POA: Diagnosis not present

## 2020-06-19 DIAGNOSIS — M79674 Pain in right toe(s): Secondary | ICD-10-CM | POA: Diagnosis not present

## 2020-06-19 DIAGNOSIS — I739 Peripheral vascular disease, unspecified: Secondary | ICD-10-CM | POA: Diagnosis not present

## 2020-06-19 DIAGNOSIS — M79671 Pain in right foot: Secondary | ICD-10-CM | POA: Diagnosis not present

## 2020-06-19 DIAGNOSIS — M79675 Pain in left toe(s): Secondary | ICD-10-CM | POA: Diagnosis not present

## 2020-06-20 DIAGNOSIS — N1832 Chronic kidney disease, stage 3b: Secondary | ICD-10-CM | POA: Diagnosis not present

## 2020-06-20 DIAGNOSIS — E211 Secondary hyperparathyroidism, not elsewhere classified: Secondary | ICD-10-CM | POA: Diagnosis not present

## 2020-06-20 DIAGNOSIS — I129 Hypertensive chronic kidney disease with stage 1 through stage 4 chronic kidney disease, or unspecified chronic kidney disease: Secondary | ICD-10-CM | POA: Diagnosis not present

## 2020-06-20 DIAGNOSIS — R809 Proteinuria, unspecified: Secondary | ICD-10-CM | POA: Diagnosis not present

## 2020-07-12 ENCOUNTER — Other Ambulatory Visit: Payer: Self-pay | Admitting: Cardiology

## 2020-07-12 DIAGNOSIS — R5383 Other fatigue: Secondary | ICD-10-CM | POA: Diagnosis not present

## 2020-07-12 DIAGNOSIS — E7800 Pure hypercholesterolemia, unspecified: Secondary | ICD-10-CM | POA: Diagnosis not present

## 2020-07-12 DIAGNOSIS — I10 Essential (primary) hypertension: Secondary | ICD-10-CM | POA: Diagnosis not present

## 2020-07-12 DIAGNOSIS — Z0000 Encounter for general adult medical examination without abnormal findings: Secondary | ICD-10-CM | POA: Diagnosis not present

## 2020-07-12 DIAGNOSIS — Z7189 Other specified counseling: Secondary | ICD-10-CM | POA: Diagnosis not present

## 2020-07-12 DIAGNOSIS — Z299 Encounter for prophylactic measures, unspecified: Secondary | ICD-10-CM | POA: Diagnosis not present

## 2020-07-12 DIAGNOSIS — Z79899 Other long term (current) drug therapy: Secondary | ICD-10-CM | POA: Diagnosis not present

## 2020-07-19 DIAGNOSIS — H524 Presbyopia: Secondary | ICD-10-CM | POA: Diagnosis not present

## 2020-07-19 DIAGNOSIS — H40023 Open angle with borderline findings, high risk, bilateral: Secondary | ICD-10-CM | POA: Diagnosis not present

## 2020-07-27 DIAGNOSIS — N1832 Chronic kidney disease, stage 3b: Secondary | ICD-10-CM | POA: Diagnosis not present

## 2020-07-27 DIAGNOSIS — E211 Secondary hyperparathyroidism, not elsewhere classified: Secondary | ICD-10-CM | POA: Diagnosis not present

## 2020-07-27 DIAGNOSIS — R809 Proteinuria, unspecified: Secondary | ICD-10-CM | POA: Diagnosis not present

## 2020-07-27 DIAGNOSIS — I129 Hypertensive chronic kidney disease with stage 1 through stage 4 chronic kidney disease, or unspecified chronic kidney disease: Secondary | ICD-10-CM | POA: Diagnosis not present

## 2020-08-08 DIAGNOSIS — E875 Hyperkalemia: Secondary | ICD-10-CM | POA: Diagnosis not present

## 2020-08-08 DIAGNOSIS — N1832 Chronic kidney disease, stage 3b: Secondary | ICD-10-CM | POA: Diagnosis not present

## 2020-08-08 DIAGNOSIS — I129 Hypertensive chronic kidney disease with stage 1 through stage 4 chronic kidney disease, or unspecified chronic kidney disease: Secondary | ICD-10-CM | POA: Diagnosis not present

## 2020-08-08 DIAGNOSIS — E211 Secondary hyperparathyroidism, not elsewhere classified: Secondary | ICD-10-CM | POA: Diagnosis not present

## 2020-08-08 DIAGNOSIS — R809 Proteinuria, unspecified: Secondary | ICD-10-CM | POA: Diagnosis not present

## 2020-08-15 DIAGNOSIS — N1832 Chronic kidney disease, stage 3b: Secondary | ICD-10-CM | POA: Diagnosis not present

## 2020-08-21 ENCOUNTER — Other Ambulatory Visit: Payer: Self-pay

## 2020-08-21 ENCOUNTER — Encounter: Payer: Self-pay | Admitting: Cardiology

## 2020-08-21 ENCOUNTER — Ambulatory Visit (INDEPENDENT_AMBULATORY_CARE_PROVIDER_SITE_OTHER): Payer: Medicare Other | Admitting: Cardiology

## 2020-08-21 VITALS — BP 160/62 | HR 70 | Ht 66.0 in | Wt 208.4 lb

## 2020-08-21 DIAGNOSIS — I25119 Atherosclerotic heart disease of native coronary artery with unspecified angina pectoris: Secondary | ICD-10-CM | POA: Diagnosis not present

## 2020-08-21 DIAGNOSIS — I10 Essential (primary) hypertension: Secondary | ICD-10-CM

## 2020-08-21 DIAGNOSIS — N184 Chronic kidney disease, stage 4 (severe): Secondary | ICD-10-CM | POA: Diagnosis not present

## 2020-08-21 MED ORDER — NITROGLYCERIN 0.4 MG SL SUBL: 0.4000 mg | SUBLINGUAL_TABLET | SUBLINGUAL | 3 refills | Status: DC | PRN

## 2020-08-21 NOTE — Progress Notes (Signed)
Cardiology Office Note  Date: 08/21/2020   ID: Kevin [name], DOB 1930-02-05, MRN [id number]  PCP:  Glenda Chroman, MD  Cardiologist:  Rozann Lesches, MD Electrophysiologist:  None   Chief Complaint  Patient presents with   Cardiac follow-up    History of Present Illness: Kevin [name] is an 87 y.o. male last seen in January by Mr. Leonides Sake NP.  He is here today with family member for a follow-up visit.  He states that he has done about the same in terms of shortness of breath, no angina or nitroglycerin use reported.  Functional with ADLs.  He celebrated his 61th birthday in May.  Also went to [facility] for 4 July.  Follow-up cardiac testing including echocardiogram and Myoview from January of this year are outlined below.  He did not have any large ischemic territories and LVEF is normal at 60 to 65%.  Plan is to continue medical therapy.  He continues to follow with Dr. Theador Hawthorne with CKD stage IIIb-IV.  Recent lab work showed potassium 5.1 and creatinine 2.18.  I reviewed his medications.  ECG today shows sinus rhythm with ventricular bigeminy.  Past Medical History:  Diagnosis Date   Arthritis    CAD (coronary artery disease)    a. s/p DES to LAD (2014)  b. s/p LHC (09/2014) with patent LAD stent and stable dz from previous cath    Chronic anemia    CKD (chronic kidney disease), stage IV ([facility])    Claudication ([facility])    R leg with bilateral femoral bruits.   Essential hypertension    Hyperlipidemia    NSTEMI (non-ST elevated myocardial infarction) [facility])     Past Surgical History:  Procedure Laterality Date   Bilateral carpal tunnel release Bilateral    CARDIAC CATHETERIZATION N/A 10/10/2014   Procedure: Left Heart Cath and Coronary Angiography;  Surgeon: Belva Crome, MD;  Location: [place] [facility];  Service: Cardiovascular;  Laterality: N/A;   LEFT HEART CATHETERIZATION WITH CORONARY ANGIOGRAM N/A 02/09/2012   Procedure: LEFT HEART CATHETERIZATION WITH CORONARY  ANGIOGRAM;  Surgeon: Peter M Martinique, MD; left main 10%, LAD 80%/95%, D1 patent, CFX 70% at OM 2, dCFX 90%, RCA 100% with L-R collaterals     PERCUTANEOUS CORONARY STENT INTERVENTION (PCI-S) N/A 02/11/2012   Procedure: PERCUTANEOUS CORONARY STENT INTERVENTION (PCI-S);  Surgeon: Burnell Blanks, MD; 3.0 x 28 mm Promus Premier DES to the mid LAD    REPAIR OF PERFORATED ULCER      Current Outpatient Medications  Medication Sig Dispense Refill   acetaminophen (TYLENOL) 500 MG tablet Take 500 mg by mouth as needed for mild pain or fever.     amLODipine (NORVASC) 10 MG tablet Take 10 mg by mouth daily.     aspirin EC 81 MG tablet Take 81 mg by mouth daily.     atorvastatin (LIPITOR) 80 MG tablet Take 1 tablet (80 mg total) by mouth every evening. 90 tablet 3   calcitRIOL (ROCALTROL) 0.25 MCG capsule Take 0.25 mcg by mouth 3 (three) times a week.     Cholecalciferol (VITAMIN D-3) 1000 UNITS CAPS Take 1,000 Units by mouth daily.      guaiFENesin (MUCINEX) 600 MG 12 hr tablet Take 600 mg by mouth daily.     hydrALAZINE (APRESOLINE) 100 MG tablet TAKE ONE TABLET BY MOUTH THREE TIMES DAILY 270 tablet 3   iron polysaccharides (NIFEREX) 150 MG capsule Take 150 mg by mouth daily.  isosorbide mononitrate (IMDUR) 60 MG 24 hr tablet Take 60 mg by mouth daily.     loratadine (CLARITIN) 10 MG tablet Take 10 mg by mouth daily.  2   losartan (COZAAR) 25 MG tablet Take 1/2 tablet by mouth (12.5 total) daily     metoprolol tartrate (LOPRESSOR) 25 MG tablet TAKE 1/2 TABLET BY MOUTH TWICE DAILY. 90 tablet 1   Polysaccharide Iron Complex (POLY-IRON 150 PO) Take 150 mg by mouth daily.     sodium bicarbonate 650 MG tablet Take 1 tablet (650 mg total) by mouth 2 (two) times daily. 60 tablet 0   nitroGLYCERIN (NITROSTAT) 0.4 MG SL tablet Place 1 tablet (0.4 mg total) under the tongue every 5 (five) minutes x 3 doses as needed for chest pain (if no relief after 3rd dose, proceed to the ED for an evaluation or call  911). 25 tablet 3   No current facility-administered medications for this visit.   Allergies:  Patient has no known allergies.   ROS: No palpitations or syncope.  Physical Exam: VS:  BP (!) 160/62   Pulse 70   Ht '5\' 6"'$  (1.676 m)   Wt 208 lb 6.4 oz (94.5 kg)   SpO2 95%   BMI 33.64 kg/m , BMI Body mass index is 33.64 kg/m.  Wt Readings from Last 3 Encounters:  08/21/20 208 lb 6.4 oz (94.5 kg)  02/24/20 202 lb (91.6 kg)  01/26/20 204 lb (92.5 kg)    General: Elderly male, appears comfortable at rest. HEENT: Conjunctiva and lids normal, wearing a mask. Neck: Supple, no elevated JVP or carotid bruits, no thyromegaly. Lungs: Clear to auscultation, nonlabored breathing at rest. Cardiac: Regular rate and rhythm, no S3 or significant systolic murmur, no pericardial rub. Extremities: Trace ankle edema.  ECG:  An ECG dated 07/08/2019 was personally reviewed today and demonstrated:  Sinus rhythm with occasional ectopic atrial beats and nonspecific T wave changes.  Recent Labwork:  September 2021: Cholesterol 175, triglycerides 75, HDL 47, LDL 114 July 2022: Potassium 4.9, hemoglobin 12.6, platelets 169, BUN 31, creatinine 2.18  Other Studies Reviewed Today:  Lexiscan Myoview 01/30/2020: There was no ST segment deviation noted during stress. The study is normal. This is a low risk study. The left ventricular ejection fraction is mildly decreased (45-54%).   Normal resting and stress perfusion. No ischemia or infarction EF 52% no RWMA;s some diaphragmatic attenuation at inferior base  Echocardiogram 02/21/2020:  1. Left ventricular ejection fraction, by estimation, is 60 to 65%. The  left ventricle has normal function. The left ventricle has no regional  wall motion abnormalities. There is mild left ventricular hypertrophy.  Left ventricular diastolic parameters  are consistent with Grade I diastolic dysfunction (impaired relaxation).  The average left ventricular global  longitudinal strain is -18.0 %. The  global longitudinal strain is normal.   2. Right ventricular systolic function is normal. The right ventricular  size is normal.   3. Left atrial size was severely dilated.   4. Right atrial size was mildly dilated.   5. The mitral valve is normal in structure. Trivial mitral valve  regurgitation. No evidence of mitral stenosis.   6. The aortic valve is tricuspid. There is mild calcification of the  aortic valve. There is mild thickening of the aortic valve. Aortic valve  regurgitation is not visualized. No aortic stenosis is present.   7. The inferior vena cava is normal in size with greater than 50%  respiratory variability, suggesting right atrial pressure of  3 mmHg.   Assessment and Plan:  1.  Multivessel CAD status post DES to the LAD in 2014 with known occlusion of the mid to distal RCA associated with left-to-right collaterals as well as severe circumflex disease.  We have managed him medically and he did undergo a follow-up Myoview earlier this year that showed no large ischemic territories.  LVEF is also normal at 60 to 65% by echocardiography.  Continue aspirin, Norvasc, Lipitor, Lopressor, hydralazine, Imdur, losartan, and as needed nitroglycerin which will be refilled for fresh bottle.  2.  CKD stage IIIb-IV, following with Dr. Theador Hawthorne.  Recent creatinine 2.18.  3.  Essential hypertension, continue multimodal therapy.  4.  Mixed hyperlipidemia, continues on Lipitor.  Medication Adjustments/Labs and Tests Ordered: Current medicines are reviewed at length with the patient today.  Concerns regarding medicines are outlined above.   Tests Ordered: Orders Placed This Encounter  Procedures   EKG 12-Lead     Medication Changes: Meds ordered this encounter  Medications   nitroGLYCERIN (NITROSTAT) 0.4 MG SL tablet    Sig: Place 1 tablet (0.4 mg total) under the tongue every 5 (five) minutes x 3 doses as needed for chest pain (if no relief  after 3rd dose, proceed to the ED for an evaluation or call 911).    Dispense:  25 tablet    Refill:  3     Disposition:  Follow up  6 months.  Signed, Satira Sark, MD, [facility] 08/21/2020 2:15 PM    [facility] [place], [place], [place] [address] Phone: [phone]; Fax: [phone]

## 2020-08-21 NOTE — Patient Instructions (Addendum)

## 2020-08-22 ENCOUNTER — Other Ambulatory Visit: Payer: Self-pay | Admitting: Cardiology

## 2020-08-22 ENCOUNTER — Telehealth: Payer: Self-pay | Admitting: Cardiology

## 2020-08-22 DIAGNOSIS — I25119 Atherosclerotic heart disease of native coronary artery with unspecified angina pectoris: Secondary | ICD-10-CM

## 2020-08-22 MED ORDER — NITROGLYCERIN 0.4 MG SL SUBL: 0.4000 mg | SUBLINGUAL_TABLET | SUBLINGUAL | 3 refills | Status: DC | PRN

## 2020-08-22 NOTE — Telephone Encounter (Signed)
Pharmacy stated they didn't get the refill requested    *STAT* If patient is at the pharmacy, call can be transferred to refill team.   1. Which medications need to be refilled? (please list name of each medication and dose if known) nitroGLYCERIN (NITROSTAT) 0.4 MG SL tablet  2. Which pharmacy/location (including street and city if local pharmacy) is medication to be sent to? [facility] - [place], [place] [facility] [address]  3. Do they need a 30 day or 90 day supply? [place]

## 2020-08-26 DIAGNOSIS — N1832 Chronic kidney disease, stage 3b: Secondary | ICD-10-CM | POA: Diagnosis not present

## 2020-08-26 DIAGNOSIS — I509 Heart failure, unspecified: Secondary | ICD-10-CM | POA: Diagnosis not present

## 2020-08-26 DIAGNOSIS — I10 Essential (primary) hypertension: Secondary | ICD-10-CM | POA: Diagnosis not present

## 2020-08-26 DIAGNOSIS — I2510 Atherosclerotic heart disease of native coronary artery without angina pectoris: Secondary | ICD-10-CM | POA: Diagnosis not present

## 2020-08-27 DIAGNOSIS — H402231 Chronic angle-closure glaucoma, bilateral, mild stage: Secondary | ICD-10-CM | POA: Diagnosis not present

## 2020-09-10 DIAGNOSIS — Z299 Encounter for prophylactic measures, unspecified: Secondary | ICD-10-CM | POA: Diagnosis not present

## 2020-09-10 DIAGNOSIS — I10 Essential (primary) hypertension: Secondary | ICD-10-CM | POA: Diagnosis not present

## 2020-09-10 DIAGNOSIS — I509 Heart failure, unspecified: Secondary | ICD-10-CM | POA: Diagnosis not present

## 2020-09-11 DIAGNOSIS — I739 Peripheral vascular disease, unspecified: Secondary | ICD-10-CM | POA: Diagnosis not present

## 2020-09-11 DIAGNOSIS — M79675 Pain in left toe(s): Secondary | ICD-10-CM | POA: Diagnosis not present

## 2020-09-11 DIAGNOSIS — M79672 Pain in left foot: Secondary | ICD-10-CM | POA: Diagnosis not present

## 2020-09-11 DIAGNOSIS — M79674 Pain in right toe(s): Secondary | ICD-10-CM | POA: Diagnosis not present

## 2020-09-11 DIAGNOSIS — M79671 Pain in right foot: Secondary | ICD-10-CM | POA: Diagnosis not present

## 2020-09-19 DIAGNOSIS — H401123 Primary open-angle glaucoma, left eye, severe stage: Secondary | ICD-10-CM | POA: Diagnosis not present

## 2020-09-19 DIAGNOSIS — H40011 Open angle with borderline findings, low risk, right eye: Secondary | ICD-10-CM | POA: Diagnosis not present

## 2020-10-02 DIAGNOSIS — E875 Hyperkalemia: Secondary | ICD-10-CM | POA: Diagnosis not present

## 2020-10-02 DIAGNOSIS — R809 Proteinuria, unspecified: Secondary | ICD-10-CM | POA: Diagnosis not present

## 2020-10-02 DIAGNOSIS — N1832 Chronic kidney disease, stage 3b: Secondary | ICD-10-CM | POA: Diagnosis not present

## 2020-10-02 DIAGNOSIS — I129 Hypertensive chronic kidney disease with stage 1 through stage 4 chronic kidney disease, or unspecified chronic kidney disease: Secondary | ICD-10-CM | POA: Diagnosis not present

## 2020-10-02 DIAGNOSIS — E211 Secondary hyperparathyroidism, not elsewhere classified: Secondary | ICD-10-CM | POA: Diagnosis not present

## 2020-10-10 ENCOUNTER — Other Ambulatory Visit (HOSPITAL_COMMUNITY): Payer: Self-pay | Admitting: Nephrology

## 2020-10-10 DIAGNOSIS — I10 Essential (primary) hypertension: Secondary | ICD-10-CM

## 2020-10-10 DIAGNOSIS — R809 Proteinuria, unspecified: Secondary | ICD-10-CM | POA: Diagnosis not present

## 2020-10-10 DIAGNOSIS — D631 Anemia in chronic kidney disease: Secondary | ICD-10-CM | POA: Diagnosis not present

## 2020-10-10 DIAGNOSIS — I129 Hypertensive chronic kidney disease with stage 1 through stage 4 chronic kidney disease, or unspecified chronic kidney disease: Secondary | ICD-10-CM | POA: Diagnosis not present

## 2020-10-10 DIAGNOSIS — E875 Hyperkalemia: Secondary | ICD-10-CM | POA: Diagnosis not present

## 2020-10-10 DIAGNOSIS — N1832 Chronic kidney disease, stage 3b: Secondary | ICD-10-CM | POA: Diagnosis not present

## 2020-10-10 DIAGNOSIS — R718 Other abnormality of red blood cells: Secondary | ICD-10-CM | POA: Diagnosis not present

## 2020-10-10 DIAGNOSIS — N189 Chronic kidney disease, unspecified: Secondary | ICD-10-CM | POA: Diagnosis not present

## 2020-10-10 DIAGNOSIS — R600 Localized edema: Secondary | ICD-10-CM | POA: Diagnosis not present

## 2020-10-24 DIAGNOSIS — H401123 Primary open-angle glaucoma, left eye, severe stage: Secondary | ICD-10-CM | POA: Diagnosis not present

## 2020-10-24 DIAGNOSIS — H40011 Open angle with borderline findings, low risk, right eye: Secondary | ICD-10-CM | POA: Diagnosis not present

## 2020-10-31 DIAGNOSIS — N1832 Chronic kidney disease, stage 3b: Secondary | ICD-10-CM | POA: Diagnosis not present

## 2020-11-02 ENCOUNTER — Ambulatory Visit (HOSPITAL_COMMUNITY)
Admission: RE | Admit: 2020-11-02 | Discharge: 2020-11-02 | Disposition: A | Discharge status: Home / Self Care | Payer: Medicare Other | Source: Ambulatory Visit | Attending: Internal Medicine | Admitting: Internal Medicine

## 2020-11-02 DIAGNOSIS — I10 Essential (primary) hypertension: Secondary | ICD-10-CM | POA: Diagnosis not present

## 2020-11-02 LAB — ECHOCARDIOGRAM COMPLETE
AR max vel: 2.34 cm2
AV Area VTI: 2.36 cm2
AV Area mean vel: 2.32 cm2
AV Mean grad: 4 mmHg
AV Peak grad: 8.2 mmHg
Ao pk vel: 1.43 m/s
Area-P 1/2: 3.27 cm2
S' Lateral: 3.6 cm

## 2020-11-02 NOTE — Progress Notes (Signed)
*  PRELIMINARY RESULTS* Echocardiogram 2D Echocardiogram has been performed.  Kevin [name] 11/02/2020, 3:06 PM

## 2020-11-09 DIAGNOSIS — E211 Secondary hyperparathyroidism, not elsewhere classified: Secondary | ICD-10-CM | POA: Diagnosis not present

## 2020-11-09 DIAGNOSIS — N184 Chronic kidney disease, stage 4 (severe): Secondary | ICD-10-CM | POA: Diagnosis not present

## 2020-11-09 DIAGNOSIS — I5032 Chronic diastolic (congestive) heart failure: Secondary | ICD-10-CM | POA: Diagnosis not present

## 2020-11-09 DIAGNOSIS — N189 Chronic kidney disease, unspecified: Secondary | ICD-10-CM | POA: Diagnosis not present

## 2020-11-09 DIAGNOSIS — R809 Proteinuria, unspecified: Secondary | ICD-10-CM | POA: Diagnosis not present

## 2020-11-09 DIAGNOSIS — D631 Anemia in chronic kidney disease: Secondary | ICD-10-CM | POA: Diagnosis not present

## 2020-11-09 DIAGNOSIS — I129 Hypertensive chronic kidney disease with stage 1 through stage 4 chronic kidney disease, or unspecified chronic kidney disease: Secondary | ICD-10-CM | POA: Diagnosis not present

## 2020-11-27 DIAGNOSIS — M79672 Pain in left foot: Secondary | ICD-10-CM | POA: Diagnosis not present

## 2020-11-27 DIAGNOSIS — M79675 Pain in left toe(s): Secondary | ICD-10-CM | POA: Diagnosis not present

## 2020-11-27 DIAGNOSIS — I739 Peripheral vascular disease, unspecified: Secondary | ICD-10-CM | POA: Diagnosis not present

## 2020-11-27 DIAGNOSIS — M79674 Pain in right toe(s): Secondary | ICD-10-CM | POA: Diagnosis not present

## 2020-11-27 DIAGNOSIS — M79671 Pain in right foot: Secondary | ICD-10-CM | POA: Diagnosis not present

## 2020-12-11 ENCOUNTER — Other Ambulatory Visit: Payer: Self-pay | Admitting: Cardiology

## 2021-01-03 DIAGNOSIS — N184 Chronic kidney disease, stage 4 (severe): Secondary | ICD-10-CM | POA: Diagnosis not present

## 2021-01-11 DIAGNOSIS — I5032 Chronic diastolic (congestive) heart failure: Secondary | ICD-10-CM | POA: Diagnosis not present

## 2021-01-11 DIAGNOSIS — I129 Hypertensive chronic kidney disease with stage 1 through stage 4 chronic kidney disease, or unspecified chronic kidney disease: Secondary | ICD-10-CM | POA: Diagnosis not present

## 2021-01-11 DIAGNOSIS — R809 Proteinuria, unspecified: Secondary | ICD-10-CM | POA: Diagnosis not present

## 2021-01-11 DIAGNOSIS — N184 Chronic kidney disease, stage 4 (severe): Secondary | ICD-10-CM | POA: Diagnosis not present

## 2021-01-11 DIAGNOSIS — E211 Secondary hyperparathyroidism, not elsewhere classified: Secondary | ICD-10-CM | POA: Diagnosis not present

## 2021-01-11 DIAGNOSIS — N189 Chronic kidney disease, unspecified: Secondary | ICD-10-CM | POA: Diagnosis not present

## 2021-01-11 DIAGNOSIS — D631 Anemia in chronic kidney disease: Secondary | ICD-10-CM | POA: Diagnosis not present

## 2021-02-05 DIAGNOSIS — M79675 Pain in left toe(s): Secondary | ICD-10-CM | POA: Diagnosis not present

## 2021-02-05 DIAGNOSIS — I700 Atherosclerosis of aorta: Secondary | ICD-10-CM | POA: Diagnosis not present

## 2021-02-05 DIAGNOSIS — Z299 Encounter for prophylactic measures, unspecified: Secondary | ICD-10-CM | POA: Diagnosis not present

## 2021-02-05 DIAGNOSIS — N1832 Chronic kidney disease, stage 3b: Secondary | ICD-10-CM | POA: Diagnosis not present

## 2021-02-05 DIAGNOSIS — D649 Anemia, unspecified: Secondary | ICD-10-CM | POA: Diagnosis not present

## 2021-02-05 DIAGNOSIS — M79672 Pain in left foot: Secondary | ICD-10-CM | POA: Diagnosis not present

## 2021-02-05 DIAGNOSIS — M79671 Pain in right foot: Secondary | ICD-10-CM | POA: Diagnosis not present

## 2021-02-05 DIAGNOSIS — I10 Essential (primary) hypertension: Secondary | ICD-10-CM | POA: Diagnosis not present

## 2021-02-05 DIAGNOSIS — M79674 Pain in right toe(s): Secondary | ICD-10-CM | POA: Diagnosis not present

## 2021-02-05 DIAGNOSIS — I739 Peripheral vascular disease, unspecified: Secondary | ICD-10-CM | POA: Diagnosis not present

## 2021-02-26 ENCOUNTER — Encounter: Payer: Self-pay | Admitting: *Deleted

## 2021-02-26 ENCOUNTER — Encounter: Payer: Self-pay | Admitting: Cardiology

## 2021-02-26 ENCOUNTER — Ambulatory Visit (INDEPENDENT_AMBULATORY_CARE_PROVIDER_SITE_OTHER): Payer: Medicare Other | Admitting: Cardiology

## 2021-02-26 VITALS — BP 138/74 | HR 60 | Ht 66.0 in | Wt 210.0 lb

## 2021-02-26 DIAGNOSIS — N184 Chronic kidney disease, stage 4 (severe): Secondary | ICD-10-CM

## 2021-02-26 DIAGNOSIS — I25119 Atherosclerotic heart disease of native coronary artery with unspecified angina pectoris: Secondary | ICD-10-CM | POA: Diagnosis not present

## 2021-02-26 DIAGNOSIS — I10 Essential (primary) hypertension: Secondary | ICD-10-CM | POA: Diagnosis not present

## 2021-02-26 DIAGNOSIS — E782 Mixed hyperlipidemia: Secondary | ICD-10-CM | POA: Diagnosis not present

## 2021-02-26 NOTE — Progress Notes (Signed)
Cardiology Office Note  Date: 02/26/2021   ID: Kevin [name], DOB 1930-11-05, MRN [id number]  PCP:  Glenda Chroman, MD  Cardiologist:  Rozann Lesches, MD Electrophysiologist:  None   Chief Complaint  Patient presents with   Cardiac follow-up    History of Present Illness: Kevin [name] is an 88 y.o. male last seen in July 2022.  He is here for a follow-up visit.  He reports no change in chronic shortness of breath, NYHA class II-III.  He specifically does not report any active angina or nitroglycerin use.  I reviewed his medications which are noted below.  He states that he has been compliant with therapy and just recently had follow-up lab work with Dr. Woody Seller which we are requesting for review.  He continues to follow with Dr. Theador Hawthorne, has a visit coming up in February.  Past Medical History:  Diagnosis Date   Arthritis    CAD (coronary artery disease)    a. s/p DES to LAD (2014)  b. s/p LHC (09/2014) with patent LAD stent and stable dz from previous cath    Chronic anemia    CKD (chronic kidney disease), stage IV (HCC)    Claudication (HCC)    R leg with bilateral femoral bruits.   Essential hypertension    Hyperlipidemia    NSTEMI (non-ST elevated myocardial infarction) [facility])     Past Surgical History:  Procedure Laterality Date   Bilateral carpal tunnel release Bilateral    CARDIAC CATHETERIZATION N/A 10/10/2014   Procedure: Left Heart Cath and Coronary Angiography;  Surgeon: Belva Crome, MD;  Location: [facility];  Service: Cardiovascular;  Laterality: N/A;   LEFT HEART CATHETERIZATION WITH CORONARY ANGIOGRAM N/A 02/09/2012   Procedure: LEFT HEART CATHETERIZATION WITH CORONARY ANGIOGRAM;  Surgeon: Peter M Martinique, MD; left main 10%, LAD 80%/95%, D1 patent, CFX 70% at OM 2, dCFX 90%, RCA 100% with L-R collaterals     PERCUTANEOUS CORONARY STENT INTERVENTION (PCI-S) N/A 02/11/2012   Procedure: PERCUTANEOUS CORONARY STENT INTERVENTION (PCI-S);  Surgeon:  Burnell Blanks, MD; 3.0 x 28 mm Promus Premier DES to the mid LAD    REPAIR OF PERFORATED ULCER      Current Outpatient Medications  Medication Sig Dispense Refill   acetaminophen (TYLENOL) 500 MG tablet Take 500 mg by mouth as needed for mild pain or fever.     amLODipine (NORVASC) 10 MG tablet Take 10 mg by mouth daily.     aspirin EC 81 MG tablet Take 81 mg by mouth daily.     atorvastatin (LIPITOR) 80 MG tablet Take 1 tablet (80 mg total) by mouth every evening. 90 tablet 3   calcitRIOL (ROCALTROL) 0.25 MCG capsule Take 0.25 mcg by mouth 3 (three) times a week.     Cholecalciferol (VITAMIN D-3) 1000 UNITS CAPS Take 1,000 Units by mouth daily.      guaiFENesin (MUCINEX) 600 MG 12 hr tablet Take 600 mg by mouth daily.     hydrALAZINE (APRESOLINE) 100 MG tablet Take 0.5 tablets by mouth 3 (three) times daily.     iron polysaccharides (NIFEREX) 150 MG capsule Take 150 mg by mouth daily.     isosorbide mononitrate (IMDUR) 60 MG 24 hr tablet Take 60 mg by mouth daily.     loratadine (CLARITIN) 10 MG tablet Take 10 mg by mouth daily.  2   losartan (COZAAR) 25 MG tablet Take 1/2 tablet by mouth (12.5 total) daily     metoprolol  tartrate (LOPRESSOR) 25 MG tablet TAKE 1/2 TABLET BY MOUTH TWICE DAILY. 90 tablet 3   nitroGLYCERIN (NITROSTAT) 0.4 MG SL tablet Place 1 tablet (0.4 mg total) under the tongue every 5 (five) minutes x 3 doses as needed for chest pain (if no relief after 3rd dose, proceed to the ED for an evaluation or call 911). 75 tablet 3   sodium bicarbonate 650 MG tablet Take 1 tablet (650 mg total) by mouth 2 (two) times daily. 60 tablet 0   No current facility-administered medications for this visit.   Allergies:  Patient has no known allergies.   ROS: Chronic arthritic pains.  No syncope.  Physical Exam: VS:  BP 138/74    Pulse 60    Ht 5\' 6"  (1.676 m)    Wt 210 lb (95.3 kg)    SpO2 95%    BMI 33.89 kg/m , BMI Body mass index is 33.89 kg/m.  Wt Readings from Last 3  Encounters:  02/26/21 210 lb (95.3 kg)  08/21/20 208 lb 6.4 oz (94.5 kg)  02/24/20 202 lb (91.6 kg)    General: Patient appears comfortable at rest. HEENT: Conjunctiva and lids normal, wearing a mask. Neck: Supple, no elevated JVP or carotid bruits, no thyromegaly. Lungs: Clear to auscultation, nonlabored breathing at rest. Cardiac: Regular rate and rhythm, no S3 or significant systolic murmur, no pericardial rub. Extremities: Trace ankle edema, stable.  ECG:  An ECG dated 08/21/2020 was personally reviewed today and demonstrated:  Sinus rhythm with ventricular bigeminy.  Recent Labwork: July 2022: Potassium 4.9, hemoglobin 12.6, platelets 169, BUN 31, creatinine 2.13 January 2021: Hemoglobin 11.7, platelets 154, potassium 4.0, BUN 43, creatinine 2.48  Other Studies Reviewed Today:  Lexiscan Myoview 01/30/2020: There was no ST segment deviation noted during stress. The study is normal. This is a low risk study. The left ventricular ejection fraction is mildly decreased (45-54%).   Normal resting and stress perfusion. No ischemia or infarction EF 52% no RWMA;s some diaphragmatic attenuation at inferior base   Echocardiogram 02/21/2020:  1. Left ventricular ejection fraction, by estimation, is 60 to 65%. The  left ventricle has normal function. The left ventricle has no regional  wall motion abnormalities. There is mild left ventricular hypertrophy.  Left ventricular diastolic parameters  are consistent with Grade I diastolic dysfunction (impaired relaxation).  The average left ventricular global longitudinal strain is -18.0 %. The  global longitudinal strain is normal.   2. Right ventricular systolic function is normal. The right ventricular  size is normal.   3. Left atrial size was severely dilated.   4. Right atrial size was mildly dilated.   5. The mitral valve is normal in structure. Trivial mitral valve  regurgitation. No evidence of mitral stenosis.   6. The aortic valve  is tricuspid. There is mild calcification of the  aortic valve. There is mild thickening of the aortic valve. Aortic valve  regurgitation is not visualized. No aortic stenosis is present.   7. The inferior vena cava is normal in size with greater than 50%  respiratory variability, suggesting right atrial pressure of 3 mmHg.   Assessment and Plan:  1.  Multivessel CAD status post DES to the LAD in 2014, known occlusion of the mid to distal RCA associated with left-to-right collaterals, and severe circumflex disease being managed medically.  Continue aspirin, Norvasc, Lipitor, Imdur, Cozaar, Lopressor, and as needed nitroglycerin.  2.  CKD stage IIIb-IV.  Last creatinine 2.48, requesting her lab work from PCP.  Continue to follow with Dr. Theador Hawthorne.  3.  Mixed hyperlipidemia on Lipitor.  4.  Essential hypertension, systolic is in the 408X today.  No changes made to current regimen.  Medication Adjustments/Labs and Tests Ordered: Current medicines are reviewed at length with the patient today.  Concerns regarding medicines are outlined above.   Tests Ordered: No orders of the defined types were placed in this encounter.   Medication Changes: No orders of the defined types were placed in this encounter.   Disposition:  Follow up  6 months.  Signed, Satira Sark, MD, [facility] 02/26/2021 4:50 PM    [facility] at [place], [place], [place] [address] Phone: [phone]; Fax: [phone]

## 2021-02-26 NOTE — Patient Instructions (Addendum)

## 2021-03-14 DIAGNOSIS — N184 Chronic kidney disease, stage 4 (severe): Secondary | ICD-10-CM | POA: Diagnosis not present

## 2021-03-20 DIAGNOSIS — H401123 Primary open-angle glaucoma, left eye, severe stage: Secondary | ICD-10-CM | POA: Diagnosis not present

## 2021-03-30 DIAGNOSIS — I5032 Chronic diastolic (congestive) heart failure: Secondary | ICD-10-CM | POA: Diagnosis not present

## 2021-03-30 DIAGNOSIS — N189 Chronic kidney disease, unspecified: Secondary | ICD-10-CM | POA: Diagnosis not present

## 2021-03-30 DIAGNOSIS — D631 Anemia in chronic kidney disease: Secondary | ICD-10-CM | POA: Diagnosis not present

## 2021-03-30 DIAGNOSIS — N184 Chronic kidney disease, stage 4 (severe): Secondary | ICD-10-CM | POA: Diagnosis not present

## 2021-03-30 DIAGNOSIS — I129 Hypertensive chronic kidney disease with stage 1 through stage 4 chronic kidney disease, or unspecified chronic kidney disease: Secondary | ICD-10-CM | POA: Diagnosis not present

## 2021-03-30 DIAGNOSIS — E211 Secondary hyperparathyroidism, not elsewhere classified: Secondary | ICD-10-CM | POA: Diagnosis not present

## 2021-03-30 DIAGNOSIS — R809 Proteinuria, unspecified: Secondary | ICD-10-CM | POA: Diagnosis not present

## 2021-04-10 DIAGNOSIS — Z299 Encounter for prophylactic measures, unspecified: Secondary | ICD-10-CM | POA: Diagnosis not present

## 2021-04-10 DIAGNOSIS — Z7189 Other specified counseling: Secondary | ICD-10-CM | POA: Diagnosis not present

## 2021-04-10 DIAGNOSIS — Z0000 Encounter for general adult medical examination without abnormal findings: Secondary | ICD-10-CM | POA: Diagnosis not present

## 2021-04-10 DIAGNOSIS — I509 Heart failure, unspecified: Secondary | ICD-10-CM | POA: Diagnosis not present

## 2021-04-10 DIAGNOSIS — I25119 Atherosclerotic heart disease of native coronary artery with unspecified angina pectoris: Secondary | ICD-10-CM | POA: Diagnosis not present

## 2021-04-10 DIAGNOSIS — I10 Essential (primary) hypertension: Secondary | ICD-10-CM | POA: Diagnosis not present

## 2021-04-16 DIAGNOSIS — M79675 Pain in left toe(s): Secondary | ICD-10-CM | POA: Diagnosis not present

## 2021-04-16 DIAGNOSIS — M79672 Pain in left foot: Secondary | ICD-10-CM | POA: Diagnosis not present

## 2021-04-16 DIAGNOSIS — M79671 Pain in right foot: Secondary | ICD-10-CM | POA: Diagnosis not present

## 2021-04-16 DIAGNOSIS — M79674 Pain in right toe(s): Secondary | ICD-10-CM | POA: Diagnosis not present

## 2021-04-16 DIAGNOSIS — I739 Peripheral vascular disease, unspecified: Secondary | ICD-10-CM | POA: Diagnosis not present

## 2021-05-26 DIAGNOSIS — E875 Hyperkalemia: Secondary | ICD-10-CM | POA: Diagnosis not present

## 2021-05-26 DIAGNOSIS — I10 Essential (primary) hypertension: Secondary | ICD-10-CM | POA: Diagnosis not present

## 2021-05-26 DIAGNOSIS — N1832 Chronic kidney disease, stage 3b: Secondary | ICD-10-CM | POA: Diagnosis not present

## 2021-05-26 DIAGNOSIS — R5383 Other fatigue: Secondary | ICD-10-CM | POA: Diagnosis not present

## 2021-05-27 DIAGNOSIS — N184 Chronic kidney disease, stage 4 (severe): Secondary | ICD-10-CM | POA: Diagnosis not present

## 2021-06-12 DIAGNOSIS — E211 Secondary hyperparathyroidism, not elsewhere classified: Secondary | ICD-10-CM | POA: Diagnosis not present

## 2021-06-12 DIAGNOSIS — R809 Proteinuria, unspecified: Secondary | ICD-10-CM | POA: Diagnosis not present

## 2021-06-12 DIAGNOSIS — I5032 Chronic diastolic (congestive) heart failure: Secondary | ICD-10-CM | POA: Diagnosis not present

## 2021-06-12 DIAGNOSIS — R718 Other abnormality of red blood cells: Secondary | ICD-10-CM | POA: Diagnosis not present

## 2021-06-12 DIAGNOSIS — I129 Hypertensive chronic kidney disease with stage 1 through stage 4 chronic kidney disease, or unspecified chronic kidney disease: Secondary | ICD-10-CM | POA: Diagnosis not present

## 2021-06-12 DIAGNOSIS — N184 Chronic kidney disease, stage 4 (severe): Secondary | ICD-10-CM | POA: Diagnosis not present

## 2021-06-19 DIAGNOSIS — H401123 Primary open-angle glaucoma, left eye, severe stage: Secondary | ICD-10-CM | POA: Diagnosis not present

## 2021-07-02 DIAGNOSIS — M79674 Pain in right toe(s): Secondary | ICD-10-CM | POA: Diagnosis not present

## 2021-07-02 DIAGNOSIS — M79675 Pain in left toe(s): Secondary | ICD-10-CM | POA: Diagnosis not present

## 2021-07-02 DIAGNOSIS — I739 Peripheral vascular disease, unspecified: Secondary | ICD-10-CM | POA: Diagnosis not present

## 2021-07-02 DIAGNOSIS — M79672 Pain in left foot: Secondary | ICD-10-CM | POA: Diagnosis not present

## 2021-07-02 DIAGNOSIS — M79671 Pain in right foot: Secondary | ICD-10-CM | POA: Diagnosis not present

## 2021-07-16 DIAGNOSIS — E7800 Pure hypercholesterolemia, unspecified: Secondary | ICD-10-CM | POA: Diagnosis not present

## 2021-07-16 DIAGNOSIS — Z79899 Other long term (current) drug therapy: Secondary | ICD-10-CM | POA: Diagnosis not present

## 2021-07-16 DIAGNOSIS — Z0000 Encounter for general adult medical examination without abnormal findings: Secondary | ICD-10-CM | POA: Diagnosis not present

## 2021-07-16 DIAGNOSIS — Z299 Encounter for prophylactic measures, unspecified: Secondary | ICD-10-CM | POA: Diagnosis not present

## 2021-07-16 DIAGNOSIS — R5383 Other fatigue: Secondary | ICD-10-CM | POA: Diagnosis not present

## 2021-08-01 IMAGING — MR MR LUMBAR SPINE W/O CM
5 series · 32 of 48 positions shown · non-contrast
Comparison: None.

CLINICAL DATA: Low back pain radiating down right leg

EXAM:
MRI LUMBAR SPINE WITHOUT CONTRAST
TECHNIQUE: Multiplanar, multisequence MR imaging of the lumbar spine was
performed. No intravenous contrast was administered.

[Series 9: T2 · sagittal · 4.0mm · 0.81mm/px · 6 of 13 slices shown (1 of 2)]
[im 1/13]
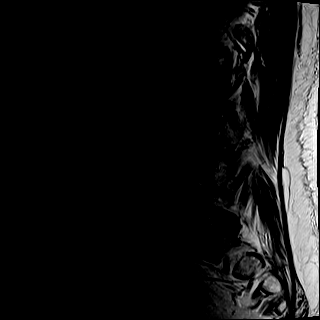
[im 3/13]
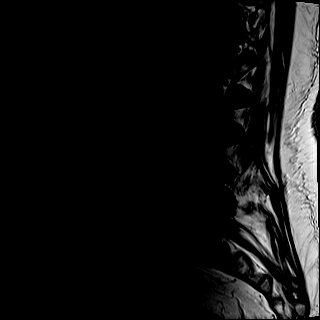
[im 5/13]
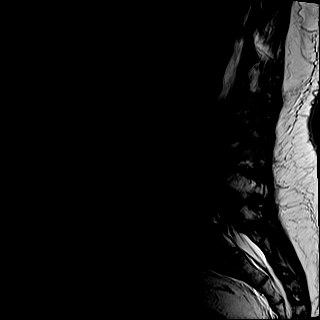
[im 8/13]
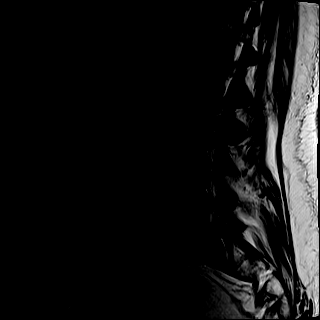
[im 10/13]
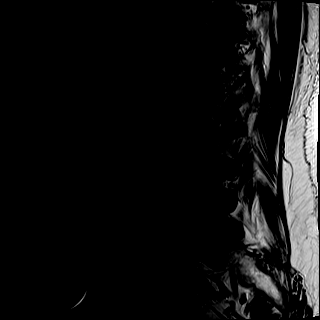
[im 13/13]
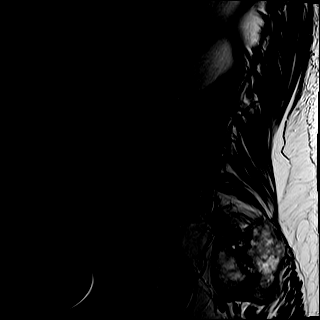

[Series 10: STIR · sagittal · 4.0mm · 0.51mm/px · 2 of 13 slices shown]
[im 1/13]
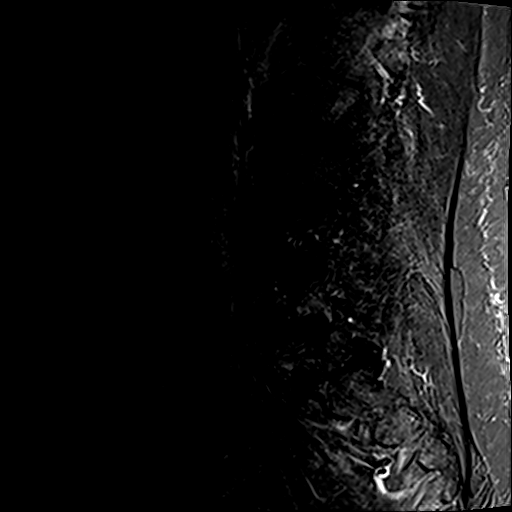
[im 3/13]
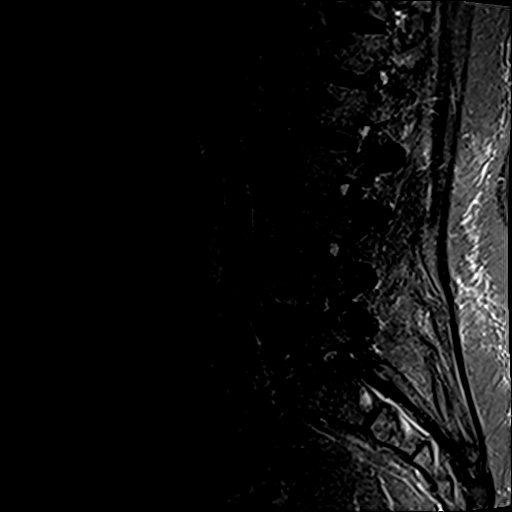

[Series 11: T1 · sagittal · 4.0mm · 1.02mm/px · 6 of 13 slices shown (1 of 2)]
[im 1/13]
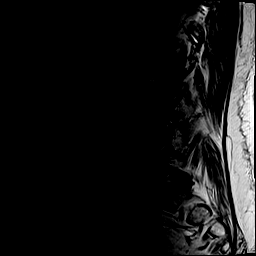
[im 3/13]
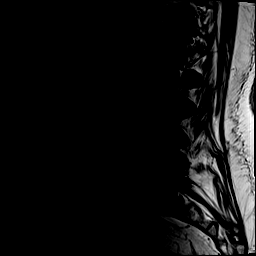
[im 5/13]
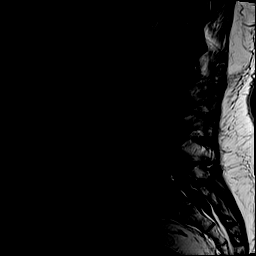
[im 8/13]
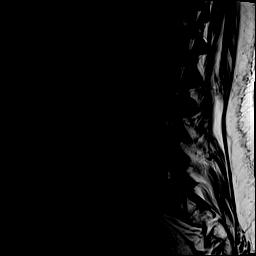
[im 10/13]
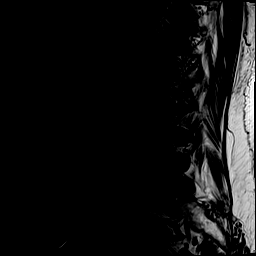
[im 13/13]
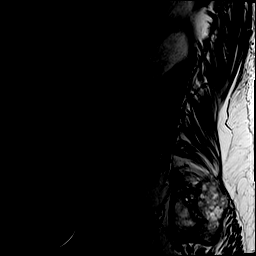

[Series 12: T2 · axial · 4.0mm · 0.70mm/px · z∈[-27,+176]mm · 9 of 32 slices shown (2 of 2)]
[im 1/32]
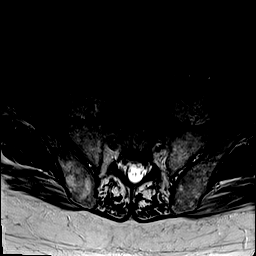
[im 5/32]
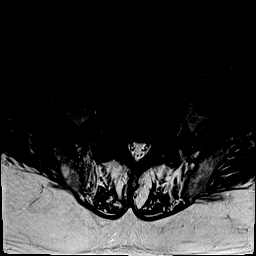
[im 9/32]
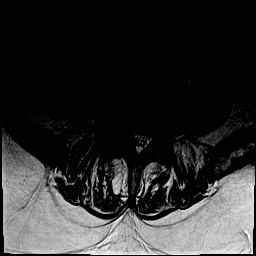
[im 14/32]
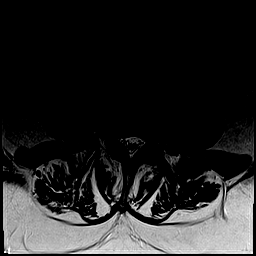
[im 16/32]
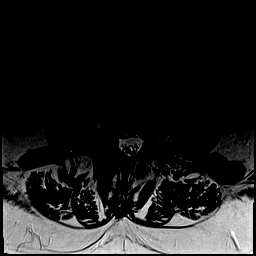
[im 18/32]
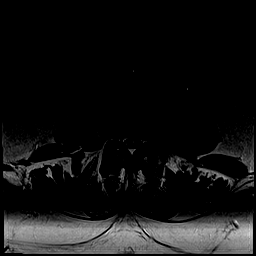
[im 23/32]
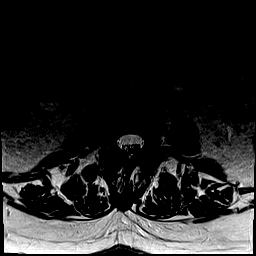
[im 27/32]
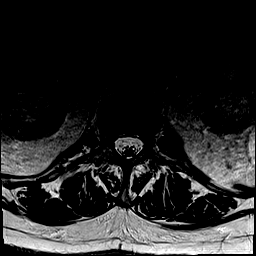
[im 32/32]
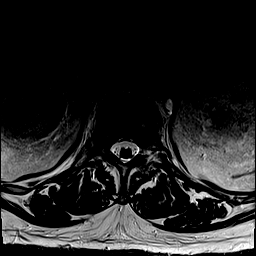

[Series 13: T1 · axial · 4.0mm · 0.35mm/px · z∈[-27,+176]mm · 9 of 32 slices shown (2 of 2)]
[im 1/32]
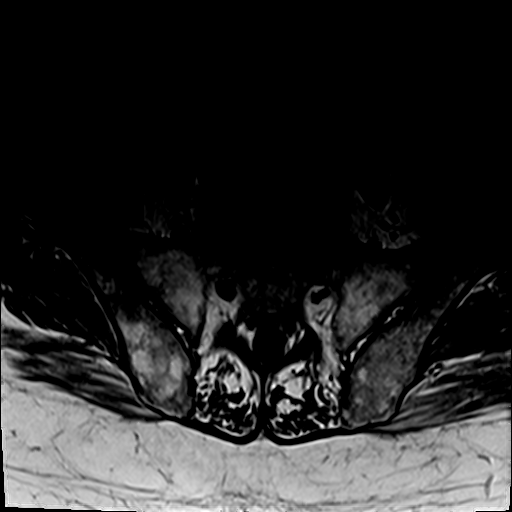
[im 5/32]
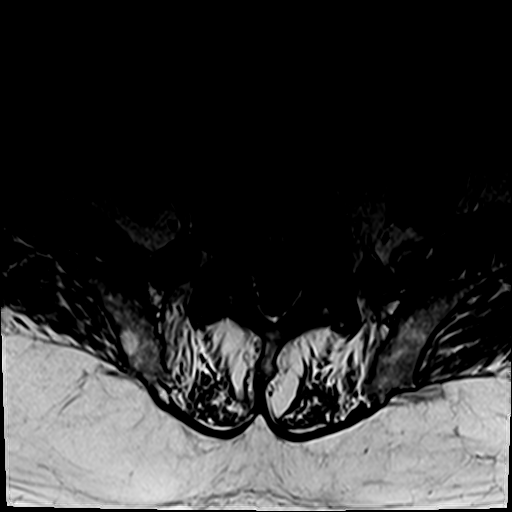
[im 9/32]
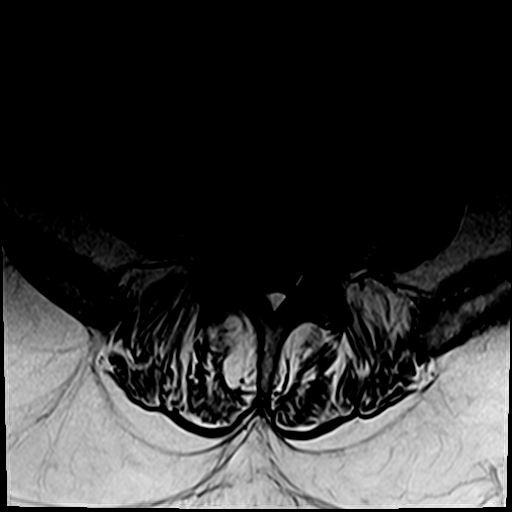
[im 14/32]
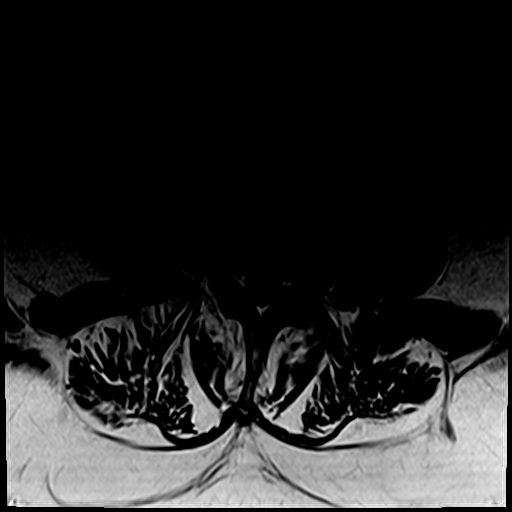
[im 16/32]
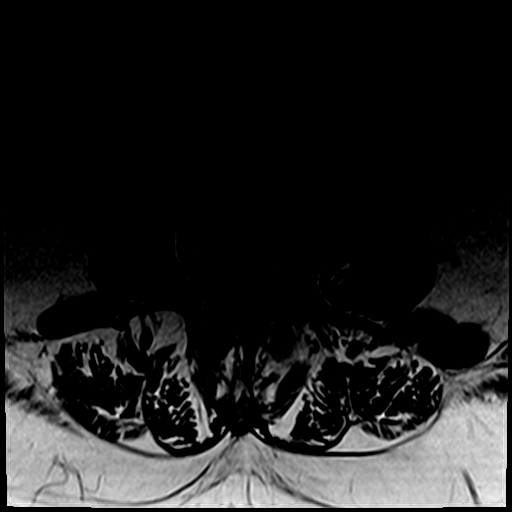
[im 18/32]
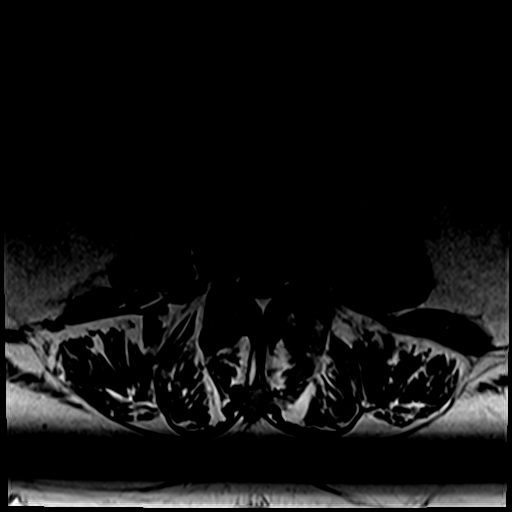
[im 23/32]
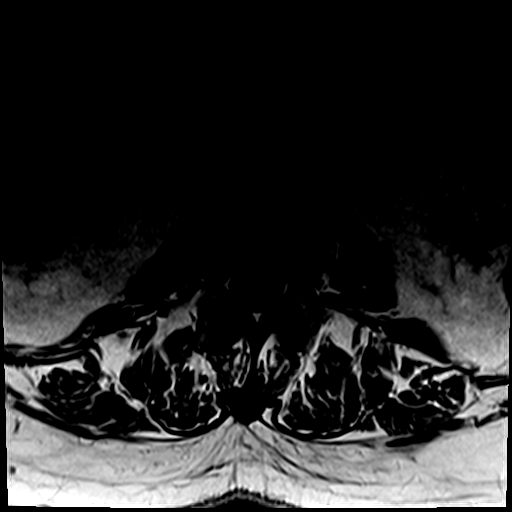
[im 27/32]
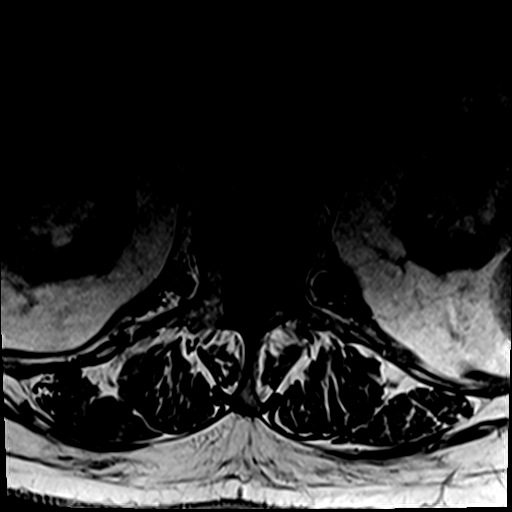
[im 32/32]
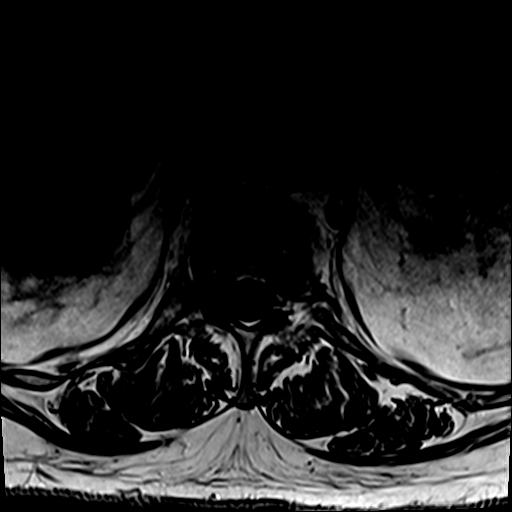

[32 of 48 positions shown; findings below may reference images not displayed]

FINDINGS: Segmentation:  Standard.

Alignment:  Anteroposterior alignment is maintained.

Vertebrae: Multilevel degenerative plate irregularity with Schmorl's
nodes. Vertebral body heights are otherwise maintained. There are
chronic appearing degenerative endplate marrow changes. No
substantial marrow edema. No suspicious osseous lesion.

Conus medullaris and cauda equina: Conus extends to the L2 level.
Conus and cauda equina appear normal.

Paraspinal and other soft tissues: Unremarkable.

Disc levels:

L1-L2: Minimal disc bulge. Facet arthropathy with ligamentum flavum
infolding. No canal or foraminal stenosis.

L2-L3: Disc bulge with endplate osteophytic ridging. Facet
arthropathy with ligamentum flavum infolding. Mild to moderate canal
stenosis. Partial effacement of the right greater than left
subarticular recesses. Mild to moderate foraminal stenosis.

L3-L4: Disc bulge. Facet arthropathy with ligamentum flavum
infolding. Mild canal stenosis. Mild to moderate foraminal stenosis.

L4-L5: Disc bulge with superimposed right foraminal protrusion.
Facet arthropathy with ligamentum flavum infolding. Mild to moderate
canal stenosis. Effacement of the right greater than left
subarticular recesses. Marked right and moderate left foraminal
stenosis.

L5-S1: Disc bulge. Facet arthropathy with ligamentum flavum
infolding. No canal stenosis. Partial effacement of the left greater
than right subarticular recesses. Marked foraminal stenosis.
IMPRESSION: Multilevel degenerative changes as detailed above. Canal and
subarticular recess stenosis are greatest at L2-L3 and L4-L5. Right
foraminal stenosis is greatest at L4-L5 and L5-S1.

## 2021-08-06 DIAGNOSIS — N184 Chronic kidney disease, stage 4 (severe): Secondary | ICD-10-CM | POA: Diagnosis not present

## 2021-08-06 DIAGNOSIS — R9389 Abnormal findings on diagnostic imaging of other specified body structures: Secondary | ICD-10-CM | POA: Diagnosis not present

## 2021-08-08 ENCOUNTER — Other Ambulatory Visit: Payer: Self-pay | Admitting: Cardiology

## 2021-08-14 DIAGNOSIS — D631 Anemia in chronic kidney disease: Secondary | ICD-10-CM | POA: Diagnosis not present

## 2021-08-14 DIAGNOSIS — I129 Hypertensive chronic kidney disease with stage 1 through stage 4 chronic kidney disease, or unspecified chronic kidney disease: Secondary | ICD-10-CM | POA: Diagnosis not present

## 2021-08-14 DIAGNOSIS — R809 Proteinuria, unspecified: Secondary | ICD-10-CM | POA: Diagnosis not present

## 2021-08-14 DIAGNOSIS — E211 Secondary hyperparathyroidism, not elsewhere classified: Secondary | ICD-10-CM | POA: Diagnosis not present

## 2021-08-14 DIAGNOSIS — R718 Other abnormality of red blood cells: Secondary | ICD-10-CM | POA: Diagnosis not present

## 2021-08-14 DIAGNOSIS — N184 Chronic kidney disease, stage 4 (severe): Secondary | ICD-10-CM | POA: Diagnosis not present

## 2021-08-14 DIAGNOSIS — N189 Chronic kidney disease, unspecified: Secondary | ICD-10-CM | POA: Diagnosis not present

## 2021-08-14 DIAGNOSIS — I5032 Chronic diastolic (congestive) heart failure: Secondary | ICD-10-CM | POA: Diagnosis not present

## 2021-09-02 ENCOUNTER — Encounter: Payer: Self-pay | Admitting: Cardiology

## 2021-09-02 ENCOUNTER — Ambulatory Visit (INDEPENDENT_AMBULATORY_CARE_PROVIDER_SITE_OTHER): Payer: Medicare Other | Admitting: Cardiology

## 2021-09-02 VITALS — BP 128/74 | HR 60 | Ht 66.0 in | Wt 211.6 lb

## 2021-09-02 DIAGNOSIS — I10 Essential (primary) hypertension: Secondary | ICD-10-CM

## 2021-09-02 DIAGNOSIS — I25119 Atherosclerotic heart disease of native coronary artery with unspecified angina pectoris: Secondary | ICD-10-CM | POA: Diagnosis not present

## 2021-09-02 DIAGNOSIS — E782 Mixed hyperlipidemia: Secondary | ICD-10-CM

## 2021-09-02 MED ORDER — NITROGLYCERIN 0.4 MG SL SUBL: 0.4000 mg | SUBLINGUAL_TABLET | SUBLINGUAL | 1 refills | Status: AC | PRN

## 2021-09-02 NOTE — Progress Notes (Signed)
Cardiology Office Note  Date: 09/02/2021   ID: Kevin [name], DOB 06-21-1930, MRN [id number]  PCP:  Glenda Chroman, MD  Cardiologist:  Rozann Lesches, MD Electrophysiologist:  None   Chief Complaint  Patient presents with   Cardiac follow-up    History of Present Illness: Kevin [name] is an 88 y.o. male last seen in January.  He is here for a follow-up visit.  He does not describe any angina or nitroglycerin use with current activities.  NYHA class II dyspnea and no significant palpitations.  I reviewed his ECG which shows sinus bradycardia with PAC, diffuse nonspecific ST-T changes.  We went over his cardiac regimen which is stable and outlined below.  He does not report any intolerances.  Also following routine lab work with PCP.  Last creatinine was 2.49 with normal potassium in May.  Past Medical History:  Diagnosis Date   Arthritis    CAD (coronary artery disease)    a. s/p DES to LAD (2014)  b. s/p LHC (09/2014) with patent LAD stent and stable dz from previous cath    Chronic anemia    CKD (chronic kidney disease), stage IV (HCC)    Claudication (HCC)    R leg with bilateral femoral bruits.   Essential hypertension    Hyperlipidemia    NSTEMI (non-ST elevated myocardial infarction) [facility])     Past Surgical History:  Procedure Laterality Date   Bilateral carpal tunnel release Bilateral    CARDIAC CATHETERIZATION N/A 10/10/2014   Procedure: Left Heart Cath and Coronary Angiography;  Surgeon: Belva Crome, MD;  Location: [facility];  Service: Cardiovascular;  Laterality: N/A;   LEFT HEART CATHETERIZATION WITH CORONARY ANGIOGRAM N/A 02/09/2012   Procedure: LEFT HEART CATHETERIZATION WITH CORONARY ANGIOGRAM;  Surgeon: Peter M Martinique, MD; left main 10%, LAD 80%/95%, D1 patent, CFX 70% at OM 2, dCFX 90%, RCA 100% with L-R collaterals     PERCUTANEOUS CORONARY STENT INTERVENTION (PCI-S) N/A 02/11/2012   Procedure: PERCUTANEOUS CORONARY STENT INTERVENTION (PCI-S);   Surgeon: Burnell Blanks, MD; 3.0 x 28 mm Promus Premier DES to the mid LAD    REPAIR OF PERFORATED ULCER      Current Outpatient Medications  Medication Sig Dispense Refill   acetaminophen (TYLENOL) 500 MG tablet Take 500 mg by mouth as needed for mild pain or fever.     amLODipine (NORVASC) 10 MG tablet Take 10 mg by mouth daily.     aspirin EC 81 MG tablet Take 81 mg by mouth daily.     atorvastatin (LIPITOR) 80 MG tablet Take 1 tablet (80 mg total) by mouth every evening. 90 tablet 3   brimonidine (ALPHAGAN) 0.2 % ophthalmic solution INSTILL ONE DROP IN THE LEFT EYE TWICE DAILY     calcitRIOL (ROCALTROL) 0.25 MCG capsule Take 0.25 mcg by mouth 3 (three) times a week.     Cholecalciferol (VITAMIN D-3) 1000 UNITS CAPS Take 1,000 Units by mouth daily.      dorzolamide-timolol (COSOPT) 22.3-6.8 MG/ML ophthalmic solution PLACE ONE DROP INTO THE LEFT EYE TWICE DAILY     guaiFENesin (MUCINEX) 600 MG 12 hr tablet Take 600 mg by mouth daily.     hydrALAZINE (APRESOLINE) 100 MG tablet Take 0.5 tablets by mouth 3 (three) times daily.     iron polysaccharides (NIFEREX) 150 MG capsule Take 150 mg by mouth daily.     isosorbide mononitrate (IMDUR) 60 MG 24 hr tablet TAKE ONE TABLET BY MOUTH DAILY  90 tablet 1   loratadine (CLARITIN) 10 MG tablet Take 10 mg by mouth daily.  2   losartan (COZAAR) 25 MG tablet Take 1/2 tablet by mouth (12.5 total) daily     metoprolol tartrate (LOPRESSOR) 25 MG tablet TAKE 1/2 TABLET BY MOUTH TWICE DAILY. 90 tablet 3   sodium bicarbonate 650 MG tablet Take 1 tablet (650 mg total) by mouth 2 (two) times daily. 60 tablet 0   nitroGLYCERIN (NITROSTAT) 0.4 MG SL tablet Place 1 tablet (0.4 mg total) under the tongue every 5 (five) minutes x 3 doses as needed for chest pain (if no relief after 2 nd dose, proceed to the ED or call 911). 75 tablet 1   No current facility-administered medications for this visit.   Allergies:  Patient has no known allergies.   ROS: No  palpitations or syncope.  Physical Exam: VS:  BP 128/74   Pulse 60   Ht 5\' 6"  (1.676 m)   Wt 211 lb 9.6 oz (96 kg)   SpO2 94%   BMI 34.15 kg/m , BMI Body mass index is 34.15 kg/m.  Wt Readings from Last 3 Encounters:  09/02/21 211 lb 9.6 oz (96 kg)  02/26/21 210 lb (95.3 kg)  08/21/20 208 lb 6.4 oz (94.5 kg)    General: Patient appears comfortable at rest. HEENT: Conjunctiva and lids normal, oropharynx clear. Neck: Supple, no elevated JVP or carotid bruits, no thyromegaly. Lungs: Clear to auscultation, nonlabored breathing at rest. Cardiac: Regular rate and rhythm, no S3 or significant systolic murmur, no pericardial rub. Extremities: Trace ankle edema.  ECG:  An ECG dated 08/21/2020 was personally reviewed today and demonstrated:  Sinus rhythm with ventricular bigeminy.  Recent Labwork:  06/15/2021: TSH 2.47, hemoglobin 12.1, platelets 173, potassium 4.5, BUN 62, creatinine 2.49  Other Studies Reviewed Today:  Lexiscan Myoview 01/30/2020: There was no ST segment deviation noted during stress. The study is normal. This is a low risk study. The left ventricular ejection fraction is mildly decreased (45-54%).   Normal resting and stress perfusion. No ischemia or infarction EF 52% no RWMA;s some diaphragmatic attenuation at inferior base   Echocardiogram 02/21/2020:  1. Left ventricular ejection fraction, by estimation, is 60 to 65%. The  left ventricle has normal function. The left ventricle has no regional  wall motion abnormalities. There is mild left ventricular hypertrophy.  Left ventricular diastolic parameters  are consistent with Grade I diastolic dysfunction (impaired relaxation).  The average left ventricular global longitudinal strain is -18.0 %. The  global longitudinal strain is normal.   2. Right ventricular systolic function is normal. The right ventricular  size is normal.   3. Left atrial size was severely dilated.   4. Right atrial size was mildly  dilated.   5. The mitral valve is normal in structure. Trivial mitral valve  regurgitation. No evidence of mitral stenosis.   6. The aortic valve is tricuspid. There is mild calcification of the  aortic valve. There is mild thickening of the aortic valve. Aortic valve  regurgitation is not visualized. No aortic stenosis is present.   7. The inferior vena cava is normal in size with greater than 50%  respiratory variability, suggesting right atrial pressure of 3 mmHg.   Assessment and Plan:  1.  Multivessel CAD status post DES to the LAD in 2014 with occluded mid to distal RCA associated with left-to-right collaterals and medically managed circumflex disease.  He is clinically stable without progressive angina.  Refill provided for  fresh bottle of nitroglycerin.  Continue aspirin, Norvasc, Lipitor, Cozaar, Lopressor, and Imdur.  2.  CKD stage IIIb-IV with recent creatinine 2.49.  3.  Mixed hyperlipidemia on Lipitor.  Continue to follow lab work with PCP.  4.  Essential hypertension, blood pressure is well-controlled today.  Continue current therapy.  Medication Adjustments/Labs and Tests Ordered: Current medicines are reviewed at length with the patient today.  Concerns regarding medicines are outlined above.   Tests Ordered: Orders Placed This Encounter  Procedures   EKG 12-Lead    Medication Changes: Meds ordered this encounter  Medications   nitroGLYCERIN (NITROSTAT) 0.4 MG SL tablet    Sig: Place 1 tablet (0.4 mg total) under the tongue every 5 (five) minutes x 3 doses as needed for chest pain (if no relief after 2 nd dose, proceed to the ED or call 911).    Dispense:  75 tablet    Refill:  1    Disposition:  Follow up  6 months.  Signed, Satira Sark, MD, [facility] 09/02/2021 4:38 PM    [facility] at [place], [place], [place] [address] Phone: [phone]; Fax: [phone]

## 2021-09-02 NOTE — Patient Instructions (Addendum)

## 2021-09-06 DIAGNOSIS — H401123 Primary open-angle glaucoma, left eye, severe stage: Secondary | ICD-10-CM | POA: Diagnosis not present

## 2021-09-06 DIAGNOSIS — H40011 Open angle with borderline findings, low risk, right eye: Secondary | ICD-10-CM | POA: Diagnosis not present

## 2021-09-17 DIAGNOSIS — I739 Peripheral vascular disease, unspecified: Secondary | ICD-10-CM | POA: Diagnosis not present

## 2021-09-17 DIAGNOSIS — M79674 Pain in right toe(s): Secondary | ICD-10-CM | POA: Diagnosis not present

## 2021-09-17 DIAGNOSIS — M79672 Pain in left foot: Secondary | ICD-10-CM | POA: Diagnosis not present

## 2021-09-17 DIAGNOSIS — M79671 Pain in right foot: Secondary | ICD-10-CM | POA: Diagnosis not present

## 2021-09-17 DIAGNOSIS — M79675 Pain in left toe(s): Secondary | ICD-10-CM | POA: Diagnosis not present

## 2021-10-07 DIAGNOSIS — I700 Atherosclerosis of aorta: Secondary | ICD-10-CM | POA: Diagnosis not present

## 2021-10-07 DIAGNOSIS — Z299 Encounter for prophylactic measures, unspecified: Secondary | ICD-10-CM | POA: Diagnosis not present

## 2021-10-07 DIAGNOSIS — R059 Cough, unspecified: Secondary | ICD-10-CM | POA: Diagnosis not present

## 2021-10-07 DIAGNOSIS — I10 Essential (primary) hypertension: Secondary | ICD-10-CM | POA: Diagnosis not present

## 2021-10-07 DIAGNOSIS — J069 Acute upper respiratory infection, unspecified: Secondary | ICD-10-CM | POA: Diagnosis not present

## 2021-10-09 DIAGNOSIS — N184 Chronic kidney disease, stage 4 (severe): Secondary | ICD-10-CM | POA: Diagnosis not present

## 2021-10-16 DIAGNOSIS — I9589 Other hypotension: Secondary | ICD-10-CM | POA: Diagnosis not present

## 2021-10-16 DIAGNOSIS — I5032 Chronic diastolic (congestive) heart failure: Secondary | ICD-10-CM | POA: Diagnosis not present

## 2021-10-16 DIAGNOSIS — R718 Other abnormality of red blood cells: Secondary | ICD-10-CM | POA: Diagnosis not present

## 2021-10-16 DIAGNOSIS — Z8679 Personal history of other diseases of the circulatory system: Secondary | ICD-10-CM | POA: Diagnosis not present

## 2021-10-16 DIAGNOSIS — N184 Chronic kidney disease, stage 4 (severe): Secondary | ICD-10-CM | POA: Diagnosis not present

## 2021-10-16 DIAGNOSIS — E211 Secondary hyperparathyroidism, not elsewhere classified: Secondary | ICD-10-CM | POA: Diagnosis not present

## 2021-10-16 DIAGNOSIS — R809 Proteinuria, unspecified: Secondary | ICD-10-CM | POA: Diagnosis not present

## 2021-11-19 ENCOUNTER — Other Ambulatory Visit: Payer: Self-pay | Admitting: Cardiology

## 2021-11-20 DIAGNOSIS — I10 Essential (primary) hypertension: Secondary | ICD-10-CM | POA: Diagnosis not present

## 2021-11-20 DIAGNOSIS — Z713 Dietary counseling and surveillance: Secondary | ICD-10-CM | POA: Diagnosis not present

## 2021-11-20 DIAGNOSIS — D649 Anemia, unspecified: Secondary | ICD-10-CM | POA: Diagnosis not present

## 2021-11-20 DIAGNOSIS — N184 Chronic kidney disease, stage 4 (severe): Secondary | ICD-10-CM | POA: Diagnosis not present

## 2021-11-20 DIAGNOSIS — Z299 Encounter for prophylactic measures, unspecified: Secondary | ICD-10-CM | POA: Diagnosis not present

## 2021-12-03 DIAGNOSIS — M79675 Pain in left toe(s): Secondary | ICD-10-CM | POA: Diagnosis not present

## 2021-12-03 DIAGNOSIS — I739 Peripheral vascular disease, unspecified: Secondary | ICD-10-CM | POA: Diagnosis not present

## 2021-12-03 DIAGNOSIS — M79674 Pain in right toe(s): Secondary | ICD-10-CM | POA: Diagnosis not present

## 2021-12-03 DIAGNOSIS — M79671 Pain in right foot: Secondary | ICD-10-CM | POA: Diagnosis not present

## 2021-12-03 DIAGNOSIS — M79672 Pain in left foot: Secondary | ICD-10-CM | POA: Diagnosis not present

## 2021-12-27 ENCOUNTER — Other Ambulatory Visit: Payer: Self-pay | Admitting: Cardiology

## 2022-01-06 DIAGNOSIS — N184 Chronic kidney disease, stage 4 (severe): Secondary | ICD-10-CM | POA: Diagnosis not present

## 2022-01-08 DIAGNOSIS — H40011 Open angle with borderline findings, low risk, right eye: Secondary | ICD-10-CM | POA: Diagnosis not present

## 2022-01-08 DIAGNOSIS — H401123 Primary open-angle glaucoma, left eye, severe stage: Secondary | ICD-10-CM | POA: Diagnosis not present

## 2022-02-10 DIAGNOSIS — R809 Proteinuria, unspecified: Secondary | ICD-10-CM | POA: Diagnosis not present

## 2022-02-10 DIAGNOSIS — N184 Chronic kidney disease, stage 4 (severe): Secondary | ICD-10-CM | POA: Diagnosis not present

## 2022-02-10 DIAGNOSIS — E211 Secondary hyperparathyroidism, not elsewhere classified: Secondary | ICD-10-CM | POA: Diagnosis not present

## 2022-02-10 DIAGNOSIS — I5032 Chronic diastolic (congestive) heart failure: Secondary | ICD-10-CM | POA: Diagnosis not present

## 2022-02-10 DIAGNOSIS — I129 Hypertensive chronic kidney disease with stage 1 through stage 4 chronic kidney disease, or unspecified chronic kidney disease: Secondary | ICD-10-CM | POA: Diagnosis not present

## 2022-02-18 DIAGNOSIS — I739 Peripheral vascular disease, unspecified: Secondary | ICD-10-CM | POA: Diagnosis not present

## 2022-02-18 DIAGNOSIS — M79672 Pain in left foot: Secondary | ICD-10-CM | POA: Diagnosis not present

## 2022-02-18 DIAGNOSIS — M79671 Pain in right foot: Secondary | ICD-10-CM | POA: Diagnosis not present

## 2022-02-18 DIAGNOSIS — M79674 Pain in right toe(s): Secondary | ICD-10-CM | POA: Diagnosis not present

## 2022-02-18 DIAGNOSIS — M79675 Pain in left toe(s): Secondary | ICD-10-CM | POA: Diagnosis not present

## 2022-02-24 DIAGNOSIS — I25119 Atherosclerotic heart disease of native coronary artery with unspecified angina pectoris: Secondary | ICD-10-CM | POA: Diagnosis not present

## 2022-02-24 DIAGNOSIS — D649 Anemia, unspecified: Secondary | ICD-10-CM | POA: Diagnosis not present

## 2022-02-24 DIAGNOSIS — I700 Atherosclerosis of aorta: Secondary | ICD-10-CM | POA: Diagnosis not present

## 2022-02-24 DIAGNOSIS — Z299 Encounter for prophylactic measures, unspecified: Secondary | ICD-10-CM | POA: Diagnosis not present

## 2022-02-24 DIAGNOSIS — I10 Essential (primary) hypertension: Secondary | ICD-10-CM | POA: Diagnosis not present

## 2022-03-05 ENCOUNTER — Ambulatory Visit: Payer: Medicare Other | Attending: Cardiology | Admitting: Cardiology

## 2022-03-05 ENCOUNTER — Encounter: Payer: Self-pay | Admitting: Cardiology

## 2022-03-05 VITALS — BP 126/58 | HR 56 | Ht 66.0 in | Wt 208.0 lb

## 2022-03-05 DIAGNOSIS — N184 Chronic kidney disease, stage 4 (severe): Secondary | ICD-10-CM

## 2022-03-05 DIAGNOSIS — I25119 Atherosclerotic heart disease of native coronary artery with unspecified angina pectoris: Secondary | ICD-10-CM | POA: Diagnosis not present

## 2022-03-05 DIAGNOSIS — E782 Mixed hyperlipidemia: Secondary | ICD-10-CM

## 2022-03-05 DIAGNOSIS — I10 Essential (primary) hypertension: Secondary | ICD-10-CM | POA: Diagnosis not present

## 2022-03-05 NOTE — Patient Instructions (Addendum)

## 2022-03-05 NOTE — Progress Notes (Signed)
Cardiology Office Note  Date: 03/05/2022   ID: Kevin [name], DOB 1930-04-09, MRN [id number]  PCP:  Glenda Chroman, MD  Cardiologist:  Rozann Lesches, MD Electrophysiologist:  None   Chief Complaint  Patient presents with   Cardiac follow-up    History of Present Illness: Kevin [name] is an 89 y.o. male last seen in August 2023.  He is here for a routine visit.  He reports no angina or nitroglycerin use at current level of activity, stable NYHA class II dyspnea.  He does not report any recent falls or syncope.  I reviewed his medications.  He reports compliance with therapy.  Still following with nephrology, I did review his most recent lab work which is noted below.  His LDL has been well-controlled at 53 on Lipitor most recently.  Blood pressure is also well-controlled today.  Past Medical History:  Diagnosis Date   Arthritis    CAD (coronary artery disease)    a. s/p DES to LAD (2014)  b. s/p LHC (09/2014) with patent LAD stent and stable dz from previous cath    Chronic anemia    CKD (chronic kidney disease), stage IV (HCC)    Claudication (HCC)    R leg with bilateral femoral bruits.   Essential hypertension    Hyperlipidemia    NSTEMI (non-ST elevated myocardial infarction) [facility])     Current Outpatient Medications  Medication Sig Dispense Refill   acetaminophen (TYLENOL) 500 MG tablet Take 500 mg by mouth as needed for mild pain or fever.     amLODipine (NORVASC) 10 MG tablet Take 10 mg by mouth daily.     aspirin EC 81 MG tablet Take 81 mg by mouth daily.     atorvastatin (LIPITOR) 80 MG tablet Take 1 tablet (80 mg total) by mouth every evening. 90 tablet 3   brimonidine (ALPHAGAN) 0.2 % ophthalmic solution INSTILL ONE DROP IN THE LEFT EYE TWICE DAILY     calcitRIOL (ROCALTROL) 0.25 MCG capsule Take 0.25 mcg by mouth 3 (three) times a week.     Cholecalciferol (VITAMIN D-3) 1000 UNITS CAPS Take 1,000 Units by mouth daily.      dorzolamide-timolol (COSOPT) 22.3-6.8  MG/ML ophthalmic solution PLACE ONE DROP INTO THE LEFT EYE TWICE DAILY     furosemide (LASIX) 20 MG tablet Take 20 mg by mouth 2 (two) times daily.     guaiFENesin (MUCINEX) 600 MG 12 hr tablet Take 600 mg by mouth daily.     hydrALAZINE (APRESOLINE) 100 MG tablet Take 0.5 tablets by mouth 3 (three) times daily.     iron polysaccharides (NIFEREX) 150 MG capsule Take 150 mg by mouth daily.     isosorbide mononitrate (IMDUR) 60 MG 24 hr tablet TAKE ONE TABLET BY MOUTH DAILY 90 tablet 1   latanoprost (XALATAN) 0.005 % ophthalmic solution Place 1 drop into both eyes at bedtime.     loratadine (CLARITIN) 10 MG tablet Take 10 mg by mouth daily.  2   losartan (COZAAR) 25 MG tablet Take 1/2 tablet by mouth (12.5 total) daily     metoprolol tartrate (LOPRESSOR) 25 MG tablet TAKE 1/2 TABLET BY MOUTH TWICE DAILY. 90 tablet 3   nitroGLYCERIN (NITROSTAT) 0.4 MG SL tablet Place 1 tablet (0.4 mg total) under the tongue every 5 (five) minutes x 3 doses as needed for chest pain (if no relief after 2 nd dose, proceed to the ED or call 911). 75 tablet 1   sodium bicarbonate  650 MG tablet Take 1 tablet (650 mg total) by mouth 2 (two) times daily. 60 tablet 0   No current facility-administered medications for this visit.   Allergies:  Patient has no known allergies.   ROS: No orthopnea or PND.  Physical Exam: VS:  BP (!) 126/58   Pulse (!) 56   Ht 5\' 6"  (1.676 m)   Wt 208 lb (94.3 kg)   SpO2 96%   BMI 33.57 kg/m , BMI Body mass index is 33.57 kg/m.  Wt Readings from Last 3 Encounters:  03/05/22 208 lb (94.3 kg)  09/02/21 211 lb 9.6 oz (96 kg)  02/26/21 210 lb (95.3 kg)    General: Patient appears comfortable at rest. HEENT: Conjunctiva and lids normal. Neck: Supple, no elevated JVP or carotid bruits. Lungs: Clear to auscultation, nonlabored breathing at rest. Cardiac: Regular rate and rhythm, no S3, 1/6 systolic murmur. Extremities: No pitting edema.  ECG:  An ECG dated 09/02/2021 was personally  reviewed today and demonstrated:  Sinus bradycardia with PAC, diffuse nonspecific ST-T changes.  Recent Labwork:  June 2023: Cholesterol 113, triglycerides 58, HDL 47, LDL 53 December 2023: Potassium 4.1, BUN 50, creatinine 2.71 January 2024: Hemoglobin 12.0, platelets 187  Other Studies Reviewed Today:  Lexiscan Myoview 01/30/2020: There was no ST segment deviation noted during stress. The study is normal. This is a low risk study. The left ventricular ejection fraction is mildly decreased (45-54%).   Normal resting and stress perfusion. No ischemia or infarction EF 52% no RWMA;s some diaphragmatic attenuation at inferior base   Echocardiogram 02/21/2020:  1. Left ventricular ejection fraction, by estimation, is 60 to 65%. The  left ventricle has normal function. The left ventricle has no regional  wall motion abnormalities. There is mild left ventricular hypertrophy.  Left ventricular diastolic parameters  are consistent with Grade I diastolic dysfunction (impaired relaxation).  The average left ventricular global longitudinal strain is -18.0 %. The  global longitudinal strain is normal.   2. Right ventricular systolic function is normal. The right ventricular  size is normal.   3. Left atrial size was severely dilated.   4. Right atrial size was mildly dilated.   5. The mitral valve is normal in structure. Trivial mitral valve  regurgitation. No evidence of mitral stenosis.   6. The aortic valve is tricuspid. There is mild calcification of the  aortic valve. There is mild thickening of the aortic valve. Aortic valve  regurgitation is not visualized. No aortic stenosis is present.   7. The inferior vena cava is normal in size with greater than 50%  respiratory variability, suggesting right atrial pressure of 3 mmHg.   Assessment and Plan:  1.  Multivessel CAD status post DES to the LAD in 2014 with known occlusion of the mid to distal RCA associated with left-to-right  collaterals and medically managed circumflex stenosis.  We have continued with observation in the absence of progressive angina on current medical regimen.  Last ischemic testing was in 2022.  Continue aspirin, Norvasc, Lipitor, hydralazine, Imdur, Cozaar, Lopressor, and as needed nitroglycerin.  2.  Mixed hyperlipidemia, doing well on Lipitor with most recent LDL 53.  3.  Essential hypertension, blood pressure control is reasonable today, no changes made to current regimen.  4.  CKD stage IV, creatinine 2.71 with follow-up by nephrology.  Medication Adjustments/Labs and Tests Ordered: Current medicines are reviewed at length with the patient today.  Concerns regarding medicines are outlined above.   Tests Ordered: No orders  of the defined types were placed in this encounter.   Medication Changes: No orders of the defined types were placed in this encounter.   Disposition:  Follow up  6 months.  Signed, Satira Sark, MD, [facility] 03/05/2022 4:46 PM    [place] [facility] [place], [place], [place] [address] Phone: [phone]; Fax: [phone]

## 2022-04-04 DIAGNOSIS — I129 Hypertensive chronic kidney disease with stage 1 through stage 4 chronic kidney disease, or unspecified chronic kidney disease: Secondary | ICD-10-CM | POA: Diagnosis not present

## 2022-04-04 DIAGNOSIS — N184 Chronic kidney disease, stage 4 (severe): Secondary | ICD-10-CM | POA: Diagnosis not present

## 2022-04-04 DIAGNOSIS — I5032 Chronic diastolic (congestive) heart failure: Secondary | ICD-10-CM | POA: Diagnosis not present

## 2022-04-04 DIAGNOSIS — R809 Proteinuria, unspecified: Secondary | ICD-10-CM | POA: Diagnosis not present

## 2022-04-04 DIAGNOSIS — E211 Secondary hyperparathyroidism, not elsewhere classified: Secondary | ICD-10-CM | POA: Diagnosis not present

## 2022-04-14 DIAGNOSIS — E7800 Pure hypercholesterolemia, unspecified: Secondary | ICD-10-CM | POA: Diagnosis not present

## 2022-04-14 DIAGNOSIS — I10 Essential (primary) hypertension: Secondary | ICD-10-CM | POA: Diagnosis not present

## 2022-04-14 DIAGNOSIS — Z299 Encounter for prophylactic measures, unspecified: Secondary | ICD-10-CM | POA: Diagnosis not present

## 2022-04-14 DIAGNOSIS — Z0000 Encounter for general adult medical examination without abnormal findings: Secondary | ICD-10-CM | POA: Diagnosis not present

## 2022-04-14 DIAGNOSIS — I509 Heart failure, unspecified: Secondary | ICD-10-CM | POA: Diagnosis not present

## 2022-04-14 DIAGNOSIS — Z7189 Other specified counseling: Secondary | ICD-10-CM | POA: Diagnosis not present

## 2022-04-14 DIAGNOSIS — R5383 Other fatigue: Secondary | ICD-10-CM | POA: Diagnosis not present

## 2022-04-14 DIAGNOSIS — I25119 Atherosclerotic heart disease of native coronary artery with unspecified angina pectoris: Secondary | ICD-10-CM | POA: Diagnosis not present

## 2022-04-14 DIAGNOSIS — Z79899 Other long term (current) drug therapy: Secondary | ICD-10-CM | POA: Diagnosis not present

## 2022-04-14 DIAGNOSIS — I714 Abdominal aortic aneurysm, without rupture, unspecified: Secondary | ICD-10-CM | POA: Diagnosis not present

## 2022-04-15 DIAGNOSIS — I5032 Chronic diastolic (congestive) heart failure: Secondary | ICD-10-CM | POA: Diagnosis not present

## 2022-04-15 DIAGNOSIS — R809 Proteinuria, unspecified: Secondary | ICD-10-CM | POA: Diagnosis not present

## 2022-04-15 DIAGNOSIS — I129 Hypertensive chronic kidney disease with stage 1 through stage 4 chronic kidney disease, or unspecified chronic kidney disease: Secondary | ICD-10-CM | POA: Diagnosis not present

## 2022-04-15 DIAGNOSIS — E211 Secondary hyperparathyroidism, not elsewhere classified: Secondary | ICD-10-CM | POA: Diagnosis not present

## 2022-04-15 DIAGNOSIS — N184 Chronic kidney disease, stage 4 (severe): Secondary | ICD-10-CM | POA: Diagnosis not present

## 2022-04-29 DIAGNOSIS — M79671 Pain in right foot: Secondary | ICD-10-CM | POA: Diagnosis not present

## 2022-04-29 DIAGNOSIS — M79672 Pain in left foot: Secondary | ICD-10-CM | POA: Diagnosis not present

## 2022-04-29 DIAGNOSIS — M79674 Pain in right toe(s): Secondary | ICD-10-CM | POA: Diagnosis not present

## 2022-04-29 DIAGNOSIS — I739 Peripheral vascular disease, unspecified: Secondary | ICD-10-CM | POA: Diagnosis not present

## 2022-04-29 DIAGNOSIS — M79675 Pain in left toe(s): Secondary | ICD-10-CM | POA: Diagnosis not present

## 2022-05-06 DIAGNOSIS — H401123 Primary open-angle glaucoma, left eye, severe stage: Secondary | ICD-10-CM | POA: Diagnosis not present

## 2022-05-20 DIAGNOSIS — N184 Chronic kidney disease, stage 4 (severe): Secondary | ICD-10-CM | POA: Diagnosis not present

## 2022-05-22 ENCOUNTER — Other Ambulatory Visit: Payer: Self-pay | Admitting: *Deleted

## 2022-05-22 MED ORDER — ISOSORBIDE MONONITRATE ER 60 MG PO TB24: 60.0000 mg | ORAL_TABLET | Freq: Every day | ORAL | 3 refills | Status: DC

## 2022-06-16 DIAGNOSIS — R809 Proteinuria, unspecified: Secondary | ICD-10-CM | POA: Diagnosis not present

## 2022-06-16 DIAGNOSIS — I129 Hypertensive chronic kidney disease with stage 1 through stage 4 chronic kidney disease, or unspecified chronic kidney disease: Secondary | ICD-10-CM | POA: Diagnosis not present

## 2022-06-16 DIAGNOSIS — N184 Chronic kidney disease, stage 4 (severe): Secondary | ICD-10-CM | POA: Diagnosis not present

## 2022-06-25 DIAGNOSIS — I129 Hypertensive chronic kidney disease with stage 1 through stage 4 chronic kidney disease, or unspecified chronic kidney disease: Secondary | ICD-10-CM | POA: Diagnosis not present

## 2022-06-25 DIAGNOSIS — N184 Chronic kidney disease, stage 4 (severe): Secondary | ICD-10-CM | POA: Diagnosis not present

## 2022-06-25 DIAGNOSIS — N2581 Secondary hyperparathyroidism of renal origin: Secondary | ICD-10-CM | POA: Diagnosis not present

## 2022-06-25 DIAGNOSIS — I5032 Chronic diastolic (congestive) heart failure: Secondary | ICD-10-CM | POA: Diagnosis not present

## 2022-07-15 DIAGNOSIS — M79672 Pain in left foot: Secondary | ICD-10-CM | POA: Diagnosis not present

## 2022-07-15 DIAGNOSIS — M79675 Pain in left toe(s): Secondary | ICD-10-CM | POA: Diagnosis not present

## 2022-07-15 DIAGNOSIS — M79671 Pain in right foot: Secondary | ICD-10-CM | POA: Diagnosis not present

## 2022-07-15 DIAGNOSIS — M79674 Pain in right toe(s): Secondary | ICD-10-CM | POA: Diagnosis not present

## 2022-07-15 DIAGNOSIS — I739 Peripheral vascular disease, unspecified: Secondary | ICD-10-CM | POA: Diagnosis not present

## 2022-08-05 DIAGNOSIS — H401123 Primary open-angle glaucoma, left eye, severe stage: Secondary | ICD-10-CM | POA: Diagnosis not present

## 2022-08-08 ENCOUNTER — Telehealth: Payer: Self-pay | Admitting: Cardiology

## 2022-08-08 MED ORDER — ISOSORBIDE MONONITRATE ER 60 MG PO TB24: 60.0000 mg | ORAL_TABLET | Freq: Every day | ORAL | 3 refills | Status: DC

## 2022-08-08 NOTE — Telephone Encounter (Signed)
Medication refill sent to [facility]

## 2022-08-08 NOTE — Telephone Encounter (Signed)
Patient is going on vacation and needs refills on his Isosorbide Mononit ER 60 mg sent into [facility] here in [place] . [facility] is closed.

## 2022-08-12 DIAGNOSIS — H401123 Primary open-angle glaucoma, left eye, severe stage: Secondary | ICD-10-CM | POA: Diagnosis not present

## 2022-08-21 DIAGNOSIS — I509 Heart failure, unspecified: Secondary | ICD-10-CM | POA: Diagnosis not present

## 2022-08-21 DIAGNOSIS — I700 Atherosclerosis of aorta: Secondary | ICD-10-CM | POA: Diagnosis not present

## 2022-08-21 DIAGNOSIS — Z0000 Encounter for general adult medical examination without abnormal findings: Secondary | ICD-10-CM | POA: Diagnosis not present

## 2022-08-21 DIAGNOSIS — Z87891 Personal history of nicotine dependence: Secondary | ICD-10-CM | POA: Diagnosis not present

## 2022-08-21 DIAGNOSIS — Z299 Encounter for prophylactic measures, unspecified: Secondary | ICD-10-CM | POA: Diagnosis not present

## 2022-08-21 DIAGNOSIS — I714 Abdominal aortic aneurysm, without rupture, unspecified: Secondary | ICD-10-CM | POA: Diagnosis not present

## 2022-08-21 DIAGNOSIS — I10 Essential (primary) hypertension: Secondary | ICD-10-CM | POA: Diagnosis not present

## 2022-08-25 DIAGNOSIS — N2581 Secondary hyperparathyroidism of renal origin: Secondary | ICD-10-CM | POA: Diagnosis not present

## 2022-08-25 DIAGNOSIS — I5032 Chronic diastolic (congestive) heart failure: Secondary | ICD-10-CM | POA: Diagnosis not present

## 2022-08-25 DIAGNOSIS — R809 Proteinuria, unspecified: Secondary | ICD-10-CM | POA: Diagnosis not present

## 2022-08-25 DIAGNOSIS — N184 Chronic kidney disease, stage 4 (severe): Secondary | ICD-10-CM | POA: Diagnosis not present

## 2022-09-01 ENCOUNTER — Ambulatory Visit: Payer: Medicare Other | Attending: Cardiology | Admitting: Cardiology

## 2022-09-01 ENCOUNTER — Encounter: Payer: Self-pay | Admitting: Cardiology

## 2022-09-01 VITALS — BP 132/76 | HR 54 | Wt 221.8 lb

## 2022-09-01 DIAGNOSIS — E782 Mixed hyperlipidemia: Secondary | ICD-10-CM | POA: Diagnosis not present

## 2022-09-01 DIAGNOSIS — N184 Chronic kidney disease, stage 4 (severe): Secondary | ICD-10-CM

## 2022-09-01 DIAGNOSIS — I10 Essential (primary) hypertension: Secondary | ICD-10-CM | POA: Diagnosis not present

## 2022-09-01 DIAGNOSIS — I25119 Atherosclerotic heart disease of native coronary artery with unspecified angina pectoris: Secondary | ICD-10-CM | POA: Diagnosis not present

## 2022-09-01 NOTE — Progress Notes (Signed)
    Cardiology Office Note  Date: 09/01/2022   ID: Kaylup Knable Dales, DOB 21-Apr-1930, MRN [id number]  History of Present Illness: Kevin [name] is a 89 y.o. male last seen in February.  He is here for a routine visit.  Reports chronic dyspnea on exertion, NYHA class II-III, no angina or nitroglycerin use.  He is functional with ADLs, does have to pace himself.  Using a cane, no recent falls.  I reviewed his medications which are stable from a cardiac perspective.  He has not had to use any nitroglycerin.  LDL was 47 in March of this year on Lipitor.  He continues to follow with nephrology, his most recent creatinine was 2.52.  ECG today shows sinus bradycardia with short PR interval and nonspecific ST changes.  Physical Exam: VS:  BP 132/76   Pulse (!) 54   Wt 221 lb 12.8 oz (100.6 kg)   SpO2 92%   BMI 35.80 kg/m , BMI Body mass index is 35.8 kg/m.  Wt Readings from Last 3 Encounters:  09/01/22 221 lb 12.8 oz (100.6 kg)  03/05/22 208 lb (94.3 kg)  09/02/21 211 lb 9.6 oz (96 kg)    General: Patient appears comfortable at rest. HEENT: Conjunctiva and lids normal. Neck: Supple, no elevated JVP or carotid bruits. Lungs: Clear to auscultation, nonlabored breathing at rest. Cardiac: Regular rate and rhythm, no S3, 1/6 systolic murmur. Extremities: No pitting edema.  ECG:  An ECG dated 09/02/2021 was personally reviewed today and demonstrated:  Sinus bradycardia with PAC, nonspecific T wave changes.  Labwork:  March 2024: Cholesterol 106, triglycerides 94, HDL 41, LDL 47 July 2024: Hemoglobin 12.0, platelets 144, potassium 3.9, BUN 47, creatinine 2.52  Other Studies Reviewed Today:  No interval cardiac testing for review today.  Assessment and Plan:  1.  Multivessel CAD status post DES to the LAD in 2014 with known occlusion of the mid to distal RCA associated with left-to-right collaterals and medically managed circumflex stenosis.  Last ischemic testing was in 2022, low risk  Lexiscan Myoview.  He reports chronic dyspnea on exertion, NYHA class II-III, but no active angina or nitroglycerin use.  Continue medical therapy including aspirin, Lipitor, Imdur, Lopressor, and as needed nitroglycerin.   2.  Mixed hyperlipidemia, doing well on Lipitor with most recent LDL 47 in March.   3.  Essential hypertension.  Blood pressure adequately controlled today.  He is also on hydralazine, Cozaar, and Norvasc.   4.  CKD stage IV, creatinine 2.52 in July with follow-up per nephrology.  Disposition:  Follow up  6 months.  Signed, Jonelle Sidle, M.D., F.A.C.C. [facility] at [facility]

## 2022-09-01 NOTE — Patient Instructions (Addendum)

## 2022-09-03 DIAGNOSIS — N184 Chronic kidney disease, stage 4 (severe): Secondary | ICD-10-CM | POA: Diagnosis not present

## 2022-09-03 DIAGNOSIS — I129 Hypertensive chronic kidney disease with stage 1 through stage 4 chronic kidney disease, or unspecified chronic kidney disease: Secondary | ICD-10-CM | POA: Diagnosis not present

## 2022-09-03 DIAGNOSIS — N2581 Secondary hyperparathyroidism of renal origin: Secondary | ICD-10-CM | POA: Diagnosis not present

## 2022-09-03 DIAGNOSIS — I5032 Chronic diastolic (congestive) heart failure: Secondary | ICD-10-CM | POA: Diagnosis not present

## 2022-09-09 DIAGNOSIS — Z87891 Personal history of nicotine dependence: Secondary | ICD-10-CM | POA: Diagnosis not present

## 2022-09-09 DIAGNOSIS — D631 Anemia in chronic kidney disease: Secondary | ICD-10-CM | POA: Diagnosis not present

## 2022-09-09 DIAGNOSIS — I2510 Atherosclerotic heart disease of native coronary artery without angina pectoris: Secondary | ICD-10-CM | POA: Diagnosis not present

## 2022-09-09 DIAGNOSIS — M109 Gout, unspecified: Secondary | ICD-10-CM | POA: Diagnosis not present

## 2022-09-09 DIAGNOSIS — Z9981 Dependence on supplemental oxygen: Secondary | ICD-10-CM | POA: Diagnosis not present

## 2022-09-09 DIAGNOSIS — R7989 Other specified abnormal findings of blood chemistry: Secondary | ICD-10-CM | POA: Diagnosis not present

## 2022-09-09 DIAGNOSIS — I081 Rheumatic disorders of both mitral and tricuspid valves: Secondary | ICD-10-CM | POA: Diagnosis not present

## 2022-09-09 DIAGNOSIS — R079 Chest pain, unspecified: Secondary | ICD-10-CM | POA: Diagnosis not present

## 2022-09-09 DIAGNOSIS — R0902 Hypoxemia: Secondary | ICD-10-CM | POA: Diagnosis not present

## 2022-09-09 DIAGNOSIS — I509 Heart failure, unspecified: Secondary | ICD-10-CM | POA: Diagnosis not present

## 2022-09-09 DIAGNOSIS — E8770 Fluid overload, unspecified: Secondary | ICD-10-CM | POA: Diagnosis not present

## 2022-09-09 DIAGNOSIS — I517 Cardiomegaly: Secondary | ICD-10-CM | POA: Diagnosis not present

## 2022-09-09 DIAGNOSIS — D539 Nutritional anemia, unspecified: Secondary | ICD-10-CM | POA: Diagnosis not present

## 2022-09-09 DIAGNOSIS — R918 Other nonspecific abnormal finding of lung field: Secondary | ICD-10-CM | POA: Diagnosis not present

## 2022-09-09 DIAGNOSIS — N184 Chronic kidney disease, stage 4 (severe): Secondary | ICD-10-CM | POA: Diagnosis not present

## 2022-09-09 DIAGNOSIS — E782 Mixed hyperlipidemia: Secondary | ICD-10-CM | POA: Diagnosis not present

## 2022-09-09 DIAGNOSIS — I700 Atherosclerosis of aorta: Secondary | ICD-10-CM | POA: Diagnosis not present

## 2022-09-09 DIAGNOSIS — I252 Old myocardial infarction: Secondary | ICD-10-CM | POA: Diagnosis not present

## 2022-09-09 DIAGNOSIS — J9601 Acute respiratory failure with hypoxia: Secondary | ICD-10-CM | POA: Diagnosis not present

## 2022-09-09 DIAGNOSIS — I130 Hypertensive heart and chronic kidney disease with heart failure and stage 1 through stage 4 chronic kidney disease, or unspecified chronic kidney disease: Secondary | ICD-10-CM | POA: Diagnosis not present

## 2022-09-09 DIAGNOSIS — R0789 Other chest pain: Secondary | ICD-10-CM | POA: Diagnosis not present

## 2022-09-09 DIAGNOSIS — Z79899 Other long term (current) drug therapy: Secondary | ICD-10-CM | POA: Diagnosis not present

## 2022-09-09 DIAGNOSIS — R0602 Shortness of breath: Secondary | ICD-10-CM | POA: Diagnosis not present

## 2022-09-09 DIAGNOSIS — Z720 Tobacco use: Secondary | ICD-10-CM | POA: Diagnosis not present

## 2022-09-09 DIAGNOSIS — I5030 Unspecified diastolic (congestive) heart failure: Secondary | ICD-10-CM | POA: Diagnosis not present

## 2022-09-09 DIAGNOSIS — I272 Pulmonary hypertension, unspecified: Secondary | ICD-10-CM | POA: Diagnosis not present

## 2022-09-09 DIAGNOSIS — Z955 Presence of coronary angioplasty implant and graft: Secondary | ICD-10-CM | POA: Diagnosis not present

## 2022-09-09 DIAGNOSIS — I493 Ventricular premature depolarization: Secondary | ICD-10-CM | POA: Diagnosis not present

## 2022-09-09 DIAGNOSIS — I5032 Chronic diastolic (congestive) heart failure: Secondary | ICD-10-CM | POA: Diagnosis not present

## 2022-09-09 DIAGNOSIS — D72829 Elevated white blood cell count, unspecified: Secondary | ICD-10-CM | POA: Diagnosis not present

## 2022-09-09 DIAGNOSIS — I5033 Acute on chronic diastolic (congestive) heart failure: Secondary | ICD-10-CM | POA: Diagnosis not present

## 2022-09-09 DIAGNOSIS — N183 Chronic kidney disease, stage 3 unspecified: Secondary | ICD-10-CM | POA: Diagnosis not present

## 2022-09-09 DIAGNOSIS — U071 COVID-19: Secondary | ICD-10-CM | POA: Diagnosis not present

## 2022-09-09 DIAGNOSIS — Z7982 Long term (current) use of aspirin: Secondary | ICD-10-CM | POA: Diagnosis not present

## 2022-09-16 ENCOUNTER — Ambulatory Visit: Payer: Medicare Other | Admitting: Nurse Practitioner

## 2022-09-18 DIAGNOSIS — R001 Bradycardia, unspecified: Secondary | ICD-10-CM | POA: Diagnosis not present

## 2022-09-18 DIAGNOSIS — J1282 Pneumonia due to coronavirus disease 2019: Secondary | ICD-10-CM | POA: Diagnosis not present

## 2022-09-18 DIAGNOSIS — I272 Pulmonary hypertension, unspecified: Secondary | ICD-10-CM | POA: Diagnosis not present

## 2022-09-18 DIAGNOSIS — U071 COVID-19: Secondary | ICD-10-CM | POA: Diagnosis not present

## 2022-09-18 DIAGNOSIS — Z9981 Dependence on supplemental oxygen: Secondary | ICD-10-CM | POA: Diagnosis not present

## 2022-09-18 DIAGNOSIS — N184 Chronic kidney disease, stage 4 (severe): Secondary | ICD-10-CM | POA: Diagnosis not present

## 2022-09-18 DIAGNOSIS — I130 Hypertensive heart and chronic kidney disease with heart failure and stage 1 through stage 4 chronic kidney disease, or unspecified chronic kidney disease: Secondary | ICD-10-CM | POA: Diagnosis not present

## 2022-09-18 DIAGNOSIS — I252 Old myocardial infarction: Secondary | ICD-10-CM | POA: Diagnosis not present

## 2022-09-18 DIAGNOSIS — I081 Rheumatic disorders of both mitral and tricuspid valves: Secondary | ICD-10-CM | POA: Diagnosis not present

## 2022-09-18 DIAGNOSIS — D72829 Elevated white blood cell count, unspecified: Secondary | ICD-10-CM | POA: Diagnosis not present

## 2022-09-18 DIAGNOSIS — J440 Chronic obstructive pulmonary disease with acute lower respiratory infection: Secondary | ICD-10-CM | POA: Diagnosis not present

## 2022-09-18 DIAGNOSIS — I2510 Atherosclerotic heart disease of native coronary artery without angina pectoris: Secondary | ICD-10-CM | POA: Diagnosis not present

## 2022-09-18 DIAGNOSIS — I5033 Acute on chronic diastolic (congestive) heart failure: Secondary | ICD-10-CM | POA: Diagnosis not present

## 2022-09-18 DIAGNOSIS — Z7982 Long term (current) use of aspirin: Secondary | ICD-10-CM | POA: Diagnosis not present

## 2022-09-18 DIAGNOSIS — J9601 Acute respiratory failure with hypoxia: Secondary | ICD-10-CM | POA: Diagnosis not present

## 2022-09-18 DIAGNOSIS — Z792 Long term (current) use of antibiotics: Secondary | ICD-10-CM | POA: Diagnosis not present

## 2022-09-19 DIAGNOSIS — I2510 Atherosclerotic heart disease of native coronary artery without angina pectoris: Secondary | ICD-10-CM | POA: Diagnosis not present

## 2022-09-19 DIAGNOSIS — I081 Rheumatic disorders of both mitral and tricuspid valves: Secondary | ICD-10-CM | POA: Diagnosis not present

## 2022-09-19 DIAGNOSIS — D72829 Elevated white blood cell count, unspecified: Secondary | ICD-10-CM | POA: Diagnosis not present

## 2022-09-19 DIAGNOSIS — I5033 Acute on chronic diastolic (congestive) heart failure: Secondary | ICD-10-CM | POA: Diagnosis not present

## 2022-09-19 DIAGNOSIS — Z7982 Long term (current) use of aspirin: Secondary | ICD-10-CM | POA: Diagnosis not present

## 2022-09-19 DIAGNOSIS — J1282 Pneumonia due to coronavirus disease 2019: Secondary | ICD-10-CM | POA: Diagnosis not present

## 2022-09-19 DIAGNOSIS — Z792 Long term (current) use of antibiotics: Secondary | ICD-10-CM | POA: Diagnosis not present

## 2022-09-19 DIAGNOSIS — I272 Pulmonary hypertension, unspecified: Secondary | ICD-10-CM | POA: Diagnosis not present

## 2022-09-19 DIAGNOSIS — J440 Chronic obstructive pulmonary disease with acute lower respiratory infection: Secondary | ICD-10-CM | POA: Diagnosis not present

## 2022-09-19 DIAGNOSIS — U071 COVID-19: Secondary | ICD-10-CM | POA: Diagnosis not present

## 2022-09-19 DIAGNOSIS — Z9981 Dependence on supplemental oxygen: Secondary | ICD-10-CM | POA: Diagnosis not present

## 2022-09-19 DIAGNOSIS — I130 Hypertensive heart and chronic kidney disease with heart failure and stage 1 through stage 4 chronic kidney disease, or unspecified chronic kidney disease: Secondary | ICD-10-CM | POA: Diagnosis not present

## 2022-09-19 DIAGNOSIS — I252 Old myocardial infarction: Secondary | ICD-10-CM | POA: Diagnosis not present

## 2022-09-19 DIAGNOSIS — J9601 Acute respiratory failure with hypoxia: Secondary | ICD-10-CM | POA: Diagnosis not present

## 2022-09-19 DIAGNOSIS — N184 Chronic kidney disease, stage 4 (severe): Secondary | ICD-10-CM | POA: Diagnosis not present

## 2022-09-19 DIAGNOSIS — R001 Bradycardia, unspecified: Secondary | ICD-10-CM | POA: Diagnosis not present

## 2022-09-23 DIAGNOSIS — N184 Chronic kidney disease, stage 4 (severe): Secondary | ICD-10-CM | POA: Diagnosis not present

## 2022-09-23 DIAGNOSIS — Z792 Long term (current) use of antibiotics: Secondary | ICD-10-CM | POA: Diagnosis not present

## 2022-09-23 DIAGNOSIS — Z9981 Dependence on supplemental oxygen: Secondary | ICD-10-CM | POA: Diagnosis not present

## 2022-09-23 DIAGNOSIS — I272 Pulmonary hypertension, unspecified: Secondary | ICD-10-CM | POA: Diagnosis not present

## 2022-09-23 DIAGNOSIS — J440 Chronic obstructive pulmonary disease with acute lower respiratory infection: Secondary | ICD-10-CM | POA: Diagnosis not present

## 2022-09-23 DIAGNOSIS — I2510 Atherosclerotic heart disease of native coronary artery without angina pectoris: Secondary | ICD-10-CM | POA: Diagnosis not present

## 2022-09-23 DIAGNOSIS — J1282 Pneumonia due to coronavirus disease 2019: Secondary | ICD-10-CM | POA: Diagnosis not present

## 2022-09-23 DIAGNOSIS — J9601 Acute respiratory failure with hypoxia: Secondary | ICD-10-CM | POA: Diagnosis not present

## 2022-09-23 DIAGNOSIS — Z7982 Long term (current) use of aspirin: Secondary | ICD-10-CM | POA: Diagnosis not present

## 2022-09-23 DIAGNOSIS — I252 Old myocardial infarction: Secondary | ICD-10-CM | POA: Diagnosis not present

## 2022-09-23 DIAGNOSIS — I5033 Acute on chronic diastolic (congestive) heart failure: Secondary | ICD-10-CM | POA: Diagnosis not present

## 2022-09-23 DIAGNOSIS — R001 Bradycardia, unspecified: Secondary | ICD-10-CM | POA: Diagnosis not present

## 2022-09-23 DIAGNOSIS — U071 COVID-19: Secondary | ICD-10-CM | POA: Diagnosis not present

## 2022-09-23 DIAGNOSIS — D72829 Elevated white blood cell count, unspecified: Secondary | ICD-10-CM | POA: Diagnosis not present

## 2022-09-23 DIAGNOSIS — I081 Rheumatic disorders of both mitral and tricuspid valves: Secondary | ICD-10-CM | POA: Diagnosis not present

## 2022-09-23 DIAGNOSIS — I130 Hypertensive heart and chronic kidney disease with heart failure and stage 1 through stage 4 chronic kidney disease, or unspecified chronic kidney disease: Secondary | ICD-10-CM | POA: Diagnosis not present

## 2022-09-24 DIAGNOSIS — I10 Essential (primary) hypertension: Secondary | ICD-10-CM | POA: Diagnosis not present

## 2022-09-24 DIAGNOSIS — J9611 Chronic respiratory failure with hypoxia: Secondary | ICD-10-CM | POA: Diagnosis not present

## 2022-09-24 DIAGNOSIS — U071 COVID-19: Secondary | ICD-10-CM | POA: Diagnosis not present

## 2022-09-24 DIAGNOSIS — Z09 Encounter for follow-up examination after completed treatment for conditions other than malignant neoplasm: Secondary | ICD-10-CM | POA: Diagnosis not present

## 2022-09-24 DIAGNOSIS — Z299 Encounter for prophylactic measures, unspecified: Secondary | ICD-10-CM | POA: Diagnosis not present

## 2022-09-24 DIAGNOSIS — J1282 Pneumonia due to coronavirus disease 2019: Secondary | ICD-10-CM | POA: Diagnosis not present

## 2022-09-25 DIAGNOSIS — J9601 Acute respiratory failure with hypoxia: Secondary | ICD-10-CM | POA: Diagnosis not present

## 2022-09-25 DIAGNOSIS — I272 Pulmonary hypertension, unspecified: Secondary | ICD-10-CM | POA: Diagnosis not present

## 2022-09-25 DIAGNOSIS — Z792 Long term (current) use of antibiotics: Secondary | ICD-10-CM | POA: Diagnosis not present

## 2022-09-25 DIAGNOSIS — J440 Chronic obstructive pulmonary disease with acute lower respiratory infection: Secondary | ICD-10-CM | POA: Diagnosis not present

## 2022-09-25 DIAGNOSIS — I5033 Acute on chronic diastolic (congestive) heart failure: Secondary | ICD-10-CM | POA: Diagnosis not present

## 2022-09-25 DIAGNOSIS — Z7982 Long term (current) use of aspirin: Secondary | ICD-10-CM | POA: Diagnosis not present

## 2022-09-25 DIAGNOSIS — I2510 Atherosclerotic heart disease of native coronary artery without angina pectoris: Secondary | ICD-10-CM | POA: Diagnosis not present

## 2022-09-25 DIAGNOSIS — I252 Old myocardial infarction: Secondary | ICD-10-CM | POA: Diagnosis not present

## 2022-09-25 DIAGNOSIS — U071 COVID-19: Secondary | ICD-10-CM | POA: Diagnosis not present

## 2022-09-25 DIAGNOSIS — N184 Chronic kidney disease, stage 4 (severe): Secondary | ICD-10-CM | POA: Diagnosis not present

## 2022-09-25 DIAGNOSIS — I130 Hypertensive heart and chronic kidney disease with heart failure and stage 1 through stage 4 chronic kidney disease, or unspecified chronic kidney disease: Secondary | ICD-10-CM | POA: Diagnosis not present

## 2022-09-25 DIAGNOSIS — J1282 Pneumonia due to coronavirus disease 2019: Secondary | ICD-10-CM | POA: Diagnosis not present

## 2022-09-25 DIAGNOSIS — R001 Bradycardia, unspecified: Secondary | ICD-10-CM | POA: Diagnosis not present

## 2022-09-25 DIAGNOSIS — I081 Rheumatic disorders of both mitral and tricuspid valves: Secondary | ICD-10-CM | POA: Diagnosis not present

## 2022-09-25 DIAGNOSIS — D72829 Elevated white blood cell count, unspecified: Secondary | ICD-10-CM | POA: Diagnosis not present

## 2022-09-25 DIAGNOSIS — Z9981 Dependence on supplemental oxygen: Secondary | ICD-10-CM | POA: Diagnosis not present

## 2022-09-30 DIAGNOSIS — I272 Pulmonary hypertension, unspecified: Secondary | ICD-10-CM | POA: Diagnosis not present

## 2022-09-30 DIAGNOSIS — I5033 Acute on chronic diastolic (congestive) heart failure: Secondary | ICD-10-CM | POA: Diagnosis not present

## 2022-09-30 DIAGNOSIS — I2510 Atherosclerotic heart disease of native coronary artery without angina pectoris: Secondary | ICD-10-CM | POA: Diagnosis not present

## 2022-09-30 DIAGNOSIS — I081 Rheumatic disorders of both mitral and tricuspid valves: Secondary | ICD-10-CM | POA: Diagnosis not present

## 2022-09-30 DIAGNOSIS — I252 Old myocardial infarction: Secondary | ICD-10-CM | POA: Diagnosis not present

## 2022-09-30 DIAGNOSIS — J1282 Pneumonia due to coronavirus disease 2019: Secondary | ICD-10-CM | POA: Diagnosis not present

## 2022-09-30 DIAGNOSIS — Z7982 Long term (current) use of aspirin: Secondary | ICD-10-CM | POA: Diagnosis not present

## 2022-09-30 DIAGNOSIS — I130 Hypertensive heart and chronic kidney disease with heart failure and stage 1 through stage 4 chronic kidney disease, or unspecified chronic kidney disease: Secondary | ICD-10-CM | POA: Diagnosis not present

## 2022-09-30 DIAGNOSIS — R001 Bradycardia, unspecified: Secondary | ICD-10-CM | POA: Diagnosis not present

## 2022-09-30 DIAGNOSIS — D72829 Elevated white blood cell count, unspecified: Secondary | ICD-10-CM | POA: Diagnosis not present

## 2022-09-30 DIAGNOSIS — J440 Chronic obstructive pulmonary disease with acute lower respiratory infection: Secondary | ICD-10-CM | POA: Diagnosis not present

## 2022-09-30 DIAGNOSIS — J9601 Acute respiratory failure with hypoxia: Secondary | ICD-10-CM | POA: Diagnosis not present

## 2022-09-30 DIAGNOSIS — Z792 Long term (current) use of antibiotics: Secondary | ICD-10-CM | POA: Diagnosis not present

## 2022-09-30 DIAGNOSIS — N184 Chronic kidney disease, stage 4 (severe): Secondary | ICD-10-CM | POA: Diagnosis not present

## 2022-09-30 DIAGNOSIS — Z9981 Dependence on supplemental oxygen: Secondary | ICD-10-CM | POA: Diagnosis not present

## 2022-09-30 DIAGNOSIS — U071 COVID-19: Secondary | ICD-10-CM | POA: Diagnosis not present

## 2022-10-01 DIAGNOSIS — I130 Hypertensive heart and chronic kidney disease with heart failure and stage 1 through stage 4 chronic kidney disease, or unspecified chronic kidney disease: Secondary | ICD-10-CM | POA: Diagnosis not present

## 2022-10-01 DIAGNOSIS — J440 Chronic obstructive pulmonary disease with acute lower respiratory infection: Secondary | ICD-10-CM | POA: Diagnosis not present

## 2022-10-01 DIAGNOSIS — R001 Bradycardia, unspecified: Secondary | ICD-10-CM | POA: Diagnosis not present

## 2022-10-01 DIAGNOSIS — Z7982 Long term (current) use of aspirin: Secondary | ICD-10-CM | POA: Diagnosis not present

## 2022-10-01 DIAGNOSIS — J9601 Acute respiratory failure with hypoxia: Secondary | ICD-10-CM | POA: Diagnosis not present

## 2022-10-01 DIAGNOSIS — N184 Chronic kidney disease, stage 4 (severe): Secondary | ICD-10-CM | POA: Diagnosis not present

## 2022-10-01 DIAGNOSIS — D72829 Elevated white blood cell count, unspecified: Secondary | ICD-10-CM | POA: Diagnosis not present

## 2022-10-01 DIAGNOSIS — I2510 Atherosclerotic heart disease of native coronary artery without angina pectoris: Secondary | ICD-10-CM | POA: Diagnosis not present

## 2022-10-01 DIAGNOSIS — I252 Old myocardial infarction: Secondary | ICD-10-CM | POA: Diagnosis not present

## 2022-10-01 DIAGNOSIS — Z9981 Dependence on supplemental oxygen: Secondary | ICD-10-CM | POA: Diagnosis not present

## 2022-10-01 DIAGNOSIS — Z792 Long term (current) use of antibiotics: Secondary | ICD-10-CM | POA: Diagnosis not present

## 2022-10-01 DIAGNOSIS — I081 Rheumatic disorders of both mitral and tricuspid valves: Secondary | ICD-10-CM | POA: Diagnosis not present

## 2022-10-01 DIAGNOSIS — I272 Pulmonary hypertension, unspecified: Secondary | ICD-10-CM | POA: Diagnosis not present

## 2022-10-01 DIAGNOSIS — I5033 Acute on chronic diastolic (congestive) heart failure: Secondary | ICD-10-CM | POA: Diagnosis not present

## 2022-10-01 DIAGNOSIS — U071 COVID-19: Secondary | ICD-10-CM | POA: Diagnosis not present

## 2022-10-01 DIAGNOSIS — J1282 Pneumonia due to coronavirus disease 2019: Secondary | ICD-10-CM | POA: Diagnosis not present

## 2022-10-02 DIAGNOSIS — Z299 Encounter for prophylactic measures, unspecified: Secondary | ICD-10-CM | POA: Diagnosis not present

## 2022-10-02 DIAGNOSIS — I10 Essential (primary) hypertension: Secondary | ICD-10-CM | POA: Diagnosis not present

## 2022-10-02 DIAGNOSIS — N184 Chronic kidney disease, stage 4 (severe): Secondary | ICD-10-CM | POA: Diagnosis not present

## 2022-10-02 DIAGNOSIS — I509 Heart failure, unspecified: Secondary | ICD-10-CM | POA: Diagnosis not present

## 2022-10-03 DIAGNOSIS — I081 Rheumatic disorders of both mitral and tricuspid valves: Secondary | ICD-10-CM | POA: Diagnosis not present

## 2022-10-03 DIAGNOSIS — N184 Chronic kidney disease, stage 4 (severe): Secondary | ICD-10-CM | POA: Diagnosis not present

## 2022-10-03 DIAGNOSIS — U071 COVID-19: Secondary | ICD-10-CM | POA: Diagnosis not present

## 2022-10-03 DIAGNOSIS — I252 Old myocardial infarction: Secondary | ICD-10-CM | POA: Diagnosis not present

## 2022-10-03 DIAGNOSIS — I5033 Acute on chronic diastolic (congestive) heart failure: Secondary | ICD-10-CM | POA: Diagnosis not present

## 2022-10-03 DIAGNOSIS — J440 Chronic obstructive pulmonary disease with acute lower respiratory infection: Secondary | ICD-10-CM | POA: Diagnosis not present

## 2022-10-03 DIAGNOSIS — I272 Pulmonary hypertension, unspecified: Secondary | ICD-10-CM | POA: Diagnosis not present

## 2022-10-03 DIAGNOSIS — I130 Hypertensive heart and chronic kidney disease with heart failure and stage 1 through stage 4 chronic kidney disease, or unspecified chronic kidney disease: Secondary | ICD-10-CM | POA: Diagnosis not present

## 2022-10-03 DIAGNOSIS — J1282 Pneumonia due to coronavirus disease 2019: Secondary | ICD-10-CM | POA: Diagnosis not present

## 2022-10-03 DIAGNOSIS — Z7982 Long term (current) use of aspirin: Secondary | ICD-10-CM | POA: Diagnosis not present

## 2022-10-03 DIAGNOSIS — Z792 Long term (current) use of antibiotics: Secondary | ICD-10-CM | POA: Diagnosis not present

## 2022-10-03 DIAGNOSIS — R001 Bradycardia, unspecified: Secondary | ICD-10-CM | POA: Diagnosis not present

## 2022-10-03 DIAGNOSIS — J9601 Acute respiratory failure with hypoxia: Secondary | ICD-10-CM | POA: Diagnosis not present

## 2022-10-03 DIAGNOSIS — Z9981 Dependence on supplemental oxygen: Secondary | ICD-10-CM | POA: Diagnosis not present

## 2022-10-03 DIAGNOSIS — D72829 Elevated white blood cell count, unspecified: Secondary | ICD-10-CM | POA: Diagnosis not present

## 2022-10-03 DIAGNOSIS — I2510 Atherosclerotic heart disease of native coronary artery without angina pectoris: Secondary | ICD-10-CM | POA: Diagnosis not present

## 2022-10-07 DIAGNOSIS — D72829 Elevated white blood cell count, unspecified: Secondary | ICD-10-CM | POA: Diagnosis not present

## 2022-10-07 DIAGNOSIS — I2510 Atherosclerotic heart disease of native coronary artery without angina pectoris: Secondary | ICD-10-CM | POA: Diagnosis not present

## 2022-10-07 DIAGNOSIS — Z7982 Long term (current) use of aspirin: Secondary | ICD-10-CM | POA: Diagnosis not present

## 2022-10-07 DIAGNOSIS — R001 Bradycardia, unspecified: Secondary | ICD-10-CM | POA: Diagnosis not present

## 2022-10-07 DIAGNOSIS — I5033 Acute on chronic diastolic (congestive) heart failure: Secondary | ICD-10-CM | POA: Diagnosis not present

## 2022-10-07 DIAGNOSIS — U071 COVID-19: Secondary | ICD-10-CM | POA: Diagnosis not present

## 2022-10-07 DIAGNOSIS — I272 Pulmonary hypertension, unspecified: Secondary | ICD-10-CM | POA: Diagnosis not present

## 2022-10-07 DIAGNOSIS — Z9981 Dependence on supplemental oxygen: Secondary | ICD-10-CM | POA: Diagnosis not present

## 2022-10-07 DIAGNOSIS — I130 Hypertensive heart and chronic kidney disease with heart failure and stage 1 through stage 4 chronic kidney disease, or unspecified chronic kidney disease: Secondary | ICD-10-CM | POA: Diagnosis not present

## 2022-10-07 DIAGNOSIS — N184 Chronic kidney disease, stage 4 (severe): Secondary | ICD-10-CM | POA: Diagnosis not present

## 2022-10-07 DIAGNOSIS — Z792 Long term (current) use of antibiotics: Secondary | ICD-10-CM | POA: Diagnosis not present

## 2022-10-07 DIAGNOSIS — I252 Old myocardial infarction: Secondary | ICD-10-CM | POA: Diagnosis not present

## 2022-10-07 DIAGNOSIS — I081 Rheumatic disorders of both mitral and tricuspid valves: Secondary | ICD-10-CM | POA: Diagnosis not present

## 2022-10-07 DIAGNOSIS — J1282 Pneumonia due to coronavirus disease 2019: Secondary | ICD-10-CM | POA: Diagnosis not present

## 2022-10-07 DIAGNOSIS — J440 Chronic obstructive pulmonary disease with acute lower respiratory infection: Secondary | ICD-10-CM | POA: Diagnosis not present

## 2022-10-07 DIAGNOSIS — J9601 Acute respiratory failure with hypoxia: Secondary | ICD-10-CM | POA: Diagnosis not present

## 2022-10-10 DIAGNOSIS — Z7982 Long term (current) use of aspirin: Secondary | ICD-10-CM | POA: Diagnosis not present

## 2022-10-10 DIAGNOSIS — I252 Old myocardial infarction: Secondary | ICD-10-CM | POA: Diagnosis not present

## 2022-10-10 DIAGNOSIS — J1282 Pneumonia due to coronavirus disease 2019: Secondary | ICD-10-CM | POA: Diagnosis not present

## 2022-10-10 DIAGNOSIS — J440 Chronic obstructive pulmonary disease with acute lower respiratory infection: Secondary | ICD-10-CM | POA: Diagnosis not present

## 2022-10-10 DIAGNOSIS — J9601 Acute respiratory failure with hypoxia: Secondary | ICD-10-CM | POA: Diagnosis not present

## 2022-10-10 DIAGNOSIS — Z792 Long term (current) use of antibiotics: Secondary | ICD-10-CM | POA: Diagnosis not present

## 2022-10-10 DIAGNOSIS — U071 COVID-19: Secondary | ICD-10-CM | POA: Diagnosis not present

## 2022-10-10 DIAGNOSIS — R001 Bradycardia, unspecified: Secondary | ICD-10-CM | POA: Diagnosis not present

## 2022-10-10 DIAGNOSIS — I5033 Acute on chronic diastolic (congestive) heart failure: Secondary | ICD-10-CM | POA: Diagnosis not present

## 2022-10-10 DIAGNOSIS — D72829 Elevated white blood cell count, unspecified: Secondary | ICD-10-CM | POA: Diagnosis not present

## 2022-10-10 DIAGNOSIS — Z9981 Dependence on supplemental oxygen: Secondary | ICD-10-CM | POA: Diagnosis not present

## 2022-10-10 DIAGNOSIS — I130 Hypertensive heart and chronic kidney disease with heart failure and stage 1 through stage 4 chronic kidney disease, or unspecified chronic kidney disease: Secondary | ICD-10-CM | POA: Diagnosis not present

## 2022-10-10 DIAGNOSIS — I2510 Atherosclerotic heart disease of native coronary artery without angina pectoris: Secondary | ICD-10-CM | POA: Diagnosis not present

## 2022-10-10 DIAGNOSIS — N184 Chronic kidney disease, stage 4 (severe): Secondary | ICD-10-CM | POA: Diagnosis not present

## 2022-10-10 DIAGNOSIS — I081 Rheumatic disorders of both mitral and tricuspid valves: Secondary | ICD-10-CM | POA: Diagnosis not present

## 2022-10-10 DIAGNOSIS — I272 Pulmonary hypertension, unspecified: Secondary | ICD-10-CM | POA: Diagnosis not present

## 2022-10-13 DIAGNOSIS — J9601 Acute respiratory failure with hypoxia: Secondary | ICD-10-CM | POA: Diagnosis not present

## 2022-10-13 DIAGNOSIS — Z9981 Dependence on supplemental oxygen: Secondary | ICD-10-CM | POA: Diagnosis not present

## 2022-10-13 DIAGNOSIS — I130 Hypertensive heart and chronic kidney disease with heart failure and stage 1 through stage 4 chronic kidney disease, or unspecified chronic kidney disease: Secondary | ICD-10-CM | POA: Diagnosis not present

## 2022-10-13 DIAGNOSIS — I10 Essential (primary) hypertension: Secondary | ICD-10-CM | POA: Diagnosis not present

## 2022-10-13 DIAGNOSIS — I272 Pulmonary hypertension, unspecified: Secondary | ICD-10-CM | POA: Diagnosis not present

## 2022-10-13 DIAGNOSIS — J1282 Pneumonia due to coronavirus disease 2019: Secondary | ICD-10-CM | POA: Diagnosis not present

## 2022-10-13 DIAGNOSIS — I081 Rheumatic disorders of both mitral and tricuspid valves: Secondary | ICD-10-CM | POA: Diagnosis not present

## 2022-10-13 DIAGNOSIS — I2510 Atherosclerotic heart disease of native coronary artery without angina pectoris: Secondary | ICD-10-CM | POA: Diagnosis not present

## 2022-10-13 DIAGNOSIS — I5033 Acute on chronic diastolic (congestive) heart failure: Secondary | ICD-10-CM | POA: Diagnosis not present

## 2022-10-13 DIAGNOSIS — I509 Heart failure, unspecified: Secondary | ICD-10-CM | POA: Diagnosis not present

## 2022-10-13 DIAGNOSIS — J440 Chronic obstructive pulmonary disease with acute lower respiratory infection: Secondary | ICD-10-CM | POA: Diagnosis not present

## 2022-10-13 DIAGNOSIS — Z7982 Long term (current) use of aspirin: Secondary | ICD-10-CM | POA: Diagnosis not present

## 2022-10-13 DIAGNOSIS — Z792 Long term (current) use of antibiotics: Secondary | ICD-10-CM | POA: Diagnosis not present

## 2022-10-13 DIAGNOSIS — D72829 Elevated white blood cell count, unspecified: Secondary | ICD-10-CM | POA: Diagnosis not present

## 2022-10-13 DIAGNOSIS — R001 Bradycardia, unspecified: Secondary | ICD-10-CM | POA: Diagnosis not present

## 2022-10-13 DIAGNOSIS — N184 Chronic kidney disease, stage 4 (severe): Secondary | ICD-10-CM | POA: Diagnosis not present

## 2022-10-13 DIAGNOSIS — U071 COVID-19: Secondary | ICD-10-CM | POA: Diagnosis not present

## 2022-10-13 DIAGNOSIS — I252 Old myocardial infarction: Secondary | ICD-10-CM | POA: Diagnosis not present

## 2022-10-13 DIAGNOSIS — Z299 Encounter for prophylactic measures, unspecified: Secondary | ICD-10-CM | POA: Diagnosis not present

## 2022-10-16 DIAGNOSIS — M79674 Pain in right toe(s): Secondary | ICD-10-CM | POA: Diagnosis not present

## 2022-10-16 DIAGNOSIS — M79671 Pain in right foot: Secondary | ICD-10-CM | POA: Diagnosis not present

## 2022-10-16 DIAGNOSIS — M79672 Pain in left foot: Secondary | ICD-10-CM | POA: Diagnosis not present

## 2022-10-16 DIAGNOSIS — M79675 Pain in left toe(s): Secondary | ICD-10-CM | POA: Diagnosis not present

## 2022-10-16 DIAGNOSIS — I739 Peripheral vascular disease, unspecified: Secondary | ICD-10-CM | POA: Diagnosis not present

## 2022-10-17 DIAGNOSIS — I130 Hypertensive heart and chronic kidney disease with heart failure and stage 1 through stage 4 chronic kidney disease, or unspecified chronic kidney disease: Secondary | ICD-10-CM | POA: Diagnosis not present

## 2022-10-17 DIAGNOSIS — I272 Pulmonary hypertension, unspecified: Secondary | ICD-10-CM | POA: Diagnosis not present

## 2022-10-17 DIAGNOSIS — J440 Chronic obstructive pulmonary disease with acute lower respiratory infection: Secondary | ICD-10-CM | POA: Diagnosis not present

## 2022-10-17 DIAGNOSIS — Z9981 Dependence on supplemental oxygen: Secondary | ICD-10-CM | POA: Diagnosis not present

## 2022-10-17 DIAGNOSIS — Z7982 Long term (current) use of aspirin: Secondary | ICD-10-CM | POA: Diagnosis not present

## 2022-10-17 DIAGNOSIS — J9601 Acute respiratory failure with hypoxia: Secondary | ICD-10-CM | POA: Diagnosis not present

## 2022-10-17 DIAGNOSIS — J1282 Pneumonia due to coronavirus disease 2019: Secondary | ICD-10-CM | POA: Diagnosis not present

## 2022-10-17 DIAGNOSIS — Z792 Long term (current) use of antibiotics: Secondary | ICD-10-CM | POA: Diagnosis not present

## 2022-10-17 DIAGNOSIS — I5033 Acute on chronic diastolic (congestive) heart failure: Secondary | ICD-10-CM | POA: Diagnosis not present

## 2022-10-17 DIAGNOSIS — N184 Chronic kidney disease, stage 4 (severe): Secondary | ICD-10-CM | POA: Diagnosis not present

## 2022-10-17 DIAGNOSIS — U071 COVID-19: Secondary | ICD-10-CM | POA: Diagnosis not present

## 2022-10-17 DIAGNOSIS — I081 Rheumatic disorders of both mitral and tricuspid valves: Secondary | ICD-10-CM | POA: Diagnosis not present

## 2022-10-17 DIAGNOSIS — I2510 Atherosclerotic heart disease of native coronary artery without angina pectoris: Secondary | ICD-10-CM | POA: Diagnosis not present

## 2022-10-17 DIAGNOSIS — D72829 Elevated white blood cell count, unspecified: Secondary | ICD-10-CM | POA: Diagnosis not present

## 2022-10-17 DIAGNOSIS — R001 Bradycardia, unspecified: Secondary | ICD-10-CM | POA: Diagnosis not present

## 2022-10-17 DIAGNOSIS — I252 Old myocardial infarction: Secondary | ICD-10-CM | POA: Diagnosis not present

## 2022-10-23 DIAGNOSIS — I252 Old myocardial infarction: Secondary | ICD-10-CM | POA: Diagnosis not present

## 2022-10-23 DIAGNOSIS — Z7982 Long term (current) use of aspirin: Secondary | ICD-10-CM | POA: Diagnosis not present

## 2022-10-23 DIAGNOSIS — D72829 Elevated white blood cell count, unspecified: Secondary | ICD-10-CM | POA: Diagnosis not present

## 2022-10-23 DIAGNOSIS — Z9981 Dependence on supplemental oxygen: Secondary | ICD-10-CM | POA: Diagnosis not present

## 2022-10-23 DIAGNOSIS — J440 Chronic obstructive pulmonary disease with acute lower respiratory infection: Secondary | ICD-10-CM | POA: Diagnosis not present

## 2022-10-23 DIAGNOSIS — I5033 Acute on chronic diastolic (congestive) heart failure: Secondary | ICD-10-CM | POA: Diagnosis not present

## 2022-10-23 DIAGNOSIS — I081 Rheumatic disorders of both mitral and tricuspid valves: Secondary | ICD-10-CM | POA: Diagnosis not present

## 2022-10-23 DIAGNOSIS — R001 Bradycardia, unspecified: Secondary | ICD-10-CM | POA: Diagnosis not present

## 2022-10-23 DIAGNOSIS — Z792 Long term (current) use of antibiotics: Secondary | ICD-10-CM | POA: Diagnosis not present

## 2022-10-23 DIAGNOSIS — J9601 Acute respiratory failure with hypoxia: Secondary | ICD-10-CM | POA: Diagnosis not present

## 2022-10-23 DIAGNOSIS — I2510 Atherosclerotic heart disease of native coronary artery without angina pectoris: Secondary | ICD-10-CM | POA: Diagnosis not present

## 2022-10-23 DIAGNOSIS — J1282 Pneumonia due to coronavirus disease 2019: Secondary | ICD-10-CM | POA: Diagnosis not present

## 2022-10-23 DIAGNOSIS — N184 Chronic kidney disease, stage 4 (severe): Secondary | ICD-10-CM | POA: Diagnosis not present

## 2022-10-23 DIAGNOSIS — U071 COVID-19: Secondary | ICD-10-CM | POA: Diagnosis not present

## 2022-10-23 DIAGNOSIS — I130 Hypertensive heart and chronic kidney disease with heart failure and stage 1 through stage 4 chronic kidney disease, or unspecified chronic kidney disease: Secondary | ICD-10-CM | POA: Diagnosis not present

## 2022-10-23 DIAGNOSIS — I272 Pulmonary hypertension, unspecified: Secondary | ICD-10-CM | POA: Diagnosis not present

## 2022-10-30 DIAGNOSIS — J440 Chronic obstructive pulmonary disease with acute lower respiratory infection: Secondary | ICD-10-CM | POA: Diagnosis not present

## 2022-10-30 DIAGNOSIS — J9601 Acute respiratory failure with hypoxia: Secondary | ICD-10-CM | POA: Diagnosis not present

## 2022-10-30 DIAGNOSIS — I252 Old myocardial infarction: Secondary | ICD-10-CM | POA: Diagnosis not present

## 2022-10-30 DIAGNOSIS — Z792 Long term (current) use of antibiotics: Secondary | ICD-10-CM | POA: Diagnosis not present

## 2022-10-30 DIAGNOSIS — I130 Hypertensive heart and chronic kidney disease with heart failure and stage 1 through stage 4 chronic kidney disease, or unspecified chronic kidney disease: Secondary | ICD-10-CM | POA: Diagnosis not present

## 2022-10-30 DIAGNOSIS — D72829 Elevated white blood cell count, unspecified: Secondary | ICD-10-CM | POA: Diagnosis not present

## 2022-10-30 DIAGNOSIS — U071 COVID-19: Secondary | ICD-10-CM | POA: Diagnosis not present

## 2022-10-30 DIAGNOSIS — I272 Pulmonary hypertension, unspecified: Secondary | ICD-10-CM | POA: Diagnosis not present

## 2022-10-30 DIAGNOSIS — N184 Chronic kidney disease, stage 4 (severe): Secondary | ICD-10-CM | POA: Diagnosis not present

## 2022-10-30 DIAGNOSIS — I2510 Atherosclerotic heart disease of native coronary artery without angina pectoris: Secondary | ICD-10-CM | POA: Diagnosis not present

## 2022-10-30 DIAGNOSIS — Z9981 Dependence on supplemental oxygen: Secondary | ICD-10-CM | POA: Diagnosis not present

## 2022-10-30 DIAGNOSIS — Z7982 Long term (current) use of aspirin: Secondary | ICD-10-CM | POA: Diagnosis not present

## 2022-10-30 DIAGNOSIS — I081 Rheumatic disorders of both mitral and tricuspid valves: Secondary | ICD-10-CM | POA: Diagnosis not present

## 2022-10-30 DIAGNOSIS — R001 Bradycardia, unspecified: Secondary | ICD-10-CM | POA: Diagnosis not present

## 2022-10-30 DIAGNOSIS — J1282 Pneumonia due to coronavirus disease 2019: Secondary | ICD-10-CM | POA: Diagnosis not present

## 2022-10-30 DIAGNOSIS — I5033 Acute on chronic diastolic (congestive) heart failure: Secondary | ICD-10-CM | POA: Diagnosis not present

## 2022-11-03 ENCOUNTER — Encounter: Payer: Self-pay | Admitting: Cardiology

## 2022-11-03 ENCOUNTER — Ambulatory Visit: Payer: Medicare Other | Attending: Nurse Practitioner | Admitting: Cardiology

## 2022-11-03 VITALS — BP 150/64 | HR 54 | Ht 66.0 in | Wt 211.6 lb

## 2022-11-03 DIAGNOSIS — I252 Old myocardial infarction: Secondary | ICD-10-CM | POA: Diagnosis not present

## 2022-11-03 DIAGNOSIS — Z87891 Personal history of nicotine dependence: Secondary | ICD-10-CM | POA: Diagnosis not present

## 2022-11-03 DIAGNOSIS — Z79899 Other long term (current) drug therapy: Secondary | ICD-10-CM | POA: Diagnosis not present

## 2022-11-03 DIAGNOSIS — S2020XA Contusion of thorax, unspecified, initial encounter: Secondary | ICD-10-CM | POA: Diagnosis not present

## 2022-11-03 DIAGNOSIS — S299XXA Unspecified injury of thorax, initial encounter: Secondary | ICD-10-CM | POA: Diagnosis not present

## 2022-11-03 DIAGNOSIS — R001 Bradycardia, unspecified: Secondary | ICD-10-CM | POA: Diagnosis not present

## 2022-11-03 DIAGNOSIS — R2689 Other abnormalities of gait and mobility: Secondary | ICD-10-CM | POA: Diagnosis not present

## 2022-11-03 DIAGNOSIS — R55 Syncope and collapse: Secondary | ICD-10-CM | POA: Diagnosis not present

## 2022-11-03 DIAGNOSIS — N184 Chronic kidney disease, stage 4 (severe): Secondary | ICD-10-CM

## 2022-11-03 DIAGNOSIS — Z743 Need for continuous supervision: Secondary | ICD-10-CM | POA: Diagnosis not present

## 2022-11-03 DIAGNOSIS — R072 Precordial pain: Secondary | ICD-10-CM | POA: Diagnosis not present

## 2022-11-03 DIAGNOSIS — I5032 Chronic diastolic (congestive) heart failure: Secondary | ICD-10-CM

## 2022-11-03 DIAGNOSIS — E782 Mixed hyperlipidemia: Secondary | ICD-10-CM

## 2022-11-03 DIAGNOSIS — I2510 Atherosclerotic heart disease of native coronary artery without angina pectoris: Secondary | ICD-10-CM | POA: Diagnosis not present

## 2022-11-03 DIAGNOSIS — I5031 Acute diastolic (congestive) heart failure: Secondary | ICD-10-CM | POA: Diagnosis not present

## 2022-11-03 DIAGNOSIS — I25119 Atherosclerotic heart disease of native coronary artery with unspecified angina pectoris: Secondary | ICD-10-CM | POA: Diagnosis not present

## 2022-11-03 DIAGNOSIS — R918 Other nonspecific abnormal finding of lung field: Secondary | ICD-10-CM | POA: Diagnosis not present

## 2022-11-03 DIAGNOSIS — R079 Chest pain, unspecified: Secondary | ICD-10-CM | POA: Diagnosis not present

## 2022-11-03 DIAGNOSIS — I10 Essential (primary) hypertension: Secondary | ICD-10-CM | POA: Diagnosis not present

## 2022-11-03 DIAGNOSIS — D649 Anemia, unspecified: Secondary | ICD-10-CM | POA: Diagnosis not present

## 2022-11-03 DIAGNOSIS — N185 Chronic kidney disease, stage 5: Secondary | ICD-10-CM | POA: Diagnosis not present

## 2022-11-03 DIAGNOSIS — I132 Hypertensive heart and chronic kidney disease with heart failure and with stage 5 chronic kidney disease, or end stage renal disease: Secondary | ICD-10-CM | POA: Diagnosis not present

## 2022-11-03 DIAGNOSIS — S20211A Contusion of right front wall of thorax, initial encounter: Secondary | ICD-10-CM | POA: Diagnosis not present

## 2022-11-03 DIAGNOSIS — Z7982 Long term (current) use of aspirin: Secondary | ICD-10-CM | POA: Diagnosis not present

## 2022-11-03 NOTE — Progress Notes (Signed)
Cardiology Office Note  Date: 11/03/2022   ID: [name], DOB 06-05-1930, MRN [id number]  History of Present Illness: Kevin [name] is a 89 y.o. male last seen in August.  Records indicate subsequent admission to [facility] in late August with hypoxic respiratory failure in association with COVID-19 pneumonia, also possible component of HFpEF.  He did not rule in for ACS.  Follow-up echocardiogram at that time revealed LVEF 60 to 65% with moderate mitral regurgitation, moderate tricuspid regurgitation, and severely elevated estimated RVSP of 62 mmHg.  He is here today with his daughter for a follow-up visit.  Wearing oxygen via nasal cannula, using a rolling walker.  He does report improved dyspnea and leg swelling on higher dose Lasix which is currently at 40 mg twice daily.  He has follow-up with his PCP later this month.  I reviewed his medications, only other change was a decrease in Norvasc to 5 mg daily by PCP.  I have asked him to track blood pressure at home as this may need to be increased again.  He is otherwise on hydralazine, Imdur, Lopressor, and Cozaar.  He does not report any nitroglycerin use.  Physical Exam: VS:  BP (!) 150/64 (BP Location: Right Arm, Cuff Size: Large)   Pulse (!) 54   Ht 5\' 6"  (1.676 m)   Wt 211 lb 9.6 oz (96 kg)   SpO2 96%   BMI 34.15 kg/m , BMI Body mass index is 34.15 kg/m.  Wt Readings from Last 3 Encounters:  11/03/22 211 lb 9.6 oz (96 kg)  09/01/22 221 lb 12.8 oz (100.6 kg)  03/05/22 208 lb (94.3 kg)    General: Patient appears comfortable at rest.  Wearing oxygen via nasal cannula. HEENT: Conjunctiva and lids normal. Neck: Supple, no elevated JVP or carotid bruits. Lungs: Clear to auscultation, nonlabored breathing at rest. Cardiac: Regular rate and rhythm, no S3, 1/6 systolic murmur, no pericardial rub. Extremities: Improved leg edema.  ECG:  An ECG dated 09/01/2022 was personally reviewed today and demonstrated:  Sinus  bradycardia with short PR interval and nonspecific ST changes.  Labwork:  March 2024: Cholesterol 106, triglycerides 94, HDL 41, LDL 47 July 2024: Hemoglobin 12.0, platelets 144, potassium 3.9, BUN 47, creatinine 2.52 August 2024: Hemoglobin 10.5, platelets 134, pro-BNP 4457, potassium 4.0, BUN 59, creatinine 2.68, COVID-19 positive, high-sensitivity troponin I 18 and 24  Other Studies Reviewed Today:  Echocardiogram 09/10/2022 [facility]): Summary   1. The left ventricle is normal in size with mildly increased wall  thickness.   2. The left ventricular systolic function is normal, LVEF is visually  estimated at 60-65%.    3. There is moderate mitral valve regurgitation.    4. The aortic valve is trileaflet with mildly thickened leaflets with normal  excursion.   5. The left atrium is moderately to severely dilated in size.    6. The right ventricle is upper normal in size, with normal systolic  function.   7. There is moderate tricuspid regurgitation.    8. There is severe pulmonary hypertension.    9. The right atrium is moderately dilated in size.    10. IVC size and inspiratory change suggest elevated right atrial pressure.  (10-20 mmHg).   Assessment and Plan:  1.  Multivessel CAD status post DES to the LAD in 2014 with known occlusion of the mid to distal RCA associated with left-to-right collaterals and medically managed circumflex stenosis.  Last ischemic testing was in 2022,  low risk Lexiscan Myoview.  No evidence of ACS during hospitalization in August as discussed above.  Continue aspirin, Lipitor, and as needed nitroglycerin.   2.  Mixed hyperlipidemia, doing well on Lipitor with most recent LDL 47 in March.  Continue current dose of Lipitor.   3.  Primary hypertension.  On multimodal therapy including hydralazine, Imdur, Cozaar, Lopressor, and Norvasc.  Continue to track blood pressure at home in case further adjustments are necessary.  He has resistant  hypertension.  4.  HFpEF, LVEF 60 to 65% with moderate mitral regurgitation, moderate tricuspid regurgitation, and severely elevated estimated RVSP.  Symptoms improved with increase in Lasix to 40 mg twice daily.  Not candidate for MRI.  Could consider cautious addition of SGLT2 inhibitor.   5.  CKD stage IV, creatinine 2.68 in August.  Also complicates management of problems 3 and 4.  Disposition:  Follow up  4 to 6 weeks in the [place] office.  Signed, Jonelle Sidle, M.D., F.A.C.C. [facility] at [facility]

## 2022-11-03 NOTE — Patient Instructions (Addendum)
Medication Instructions:  Your physician recommends that you continue on your current medications as directed. Please refer to the Current Medication list given to you today.  Labwork: none  Testing/Procedures: none  Follow-Up: Your physician recommends that you schedule a follow-up appointment in: 4-6 weeks in [place].  Any Other Special Instructions Will Be Listed Below (If Applicable).  If you need a refill on your cardiac medications before your next appointment, please call your pharmacy.

## 2022-11-04 DIAGNOSIS — N184 Chronic kidney disease, stage 4 (severe): Secondary | ICD-10-CM | POA: Diagnosis not present

## 2022-11-04 DIAGNOSIS — Z87448 Personal history of other diseases of urinary system: Secondary | ICD-10-CM | POA: Diagnosis not present

## 2022-11-04 DIAGNOSIS — X58XXXD Exposure to other specified factors, subsequent encounter: Secondary | ICD-10-CM | POA: Diagnosis not present

## 2022-11-04 DIAGNOSIS — I2510 Atherosclerotic heart disease of native coronary artery without angina pectoris: Secondary | ICD-10-CM | POA: Diagnosis not present

## 2022-11-04 DIAGNOSIS — D649 Anemia, unspecified: Secondary | ICD-10-CM | POA: Diagnosis not present

## 2022-11-04 DIAGNOSIS — S20219D Contusion of unspecified front wall of thorax, subsequent encounter: Secondary | ICD-10-CM | POA: Diagnosis not present

## 2022-11-04 DIAGNOSIS — I5030 Unspecified diastolic (congestive) heart failure: Secondary | ICD-10-CM | POA: Diagnosis not present

## 2022-11-04 DIAGNOSIS — I130 Hypertensive heart and chronic kidney disease with heart failure and stage 1 through stage 4 chronic kidney disease, or unspecified chronic kidney disease: Secondary | ICD-10-CM | POA: Diagnosis not present

## 2022-11-04 DIAGNOSIS — R55 Syncope and collapse: Secondary | ICD-10-CM | POA: Diagnosis not present

## 2022-11-05 DIAGNOSIS — I130 Hypertensive heart and chronic kidney disease with heart failure and stage 1 through stage 4 chronic kidney disease, or unspecified chronic kidney disease: Secondary | ICD-10-CM | POA: Diagnosis not present

## 2022-11-05 DIAGNOSIS — I272 Pulmonary hypertension, unspecified: Secondary | ICD-10-CM | POA: Diagnosis not present

## 2022-11-05 DIAGNOSIS — N184 Chronic kidney disease, stage 4 (severe): Secondary | ICD-10-CM | POA: Diagnosis not present

## 2022-11-05 DIAGNOSIS — D649 Anemia, unspecified: Secondary | ICD-10-CM | POA: Diagnosis not present

## 2022-11-05 DIAGNOSIS — S20219D Contusion of unspecified front wall of thorax, subsequent encounter: Secondary | ICD-10-CM | POA: Diagnosis not present

## 2022-11-05 DIAGNOSIS — R55 Syncope and collapse: Secondary | ICD-10-CM | POA: Diagnosis not present

## 2022-11-05 DIAGNOSIS — I10 Essential (primary) hypertension: Secondary | ICD-10-CM | POA: Diagnosis not present

## 2022-11-05 DIAGNOSIS — I5031 Acute diastolic (congestive) heart failure: Secondary | ICD-10-CM | POA: Diagnosis not present

## 2022-11-05 DIAGNOSIS — I2510 Atherosclerotic heart disease of native coronary artery without angina pectoris: Secondary | ICD-10-CM | POA: Diagnosis not present

## 2022-11-05 DIAGNOSIS — I5030 Unspecified diastolic (congestive) heart failure: Secondary | ICD-10-CM | POA: Diagnosis not present

## 2022-11-07 ENCOUNTER — Ambulatory Visit: Payer: Medicare Other | Admitting: Nurse Practitioner

## 2022-11-11 DIAGNOSIS — H401123 Primary open-angle glaucoma, left eye, severe stage: Secondary | ICD-10-CM | POA: Diagnosis not present

## 2022-11-12 DIAGNOSIS — I252 Old myocardial infarction: Secondary | ICD-10-CM | POA: Diagnosis not present

## 2022-11-12 DIAGNOSIS — I130 Hypertensive heart and chronic kidney disease with heart failure and stage 1 through stage 4 chronic kidney disease, or unspecified chronic kidney disease: Secondary | ICD-10-CM | POA: Diagnosis not present

## 2022-11-12 DIAGNOSIS — Z7982 Long term (current) use of aspirin: Secondary | ICD-10-CM | POA: Diagnosis not present

## 2022-11-12 DIAGNOSIS — I5033 Acute on chronic diastolic (congestive) heart failure: Secondary | ICD-10-CM | POA: Diagnosis not present

## 2022-11-12 DIAGNOSIS — I272 Pulmonary hypertension, unspecified: Secondary | ICD-10-CM | POA: Diagnosis not present

## 2022-11-12 DIAGNOSIS — N184 Chronic kidney disease, stage 4 (severe): Secondary | ICD-10-CM | POA: Diagnosis not present

## 2022-11-12 DIAGNOSIS — J440 Chronic obstructive pulmonary disease with acute lower respiratory infection: Secondary | ICD-10-CM | POA: Diagnosis not present

## 2022-11-12 DIAGNOSIS — U071 COVID-19: Secondary | ICD-10-CM | POA: Diagnosis not present

## 2022-11-12 DIAGNOSIS — R001 Bradycardia, unspecified: Secondary | ICD-10-CM | POA: Diagnosis not present

## 2022-11-12 DIAGNOSIS — J9601 Acute respiratory failure with hypoxia: Secondary | ICD-10-CM | POA: Diagnosis not present

## 2022-11-12 DIAGNOSIS — I2510 Atherosclerotic heart disease of native coronary artery without angina pectoris: Secondary | ICD-10-CM | POA: Diagnosis not present

## 2022-11-12 DIAGNOSIS — Z792 Long term (current) use of antibiotics: Secondary | ICD-10-CM | POA: Diagnosis not present

## 2022-11-12 DIAGNOSIS — Z9981 Dependence on supplemental oxygen: Secondary | ICD-10-CM | POA: Diagnosis not present

## 2022-11-12 DIAGNOSIS — I081 Rheumatic disorders of both mitral and tricuspid valves: Secondary | ICD-10-CM | POA: Diagnosis not present

## 2022-11-12 DIAGNOSIS — D72829 Elevated white blood cell count, unspecified: Secondary | ICD-10-CM | POA: Diagnosis not present

## 2022-11-12 DIAGNOSIS — J1282 Pneumonia due to coronavirus disease 2019: Secondary | ICD-10-CM | POA: Diagnosis not present
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
## 2022-11-19 ENCOUNTER — Ambulatory Visit: Payer: Medicare Other | Admitting: Student

## 2022-11-21 DIAGNOSIS — N184 Chronic kidney disease, stage 4 (severe): Secondary | ICD-10-CM | POA: Diagnosis not present

## 2022-11-21 DIAGNOSIS — D649 Anemia, unspecified: Secondary | ICD-10-CM | POA: Diagnosis not present

## 2022-11-21 DIAGNOSIS — I509 Heart failure, unspecified: Secondary | ICD-10-CM | POA: Diagnosis not present

## 2022-11-21 DIAGNOSIS — I10 Essential (primary) hypertension: Secondary | ICD-10-CM | POA: Diagnosis not present

## 2022-11-21 DIAGNOSIS — J309 Allergic rhinitis, unspecified: Secondary | ICD-10-CM | POA: Diagnosis not present

## 2022-11-21 DIAGNOSIS — Z299 Encounter for prophylactic measures, unspecified: Secondary | ICD-10-CM | POA: Diagnosis not present

## 2022-11-26 DIAGNOSIS — I5032 Chronic diastolic (congestive) heart failure: Secondary | ICD-10-CM | POA: Diagnosis not present

## 2022-11-26 DIAGNOSIS — R809 Proteinuria, unspecified: Secondary | ICD-10-CM | POA: Diagnosis not present

## 2022-11-26 DIAGNOSIS — I129 Hypertensive chronic kidney disease with stage 1 through stage 4 chronic kidney disease, or unspecified chronic kidney disease: Secondary | ICD-10-CM | POA: Diagnosis not present

## 2022-11-26 DIAGNOSIS — N184 Chronic kidney disease, stage 4 (severe): Secondary | ICD-10-CM | POA: Diagnosis not present

## 2022-11-27 DIAGNOSIS — R55 Syncope and collapse: Secondary | ICD-10-CM | POA: Diagnosis not present

## 2022-12-04 DIAGNOSIS — R94120 Abnormal auditory function study: Secondary | ICD-10-CM | POA: Diagnosis not present

## 2022-12-04 DIAGNOSIS — H25811 Combined forms of age-related cataract, right eye: Secondary | ICD-10-CM | POA: Diagnosis not present

## 2022-12-05 DIAGNOSIS — J9611 Chronic respiratory failure with hypoxia: Secondary | ICD-10-CM | POA: Diagnosis not present

## 2022-12-05 DIAGNOSIS — N184 Chronic kidney disease, stage 4 (severe): Secondary | ICD-10-CM | POA: Diagnosis not present

## 2022-12-05 DIAGNOSIS — E875 Hyperkalemia: Secondary | ICD-10-CM | POA: Diagnosis not present

## 2022-12-05 DIAGNOSIS — I509 Heart failure, unspecified: Secondary | ICD-10-CM | POA: Diagnosis not present

## 2022-12-05 DIAGNOSIS — I10 Essential (primary) hypertension: Secondary | ICD-10-CM | POA: Diagnosis not present

## 2022-12-05 DIAGNOSIS — Z299 Encounter for prophylactic measures, unspecified: Secondary | ICD-10-CM | POA: Diagnosis not present

## 2022-12-08 DIAGNOSIS — I5032 Chronic diastolic (congestive) heart failure: Secondary | ICD-10-CM | POA: Diagnosis not present

## 2022-12-08 DIAGNOSIS — N184 Chronic kidney disease, stage 4 (severe): Secondary | ICD-10-CM | POA: Diagnosis not present

## 2022-12-08 DIAGNOSIS — N2581 Secondary hyperparathyroidism of renal origin: Secondary | ICD-10-CM | POA: Diagnosis not present

## 2022-12-08 DIAGNOSIS — I129 Hypertensive chronic kidney disease with stage 1 through stage 4 chronic kidney disease, or unspecified chronic kidney disease: Secondary | ICD-10-CM | POA: Diagnosis not present

## 2022-12-16 ENCOUNTER — Encounter: Payer: Self-pay | Admitting: Nurse Practitioner

## 2022-12-16 ENCOUNTER — Ambulatory Visit: Payer: Medicare Other | Attending: Nurse Practitioner | Admitting: Nurse Practitioner

## 2022-12-16 VITALS — BP 120/60 | HR 57 | Ht 66.0 in | Wt 204.0 lb

## 2022-12-16 DIAGNOSIS — I2510 Atherosclerotic heart disease of native coronary artery without angina pectoris: Secondary | ICD-10-CM

## 2022-12-16 DIAGNOSIS — I10 Essential (primary) hypertension: Secondary | ICD-10-CM

## 2022-12-16 DIAGNOSIS — I5032 Chronic diastolic (congestive) heart failure: Secondary | ICD-10-CM | POA: Diagnosis not present

## 2022-12-16 DIAGNOSIS — E785 Hyperlipidemia, unspecified: Secondary | ICD-10-CM

## 2022-12-16 DIAGNOSIS — R911 Solitary pulmonary nodule: Secondary | ICD-10-CM | POA: Diagnosis not present

## 2022-12-16 DIAGNOSIS — Z87898 Personal history of other specified conditions: Secondary | ICD-10-CM

## 2022-12-16 DIAGNOSIS — N184 Chronic kidney disease, stage 4 (severe): Secondary | ICD-10-CM

## 2022-12-16 DIAGNOSIS — I272 Pulmonary hypertension, unspecified: Secondary | ICD-10-CM

## 2022-12-16 NOTE — Patient Instructions (Addendum)
Medication Instructions:  Your physician recommends that you continue on your current medications as directed. Please refer to the Current Medication list given to you today.  Labwork: None   Testing/Procedures: None   Follow-Up: Your physician recommends that you schedule a follow-up appointment in: Keep f/u with Dr.McDowell   Any Other Special Instructions Will Be Listed Below (If Applicable).  If you need a refill on your cardiac medications before your next appointment, please call your pharmacy.
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
## 2022-12-18 ENCOUNTER — Telehealth: Payer: Self-pay | Admitting: Nurse Practitioner

## 2022-12-18 NOTE — Telephone Encounter (Signed)
I spoke with our [employer] rep and he stated they have a full monitor report on thi patient. Since monitor was ordered by another provider patient will have to fill out a form for [place] to be able to get results. Patient informed and verbalized understanding of plan, Patient coming by the office tomorrow to sign paper to send back to [facility]

## 2022-12-18 NOTE — Telephone Encounter (Signed)
Patient states Dr.Bradley ordered the blue box monitor and he states that he wore it and has returned it to the company I am going to reach out to [place] and Dr.Brady to try again to get these results

## 2023-01-01 DIAGNOSIS — M79675 Pain in left toe(s): Secondary | ICD-10-CM | POA: Diagnosis not present

## 2023-01-01 DIAGNOSIS — M79674 Pain in right toe(s): Secondary | ICD-10-CM | POA: Diagnosis not present

## 2023-01-01 DIAGNOSIS — I739 Peripheral vascular disease, unspecified: Secondary | ICD-10-CM | POA: Diagnosis not present

## 2023-01-01 DIAGNOSIS — M79671 Pain in right foot: Secondary | ICD-10-CM | POA: Diagnosis not present

## 2023-01-01 DIAGNOSIS — M79672 Pain in left foot: Secondary | ICD-10-CM | POA: Diagnosis not present

## 2023-01-15 ENCOUNTER — Other Ambulatory Visit: Payer: Self-pay | Admitting: Cardiology

## 2023-02-09 ENCOUNTER — Telehealth: Payer: Self-pay | Admitting: Cardiology

## 2023-02-09 NOTE — Telephone Encounter (Signed)
-----   Message from Almarie Crate sent at 02/06/2023  1:20 PM EST ----- Please call and update patient regarding his monitor. Monitor revealed predominant sinus rhythm with average heart rate 57 bpm, 28 SVT runs (fast, abnormal rhythm from upper chambers of heart), run with the fastest interval lasted 6 beats with a maximum rate of 169, longest episode lasted 20 beats with an average heart rate of 114 bpm. Monitor does not give reason for syncopal episode. Recommend to continue current medication regimen and follow-up as scheduled.   Thanks!   Best,  Almarie Crate, NP

## 2023-02-09 NOTE — Telephone Encounter (Signed)
 Patient daughter informed and verbalized understanding of plan.

## 2023-02-11 ENCOUNTER — Encounter: Payer: Medicare Other | Admitting: Hematology

## 2023-02-11 ENCOUNTER — Other Ambulatory Visit: Payer: Medicare Other

## 2023-02-13 ENCOUNTER — Inpatient Hospital Stay: Payer: Medicare Other | Admitting: Oncology

## 2023-02-13 ENCOUNTER — Inpatient Hospital Stay: Payer: Medicare Other

## 2023-02-23 ENCOUNTER — Inpatient Hospital Stay: Payer: Medicare Other | Attending: Oncology | Admitting: Oncology

## 2023-02-23 ENCOUNTER — Inpatient Hospital Stay: Payer: Medicare Other

## 2023-02-23 VITALS — BP 139/57 | HR 59 | Temp 97.6°F | Resp 16 | Ht 66.0 in | Wt 204.2 lb

## 2023-02-23 DIAGNOSIS — J449 Chronic obstructive pulmonary disease, unspecified: Secondary | ICD-10-CM | POA: Diagnosis not present

## 2023-02-23 DIAGNOSIS — Z87891 Personal history of nicotine dependence: Secondary | ICD-10-CM | POA: Insufficient documentation

## 2023-02-23 DIAGNOSIS — Z79899 Other long term (current) drug therapy: Secondary | ICD-10-CM | POA: Diagnosis not present

## 2023-02-23 DIAGNOSIS — Z9981 Dependence on supplemental oxygen: Secondary | ICD-10-CM | POA: Diagnosis not present

## 2023-02-23 DIAGNOSIS — D631 Anemia in chronic kidney disease: Secondary | ICD-10-CM | POA: Diagnosis not present

## 2023-02-23 DIAGNOSIS — D638 Anemia in other chronic diseases classified elsewhere: Secondary | ICD-10-CM

## 2023-02-23 DIAGNOSIS — N184 Chronic kidney disease, stage 4 (severe): Secondary | ICD-10-CM | POA: Insufficient documentation

## 2023-02-23 DIAGNOSIS — Z862 Personal history of diseases of the blood and blood-forming organs and certain disorders involving the immune mechanism: Secondary | ICD-10-CM | POA: Insufficient documentation

## 2023-02-23 DIAGNOSIS — N183 Anemia in chronic kidney disease: Secondary | ICD-10-CM

## 2023-02-23 LAB — COMPREHENSIVE METABOLIC PANEL
ALT: 21 U/L (ref 0–44)
AST: 23 U/L (ref 15–41)
Albumin: 3.5 g/dL (ref 3.5–5.0)
Alkaline Phosphatase: 60 U/L (ref 38–126)
Anion gap: 7 (ref 5–15)
BUN: 49 mg/dL — ABNORMAL HIGH (ref 8–23)
CO2: 28 mmol/L (ref 22–32)
Calcium: 9.1 mg/dL (ref 8.9–10.3)
Chloride: 103 mmol/L (ref 98–111)
Creatinine, Ser: 2.07 mg/dL — ABNORMAL HIGH (ref 0.61–1.24)
GFR, Estimated: 30 mL/min — ABNORMAL LOW (ref >60)
Glucose, Bld: 111 mg/dL — ABNORMAL HIGH (ref 70–99)
Potassium: 4.1 mmol/L (ref 3.5–5.1)
Sodium: 138 mmol/L (ref 135–145)
Total Bilirubin: 1.1 mg/dL (ref 0.0–1.2)
Total Protein: 6.8 g/dL (ref 6.5–8.1)

## 2023-02-23 LAB — CBC WITH DIFFERENTIAL/PLATELET
Abs Immature Granulocytes: 0.02 10*3/uL (ref 0.00–0.07)
Basophils Absolute: 0 10*3/uL (ref 0.0–0.1)
Basophils Relative: 0 %
Eosinophils Absolute: 0.3 10*3/uL (ref 0.0–0.5)
Eosinophils Relative: 4 %
HCT: 33.1 % — ABNORMAL LOW (ref 39.0–52.0)
Hemoglobin: 10.2 g/dL — ABNORMAL LOW (ref 13.0–17.0)
Immature Granulocytes: 0 %
Lymphocytes Relative: 27 %
Lymphs Abs: 2.2 10*3/uL (ref 0.7–4.0)
MCH: 30.7 pg (ref 26.0–34.0)
MCHC: 30.8 g/dL (ref 30.0–36.0)
MCV: 99.7 fL (ref 80.0–100.0)
Monocytes Absolute: 0.6 10*3/uL (ref 0.1–1.0)
Monocytes Relative: 8 %
Neutro Abs: 5.1 10*3/uL (ref 1.7–7.7)
Neutrophils Relative %: 61 %
Platelets: 189 10*3/uL (ref 150–400)
RBC: 3.32 MIL/uL — ABNORMAL LOW (ref 4.22–5.81)
RDW: 13 % (ref 11.5–15.5)
WBC: 8.3 10*3/uL (ref 4.0–10.5)
nRBC: 0 % (ref 0.0–0.2)

## 2023-02-23 LAB — IRON AND TIBC
Iron: 78 ug/dL (ref 45–182)
Saturation Ratios: 27 % (ref 17.9–39.5)
TIBC: 293 ug/dL (ref 250–450)
UIBC: 215 ug/dL

## 2023-02-23 LAB — VITAMIN B12: Vitamin B-12: 420 pg/mL (ref 180–914)

## 2023-02-23 LAB — FERRITIN: Ferritin: 29 ng/mL (ref 24–336)

## 2023-02-23 LAB — FOLATE: Folate: 11.1 ng/mL (ref >5.9)

## 2023-02-23 NOTE — Assessment & Plan Note (Signed)
Follows with Dr. Wolfgang Phoenix.  No indication for EPO shots at this time.

## 2023-02-23 NOTE — Progress Notes (Signed)
[facility] at [facility] HEMATOLOGY NEW VISIT  Vyas, Angelina Pih, MD  REASON FOR REFERRAL: Anemia of chronic kidney disease  HISTORY OF PRESENT ILLNESS: Kevin [name] 89 y.o. male referred for anemia of chronic kidney disease by his nephrologist Dr. Wolfgang Phoenix.  He is accompanied by his daughter today.The patient denies experiencing fatigue and reports feeling well overall. He denies any signs of gastrointestinal bleeding such as blood in stools or dark colored stools. The patient is currently on oxygen therapy for COPD.   The patient lives alone and is able to manage his daily activities independently. He denies any alcohol consumption and quit smoking several years ago.   I have reviewed the past medical history, past surgical history, social history and family history with the patient   ALLERGIES:  has no known allergies.  MEDICATIONS:  Current Outpatient Medications  Medication Sig Dispense Refill   acetaminophen (TYLENOL) 500 MG tablet Take 500 mg by mouth as needed for mild pain or fever.     amLODipine (NORVASC) 10 MG tablet Take 10 mg by mouth daily.     aspirin EC 81 MG tablet Take 81 mg by mouth daily.     atorvastatin (LIPITOR) 80 MG tablet Take 1 tablet (80 mg total) by mouth every evening. 90 tablet 3   brimonidine (ALPHAGAN) 0.2 % ophthalmic solution INSTILL ONE DROP IN THE LEFT EYE TWICE DAILY     calcitRIOL (ROCALTROL) 0.25 MCG capsule Take 0.25 mcg by mouth 3 (three) times a week.     Cholecalciferol (VITAMIN D-3) 1000 UNITS CAPS Take 1,000 Units by mouth daily.      dorzolamide-timolol (COSOPT) 22.3-6.8 MG/ML ophthalmic solution PLACE ONE DROP INTO THE LEFT EYE TWICE DAILY     furosemide (LASIX) 20 MG tablet Take 40 mg by mouth 2 (two) times daily.     guaiFENesin (MUCINEX) 600 MG 12 hr tablet Take 600 mg by mouth daily.     hydrALAZINE (APRESOLINE) 100 MG tablet Take 0.5 tablets by mouth 3 (three) times daily.     iron polysaccharides (NIFEREX)  150 MG capsule Take 150 mg by mouth daily.     isosorbide mononitrate (IMDUR) 60 MG 24 hr tablet Take 1 tablet (60 mg total) by mouth daily. 90 tablet 3   latanoprost (XALATAN) 0.005 % ophthalmic solution Place 1 drop into both eyes at bedtime.     loratadine (CLARITIN) 10 MG tablet Take 10 mg by mouth daily.  2   metoprolol tartrate (LOPRESSOR) 25 MG tablet TAKE 1/2 TABLET BY MOUTH TWICE DAILY. 90 tablet 1   nitroGLYCERIN (NITROSTAT) 0.4 MG SL tablet Place 1 tablet (0.4 mg total) under the tongue every 5 (five) minutes x 3 doses as needed for chest pain (if no relief after 2 nd dose, proceed to the ED or call 911). 75 tablet 1   OXYGEN Inhale 1-2 L into the lungs continuous.     sodium bicarbonate 650 MG tablet Take 1 tablet (650 mg total) by mouth 2 (two) times daily. 60 tablet 0   No current facility-administered medications for this visit.     REVIEW OF SYSTEMS:   Constitutional: Denies fevers, chills or night sweats Eyes: Denies blurriness of vision Ears, nose, mouth, throat, and face: Denies mucositis or sore throat Respiratory: Denies cough, dyspnea or wheezes Cardiovascular: Denies palpitation, chest discomfort or lower extremity swelling Gastrointestinal:  Denies nausea, heartburn or change in bowel habits Skin: Denies abnormal skin rashes Lymphatics: Denies new lymphadenopathy or easy  bruising Neurological:Denies numbness, tingling or new weaknesses Behavioral/Psych: Mood is stable, no new changes  All other systems were reviewed with the patient and are negative.  PHYSICAL EXAMINATION:   Vitals:   02/23/23 1052  BP: (!) 139/57  Pulse: (!) 59  Resp: 16  Temp: 97.6 F (36.4 C)  SpO2: 98%    GENERAL:alert, no distress and comfortable, using portable oxygen LUNGS: clear to auscultation and percussion with normal breathing effort HEART: regular rate & rhythm and no murmurs and no lower extremity edema ABDOMEN:abdomen soft, non-tender and normal bowel  sounds Musculoskeletal:no cyanosis of digits and no clubbing  NEURO: alert & oriented x 3 with fluent speech  LABORATORY DATA:  I have reviewed the data as listed in labs from nephrology office from 12/04/2022 CBC: WBC: 9, hemoglobin: 9.3, hematocrit: 30.2, MCV: 101.7, platelets: 141 CMP: Potassium: 4.6, creatinine: 2.78, GFR: 21, LFTs: Normal  Lab Results  Component Value Date   WBC 8.3 02/23/2023   NEUTROABS 5.1 02/23/2023   HGB 10.2 (L) 02/23/2023   HCT 33.1 (L) 02/23/2023   MCV 99.7 02/23/2023   PLT 189 02/23/2023      Component Value Date/Time   NA 138 02/23/2023 1120   K 4.1 02/23/2023 1120   CL 103 02/23/2023 1120   CO2 28 02/23/2023 1120   GLUCOSE 111 (H) 02/23/2023 1120   BUN 49 (H) 02/23/2023 1120   CREATININE 2.07 (H) 02/23/2023 1120   CALCIUM 9.1 02/23/2023 1120   PROT 6.8 02/23/2023 1120   ALBUMIN 3.5 02/23/2023 1120   AST 23 02/23/2023 1120   ALT 21 02/23/2023 1120   ALKPHOS 60 02/23/2023 1120   BILITOT 1.1 02/23/2023 1120   GFRNONAA 30 (L) 02/23/2023 1120   GFRAA 37 (L) 12/11/2015 1303       Chemistry      Component Value Date/Time   NA 138 02/23/2023 1120   K 4.1 02/23/2023 1120   CL 103 02/23/2023 1120   CO2 28 02/23/2023 1120   BUN 49 (H) 02/23/2023 1120   CREATININE 2.07 (H) 02/23/2023 1120      Component Value Date/Time   CALCIUM 9.1 02/23/2023 1120   ALKPHOS 60 02/23/2023 1120   AST 23 02/23/2023 1120   ALT 21 02/23/2023 1120   BILITOT 1.1 02/23/2023 1120      Latest Reference Range & Units 02/08/12 09:05 12/04/15 23:23 12/05/15 02:39 12/05/15 05:30 12/05/15 10:11 12/06/15 05:01 12/08/15 03:53 02/23/23 11:19 02/23/23 11:20  Iron 45 - 182 ug/dL 17 (L)       78   UIBC ug/dL 147       829   TIBC 562 - 450 ug/dL 130       865   Saturation Ratios 17.9 - 39.5 % 7 (L)       27   Ferritin 24 - 336 ng/mL        29   Folate >5.9 ng/mL         11.1  Lactic Acid, Venous 0.5 - 1.9 mmol/L  1.9 3.0 (HH) 1.5 1.7      Procalcitonin ng/mL  >175.00  (C)    >175.00 93.47    Vitamin B12 180 - 914 pg/mL        420   (HH): Data is critically high (L): Data is abnormally low (C): Corrected Ca  ASSESSMENT & PLAN:  Patient is a 89 year old man with chronic kidney disease referred for anemia   Anemia of chronic disease No symptoms of fatigue or blood loss.  Currently on iron supplementation. Discussed the role of erythropoietin in red blood cell production and the potential need for exogenous erythropoietin injections if hemoglobin is less than 10. -Order labs to check iron levels, vitamin B12 levels, and other potential causes of anemia. -If no deficiencies are identified and hemoglobin is less than 10, consider starting erythropoietin injections every two weeks in the infusion center. -Review current iron supplementation and consider changing to a different formulation if necessary after lab results. -Return to clinic in 3 months with repeat labs  Addendum: Labs consistent with hemoglobin greater than 10 and no nutritional deficiencies.  Will monitor for now.  CKD (chronic kidney disease) stage 4, GFR 15-29 ml/min (HCC) Follows with Dr. Wolfgang Phoenix.  No indication for EPO shots at this time.   Orders Placed This Encounter  Procedures   Ferritin    Standing Status:   Future    Number of Occurrences:   1    Expected Date:   02/23/2023    Expiration Date:   02/23/2024   Folate    Standing Status:   Future    Number of Occurrences:   1    Expected Date:   02/23/2023    Expiration Date:   02/23/2024   Vitamin B12    Standing Status:   Future    Number of Occurrences:   1    Expected Date:   02/23/2023    Expiration Date:   02/23/2024   CBC with Differential/Platelet    Standing Status:   Future    Number of Occurrences:   1    Expected Date:   02/23/2023    Expiration Date:   02/23/2024   Comprehensive metabolic panel    Standing Status:   Future    Number of Occurrences:   1    Expected Date:   02/23/2023    Expiration Date:   02/23/2024    Kappa/lambda light chains    Standing Status:   Future    Number of Occurrences:   1    Expected Date:   02/23/2023    Expiration Date:   02/23/2024   Multiple Myeloma Panel (SPEP&IFE w/QIG)    Standing Status:   Future    Number of Occurrences:   1    Expected Date:   02/23/2023    Expiration Date:   02/23/2024   Iron and TIBC    Standing Status:   Future    Number of Occurrences:   1    Expected Date:   02/23/2023    Expiration Date:   02/23/2024    Release to patient:   Immediate [1]   CBC with Differential/Platelet    Standing Status:   Future    Expected Date:   05/25/2023    Expiration Date:   02/23/2024   Comprehensive metabolic panel    Standing Status:   Future    Expected Date:   05/25/2023    Expiration Date:   02/23/2024   Ferritin    Standing Status:   Future    Expected Date:   05/25/2023    Expiration Date:   02/23/2024   Iron and TIBC    Standing Status:   Future    Expected Date:   05/25/2023    Expiration Date:   02/23/2024    Release to patient:   Immediate [1]    The total time spent in the appointment was 40 minutes encounter with patients including review of chart and various tests results,  discussions about plan of care and coordination of care plan   All questions were answered. The patient knows to call the clinic with any problems, questions or concerns. No barriers to learning was detected.   Cindie Crumbly, MD 1/27/20253:49 PM

## 2023-02-23 NOTE — Assessment & Plan Note (Addendum)
No symptoms of fatigue or blood loss. Currently on iron supplementation. Discussed the role of erythropoietin in red blood cell production and the potential need for exogenous erythropoietin injections if hemoglobin is less than 10. -Order labs to check iron levels, vitamin B12 levels, and other potential causes of anemia. -If no deficiencies are identified and hemoglobin is less than 10, consider starting erythropoietin injections every two weeks in the infusion center. -Review current iron supplementation and consider changing to a different formulation if necessary after lab results. -Return to clinic in 3 months with repeat labs  Addendum: Labs consistent with hemoglobin greater than 10 and no nutritional deficiencies.  Will monitor for now.

## 2023-02-23 NOTE — Patient Instructions (Signed)
VISIT SUMMARY:  You were referred by your nephrologist due to anemia, which is a condition where you have a lower than normal number of red blood cells. You reported feeling well overall and did not have any symptoms of fatigue or blood loss. We discussed your current iron supplementation and the potential need for additional treatment. We also reviewed your COPD management, and you are continuing with your current oxygen therapy.  YOUR PLAN:  -ANEMIA SECONDARY TO CHRONIC KIDNEY DISEASE: Anemia is a condition where you have a lower than normal number of red blood cells, which can be caused by chronic kidney disease. We will check your iron levels, vitamin B12 levels, and other potential causes of anemia. Depending on the results, we may start erythropoietin injections every two weeks to help increase your red blood cell count. We will also review your current iron supplementation and may change it if necessary.  INSTRUCTIONS:  Follow up in 3 months. If erythropoietin injections are needed, our office will call you to schedule the appointments.

## 2023-02-25 DIAGNOSIS — D631 Anemia in chronic kidney disease: Secondary | ICD-10-CM | POA: Diagnosis not present

## 2023-02-25 DIAGNOSIS — N189 Chronic kidney disease, unspecified: Secondary | ICD-10-CM | POA: Diagnosis not present

## 2023-02-25 DIAGNOSIS — R809 Proteinuria, unspecified: Secondary | ICD-10-CM | POA: Diagnosis not present

## 2023-02-25 DIAGNOSIS — N184 Chronic kidney disease, stage 4 (severe): Secondary | ICD-10-CM | POA: Diagnosis not present

## 2023-03-02 ENCOUNTER — Institutional Professional Consult (permissible substitution) (HOSPITAL_BASED_OUTPATIENT_CLINIC_OR_DEPARTMENT_OTHER): Payer: Medicare Other | Admitting: Pulmonary Disease

## 2023-03-02 LAB — MULTIPLE MYELOMA PANEL, SERUM
Albumin SerPl Elph-Mcnc: 3.3 g/dL (ref 2.9–4.4)
Albumin/Glob SerPl: 1.2 (ref 0.7–1.7)
Alpha 1: 0.3 g/dL (ref 0.0–0.4)
Alpha2 Glob SerPl Elph-Mcnc: 0.7 g/dL (ref 0.4–1.0)
B-Globulin SerPl Elph-Mcnc: 0.9 g/dL (ref 0.7–1.3)
Gamma Glob SerPl Elph-Mcnc: 0.9 g/dL (ref 0.4–1.8)
Globulin, Total: 2.9 g/dL (ref 2.2–3.9)
IgA: 230 mg/dL (ref 61–437)
IgG (Immunoglobin G), Serum: 983 mg/dL (ref 603–1613)
IgM (Immunoglobulin M), Srm: 61 mg/dL (ref 15–143)
Total Protein ELP: 6.2 g/dL (ref 6.0–8.5)

## 2023-03-02 LAB — KAPPA/LAMBDA LIGHT CHAINS
Kappa free light chain: 78.9 mg/L — ABNORMAL HIGH (ref 3.3–19.4)
Kappa, lambda light chain ratio: 1.76 — ABNORMAL HIGH (ref 0.26–1.65)
Lambda free light chains: 44.8 mg/L — ABNORMAL HIGH (ref 5.7–26.3)

## 2023-03-04 DIAGNOSIS — N2581 Secondary hyperparathyroidism of renal origin: Secondary | ICD-10-CM | POA: Diagnosis not present

## 2023-03-04 DIAGNOSIS — I5032 Chronic diastolic (congestive) heart failure: Secondary | ICD-10-CM | POA: Diagnosis not present

## 2023-03-04 DIAGNOSIS — R809 Proteinuria, unspecified: Secondary | ICD-10-CM | POA: Diagnosis not present

## 2023-03-04 DIAGNOSIS — I129 Hypertensive chronic kidney disease with stage 1 through stage 4 chronic kidney disease, or unspecified chronic kidney disease: Secondary | ICD-10-CM | POA: Diagnosis not present

## 2023-03-09 ENCOUNTER — Ambulatory Visit: Payer: Medicare Other | Attending: Cardiology | Admitting: Cardiology

## 2023-03-09 ENCOUNTER — Encounter: Payer: Self-pay | Admitting: Cardiology

## 2023-03-09 VITALS — BP 126/68 | HR 56 | Ht 66.0 in | Wt 203.6 lb

## 2023-03-09 DIAGNOSIS — I10 Essential (primary) hypertension: Secondary | ICD-10-CM | POA: Diagnosis not present

## 2023-03-09 DIAGNOSIS — E782 Mixed hyperlipidemia: Secondary | ICD-10-CM | POA: Diagnosis not present

## 2023-03-09 DIAGNOSIS — I25119 Atherosclerotic heart disease of native coronary artery with unspecified angina pectoris: Secondary | ICD-10-CM

## 2023-03-09 DIAGNOSIS — I5032 Chronic diastolic (congestive) heart failure: Secondary | ICD-10-CM

## 2023-03-09 NOTE — Patient Instructions (Addendum)

## 2023-03-09 NOTE — Progress Notes (Signed)
    Cardiology Office Note  Date: 03/09/2023   ID: Kevin [name], DOB December 04, 1930, MRN [id number]  History of Present Illness: Kevin [name] is a 89 y.o. male last seen in October 2024.  He is here today with his daughter for a follow-up visit.  Overall relatively stable since last encounter, no interval hospitalizations.  He is using oxygen  supplementation regularly, weight has been stable, and current diuretic regimen has been effective in terms of leg swelling.  I reviewed his medications.  Current regimen includes aspirin , Norvasc , Lipitor , Lasix , hydralazine , Imdur , Lopressor , and as needed nitroglycerin .  Blood pressure today is well-controlled.  I did review his interval lab work, creatinine down to 2.07, his last LDL was 47.  Physical Exam: VS:  BP 126/68   Pulse (!) 56   Ht 5\' 6"  (1.676 m)   Wt 203 lb 9.6 oz (92.4 kg)   SpO2 91%   BMI 32.86 kg/m , BMI Body mass index is 32.86 kg/m.  Wt Readings from Last 3 Encounters:  03/09/23 203 lb 9.6 oz (92.4 kg)  02/23/23 204 lb 3.2 oz (92.6 kg)  12/16/22 204 lb (92.5 kg)    General: Patient appears comfortable at rest. HEENT: Conjunctiva and lids normal. Neck: Supple, no elevated JVP or carotid bruits. Lungs: Clear to auscultation, nonlabored breathing at rest. Cardiac: Regular rate and rhythm, no S3, 1/6 systolic murmur. Extremities: Improved ankle edema.  ECG:  An ECG dated 09/01/2022 was personally reviewed today and demonstrated:  Sinus bradycardia with short PR interval and nonspecific ST changes.  Labwork: March 2024: Cholesterol 106, July strides 94, HDL 41, LDL 47 02/23/2023: ALT 21; AST 23; BUN 49; Creatinine, Ser 2.07; Hemoglobin 10.2; Platelets 189; Potassium 4.1; Sodium 138   Other Studies Reviewed Today:  No interval cardiac testing for review today.  Assessment and Plan:  1.  Multivessel CAD status post DES to the LAD in 2014 with known occlusion of the mid to distal RCA associated with left-to-right collaterals  and medically managed circumflex stenosis.  Last ischemic testing was in 2022, low risk Lexiscan  Myoview .  He does not report any angina or nitroglycerin  use.  Continue aspirin , Imdur  and Lipitor .  2.  Mixed hyperlipidemia.  Last LDL 47 on Lipitor .   3.  Primary hypertension.  Blood pressure is well-controlled today.  He is on Norvasc , hydralazine , and Lopressor .   4.  HFpEF, LVEF 60 to 65% with moderate mitral regurgitation, moderate tricuspid regurgitation, and severely elevated estimated RVSP by echocardiogram in August 2024 at [facility].  Tolerating current diuretic regimen with stable weight.   5.  CKD stage 3B-4, recent creatinine 2.07 and GFR 30.  To follow with Dr. Carrolyn Clan.  Disposition:  Follow up  6 months.  Signed, Gerard Knight, M.D., F.A.C.C. [facility]

## 2023-03-12 DIAGNOSIS — M79671 Pain in right foot: Secondary | ICD-10-CM | POA: Diagnosis not present

## 2023-03-12 DIAGNOSIS — M79674 Pain in right toe(s): Secondary | ICD-10-CM | POA: Diagnosis not present

## 2023-03-12 DIAGNOSIS — M79675 Pain in left toe(s): Secondary | ICD-10-CM | POA: Diagnosis not present

## 2023-03-12 DIAGNOSIS — M79672 Pain in left foot: Secondary | ICD-10-CM | POA: Diagnosis not present

## 2023-03-12 DIAGNOSIS — I739 Peripheral vascular disease, unspecified: Secondary | ICD-10-CM | POA: Diagnosis not present

## 2023-03-13 DIAGNOSIS — Z299 Encounter for prophylactic measures, unspecified: Secondary | ICD-10-CM | POA: Diagnosis not present

## 2023-03-13 DIAGNOSIS — I10 Essential (primary) hypertension: Secondary | ICD-10-CM | POA: Diagnosis not present

## 2023-03-13 DIAGNOSIS — N184 Chronic kidney disease, stage 4 (severe): Secondary | ICD-10-CM | POA: Diagnosis not present

## 2023-03-13 DIAGNOSIS — I5032 Chronic diastolic (congestive) heart failure: Secondary | ICD-10-CM | POA: Diagnosis not present

## 2023-03-13 DIAGNOSIS — J9611 Chronic respiratory failure with hypoxia: Secondary | ICD-10-CM | POA: Diagnosis not present

## 2023-03-13 DIAGNOSIS — I700 Atherosclerosis of aorta: Secondary | ICD-10-CM | POA: Diagnosis not present

## 2023-03-17 DIAGNOSIS — H401123 Primary open-angle glaucoma, left eye, severe stage: Secondary | ICD-10-CM | POA: Diagnosis not present

## 2023-04-17 ENCOUNTER — Other Ambulatory Visit: Payer: Self-pay | Admitting: Cardiology

## 2023-04-27 DIAGNOSIS — I5032 Chronic diastolic (congestive) heart failure: Secondary | ICD-10-CM | POA: Diagnosis not present

## 2023-04-27 DIAGNOSIS — Z0000 Encounter for general adult medical examination without abnormal findings: Secondary | ICD-10-CM | POA: Diagnosis not present

## 2023-04-27 DIAGNOSIS — I10 Essential (primary) hypertension: Secondary | ICD-10-CM | POA: Diagnosis not present

## 2023-04-27 DIAGNOSIS — Z713 Dietary counseling and surveillance: Secondary | ICD-10-CM | POA: Diagnosis not present

## 2023-04-27 DIAGNOSIS — Z299 Encounter for prophylactic measures, unspecified: Secondary | ICD-10-CM | POA: Diagnosis not present

## 2023-04-27 DIAGNOSIS — I25119 Atherosclerotic heart disease of native coronary artery with unspecified angina pectoris: Secondary | ICD-10-CM | POA: Diagnosis not present

## 2023-04-27 DIAGNOSIS — I700 Atherosclerosis of aorta: Secondary | ICD-10-CM | POA: Diagnosis not present

## 2023-04-27 DIAGNOSIS — Z7189 Other specified counseling: Secondary | ICD-10-CM | POA: Diagnosis not present

## 2023-05-18 ENCOUNTER — Inpatient Hospital Stay: Payer: Medicare Other | Attending: Oncology

## 2023-05-18 DIAGNOSIS — N184 Chronic kidney disease, stage 4 (severe): Secondary | ICD-10-CM | POA: Insufficient documentation

## 2023-05-18 DIAGNOSIS — D631 Anemia in chronic kidney disease: Secondary | ICD-10-CM | POA: Insufficient documentation

## 2023-05-18 LAB — CBC WITH DIFFERENTIAL/PLATELET
Abs Immature Granulocytes: 0.02 10*3/uL (ref 0.00–0.07)
Basophils Absolute: 0 10*3/uL (ref 0.0–0.1)
Basophils Relative: 0 %
Eosinophils Absolute: 0.2 10*3/uL (ref 0.0–0.5)
Eosinophils Relative: 3 %
HCT: 31.5 % — ABNORMAL LOW (ref 39.0–52.0)
Hemoglobin: 9.7 g/dL — ABNORMAL LOW (ref 13.0–17.0)
Immature Granulocytes: 0 %
Lymphocytes Relative: 25 %
Lymphs Abs: 1.9 10*3/uL (ref 0.7–4.0)
MCH: 30.8 pg (ref 26.0–34.0)
MCHC: 30.8 g/dL (ref 30.0–36.0)
MCV: 100 fL (ref 80.0–100.0)
Monocytes Absolute: 0.7 10*3/uL (ref 0.1–1.0)
Monocytes Relative: 9 %
Neutro Abs: 4.9 10*3/uL (ref 1.7–7.7)
Neutrophils Relative %: 63 %
Platelets: 132 10*3/uL — ABNORMAL LOW (ref 150–400)
RBC: 3.15 MIL/uL — ABNORMAL LOW (ref 4.22–5.81)
RDW: 14.2 % (ref 11.5–15.5)
WBC: 7.7 10*3/uL (ref 4.0–10.5)
nRBC: 0 % (ref 0.0–0.2)

## 2023-05-18 LAB — COMPREHENSIVE METABOLIC PANEL WITH GFR
ALT: 28 U/L (ref 0–44)
AST: 26 U/L (ref 15–41)
Albumin: 3.5 g/dL (ref 3.5–5.0)
Alkaline Phosphatase: 63 U/L (ref 38–126)
Anion gap: 10 (ref 5–15)
BUN: 75 mg/dL — ABNORMAL HIGH (ref 8–23)
CO2: 24 mmol/L (ref 22–32)
Calcium: 8.5 mg/dL — ABNORMAL LOW (ref 8.9–10.3)
Chloride: 105 mmol/L (ref 98–111)
Creatinine, Ser: 2.71 mg/dL — ABNORMAL HIGH (ref 0.61–1.24)
GFR, Estimated: 22 mL/min — ABNORMAL LOW (ref >60)
Glucose, Bld: 112 mg/dL — ABNORMAL HIGH (ref 70–99)
Potassium: 4.2 mmol/L (ref 3.5–5.1)
Sodium: 139 mmol/L (ref 135–145)
Total Bilirubin: 0.8 mg/dL (ref 0.0–1.2)
Total Protein: 6.4 g/dL — ABNORMAL LOW (ref 6.5–8.1)

## 2023-05-18 LAB — IRON AND TIBC
Iron: 79 ug/dL (ref 45–182)
Saturation Ratios: 26 % (ref 17.9–39.5)
TIBC: 305 ug/dL (ref 250–450)
UIBC: 226 ug/dL

## 2023-05-18 LAB — FERRITIN: Ferritin: 21 ng/mL — ABNORMAL LOW (ref 24–336)

## 2023-05-21 DIAGNOSIS — M79671 Pain in right foot: Secondary | ICD-10-CM | POA: Diagnosis not present

## 2023-05-21 DIAGNOSIS — I739 Peripheral vascular disease, unspecified: Secondary | ICD-10-CM | POA: Diagnosis not present

## 2023-05-21 DIAGNOSIS — M79675 Pain in left toe(s): Secondary | ICD-10-CM | POA: Diagnosis not present

## 2023-05-21 DIAGNOSIS — M79672 Pain in left foot: Secondary | ICD-10-CM | POA: Diagnosis not present

## 2023-05-21 DIAGNOSIS — M79674 Pain in right toe(s): Secondary | ICD-10-CM | POA: Diagnosis not present

## 2023-05-25 ENCOUNTER — Inpatient Hospital Stay: Payer: Medicare Other | Admitting: Oncology

## 2023-06-01 ENCOUNTER — Inpatient Hospital Stay: Attending: Oncology | Admitting: Oncology

## 2023-06-01 VITALS — BP 136/48 | HR 50 | Temp 97.7°F | Resp 18 | Wt 214.9 lb

## 2023-06-01 DIAGNOSIS — D631 Anemia in chronic kidney disease: Secondary | ICD-10-CM | POA: Diagnosis not present

## 2023-06-01 DIAGNOSIS — R768 Other specified abnormal immunological findings in serum: Secondary | ICD-10-CM

## 2023-06-01 DIAGNOSIS — E611 Iron deficiency: Secondary | ICD-10-CM | POA: Insufficient documentation

## 2023-06-01 DIAGNOSIS — N184 Chronic kidney disease, stage 4 (severe): Secondary | ICD-10-CM | POA: Diagnosis not present

## 2023-06-01 DIAGNOSIS — D638 Anemia in other chronic diseases classified elsewhere: Secondary | ICD-10-CM

## 2023-06-01 NOTE — Patient Instructions (Signed)
 VISIT SUMMARY:  Today, you were seen for an evaluation of your anemia, which is likely related to your chronic kidney disease. Your recent blood work showed that your hemoglobin and iron  levels are lower than expected. We discussed your history of iron  infusions and your current iron  pill regimen.  YOUR PLAN:  -IRON  DEFICIENCY ANEMIA: Iron  deficiency anemia means that your body does not have enough iron  to produce the hemoglobin needed for red blood cells. This is likely due to your chronic kidney disease. We will administer two doses of IV iron , one week apart, to help increase your iron  levels. You should continue taking your oral iron  supplements every other day.  -CHRONIC KIDNEY DISEASE WITH ANEMIA: Chronic kidney disease with anemia means that your kidneys are not functioning well, which affects your red blood cell count. Your hemoglobin level is below 10, and our priority is to correct your iron  deficiency first. We will reassess your hemoglobin levels in six weeks after the iron  infusions. If your hemoglobin remains low, we may consider additional treatments to stimulate red blood cell production.  INSTRUCTIONS:  Please return in six weeks for a follow-up blood test to reassess your hemoglobin levels. Continue taking your oral iron  supplements every other day and attend your scheduled IV iron  infusion appointments.

## 2023-06-01 NOTE — Progress Notes (Unsigned)
 [facility] at [facility]  HEMATOLOGY FOLLOW-UP VISIT  Kevin Bitters, MD  REASON FOR FOLLOW-UP: Anemia of chronic kidney disease  ASSESSMENT & PLAN:  Patient is a 89 y.o. male following for anemia of chronic kidney disease    Anemia of chronic disease Patient likely has anemia of chronic kidney disease along with a component of iron  deficiency. Current TSAT 26 with a ferritin of 21. SPEP: Negative, IFE: Normal Slight elevated free light chains with slightly elevated FLC ratio but less than 100  - Will consider doing iron  infusions to a TSAT goal of 35 as long as ferritin is less than 400.  Discussed adverse effects of iron  infusion including allergic reactions, nausea and headaches.  Patient is in agreement to proceed with iron  infusions -Decrease oral iron  to every other day.  Take MiraLAX for constipation - Will consider ESA shots if patient's hemoglobin remains less than 10 after replenishing iron   Return to clinic in 6 weeks with labs  CKD (chronic kidney disease) stage 4, GFR 15-29 ml/min (HCC) Follows with Dr. Carrolyn Clan.  No indication for EPO shots at this time.  Elevated serum immunoglobulin free light chains Likely secondary to chronic kidney disease with slightly elevated free light chain ratio SPEP: No M spike, IFE: Normal  - Continue to monitor at this time   Orders Placed This Encounter  Procedures   Ferritin    Standing Status:   Future    Expected Date:   07/13/2023    Expiration Date:   06/01/2024   Folate    Standing Status:   Future    Expected Date:   07/13/2023    Expiration Date:   06/01/2024   Vitamin B12    Standing Status:   Future    Expected Date:   07/13/2023    Expiration Date:   06/01/2024   Comprehensive metabolic panel with GFR    Standing Status:   Future    Expected Date:   07/13/2023    Expiration Date:   06/01/2024   CBC with Differential/Platelet    Standing Status:   Future    Expected Date:   07/13/2023     Expiration Date:   06/01/2024   Iron  and TIBC    Standing Status:   Future    Expected Date:   07/13/2023    Expiration Date:   06/01/2024    The total time spent in the appointment was 20 minutes encounter with patients including review of chart and various tests results, discussions about plan of care and coordination of care plan   All questions were answered. The patient knows to call the clinic with any problems, questions or concerns. No barriers to learning was detected.  Kevin Grade, MD 5/6/20251:18 PM   SUMMARY OF HEMATOLOGIC HISTORY: Anemia of chronic kidney disease - SPEP: No M spike, IFE: Normal - Slightly elevated free light chains with slightly elevated FLC ratio, less than 100   INTERVAL HISTORY: Kevin [name] 89 y.o. male following for anemia of chronic kidney disease. Patient was accompanied by his daughter today.He has a history of receiving iron  infusions approximately ten years ago. He is currently taking iron  pills every other day to manage his iron  levels.  He feels good overall, though he sometimes experiences tiredness, but not frequently.  I have reviewed the past medical history, past surgical history, social history and family history with the patient   ALLERGIES:  has no known allergies.  MEDICATIONS:  Current  Outpatient Medications  Medication Sig Dispense Refill   acetaminophen  (TYLENOL ) 500 MG tablet Take 500 mg by mouth as needed for mild pain or fever.     amLODipine  (NORVASC ) 10 MG tablet Take 10 mg by mouth daily.     aspirin  EC 81 MG tablet Take 81 mg by mouth daily.     atorvastatin  (LIPITOR ) 80 MG tablet Take 1 tablet (80 mg total) by mouth every evening. 90 tablet 3   brimonidine (ALPHAGAN) 0.2 % ophthalmic solution INSTILL ONE DROP IN THE LEFT EYE TWICE DAILY     calcitRIOL (ROCALTROL) 0.25 MCG capsule Take 0.25 mcg by mouth 3 (three) times a week.     Cholecalciferol  (VITAMIN D -3) 1000 UNITS CAPS Take 1,000 Units by mouth daily.       dorzolamide-timolol (COSOPT) 22.3-6.8 MG/ML ophthalmic solution PLACE ONE DROP INTO THE LEFT EYE TWICE DAILY     furosemide  (LASIX ) 20 MG tablet Take 40 mg by mouth 2 (two) times daily.     guaiFENesin  (MUCINEX ) 600 MG 12 hr tablet Take 600 mg by mouth daily.     hydrALAZINE  (APRESOLINE ) 100 MG tablet Take 0.5 tablets by mouth 3 (three) times daily.     iron  polysaccharides (NIFEREX) 150 MG capsule Take 150 mg by mouth daily.     isosorbide  mononitrate (IMDUR ) 60 MG 24 hr tablet Take 1 tablet (60 mg total) by mouth daily. 90 tablet 3   latanoprost (XALATAN) 0.005 % ophthalmic solution Place 1 drop into both eyes at bedtime.     loratadine (CLARITIN) 10 MG tablet Take 10 mg by mouth daily.  2   metoprolol  tartrate (LOPRESSOR ) 25 MG tablet TAKE 1/2 TABLET BY MOUTH TWICE DAILY. 90 tablet 2   nitroGLYCERIN  (NITROSTAT ) 0.4 MG SL tablet Place 1 tablet (0.4 mg total) under the tongue every 5 (five) minutes x 3 doses as needed for chest pain (if no relief after 2 nd dose, proceed to the ED or call 911). 75 tablet 1   OXYGEN  Inhale 1-2 L into the lungs continuous.     sodium bicarbonate  650 MG tablet Take 1 tablet (650 mg total) by mouth 2 (two) times daily. 60 tablet 0   No current facility-administered medications for this visit.     REVIEW OF SYSTEMS:   Constitutional: Denies fevers, chills or night sweats Eyes: Denies blurriness of vision Ears, nose, mouth, throat, and face: Denies mucositis or sore throat Respiratory: Denies cough, dyspnea or wheezes Cardiovascular: Denies palpitation, chest discomfort or lower extremity swelling Gastrointestinal:  Denies nausea, heartburn or change in bowel habits Skin: Denies abnormal skin rashes Lymphatics: Denies new lymphadenopathy or easy bruising Neurological:Denies numbness, tingling or new weaknesses Behavioral/Psych: Mood is stable, no new changes  All other systems were reviewed with the patient and are negative.  PHYSICAL  EXAMINATION:   Vitals:   06/01/23 1254  BP: (!) 136/48  Pulse: (!) 50  Resp: 18  Temp: 97.7 F (36.5 C)  SpO2: 98%    GENERAL:alert, no distress and comfortable SKIN: skin color, texture, turgor are normal, no rashes or significant lesions LUNGS: clear to auscultation and percussion with normal breathing effort HEART: regular rate & rhythm and no murmurs and no lower extremity edema ABDOMEN:abdomen soft, non-tender and normal bowel sounds Musculoskeletal:no cyanosis of digits and no clubbing  NEURO: alert & oriented x 3 with fluent speech  LABORATORY DATA:  I have reviewed the data as listed  Lab Results  Component Value Date   WBC 7.7 05/18/2023  NEUTROABS 4.9 05/18/2023   HGB 9.7 (L) 05/18/2023   HCT 31.5 (L) 05/18/2023   MCV 100.0 05/18/2023   PLT 132 (L) 05/18/2023      Chemistry      Component Value Date/Time   NA 139 05/18/2023 1317   K 4.2 05/18/2023 1317   CL 105 05/18/2023 1317   CO2 24 05/18/2023 1317   BUN 75 (H) 05/18/2023 1317   CREATININE 2.71 (H) 05/18/2023 1317      Component Value Date/Time   CALCIUM  8.5 (L) 05/18/2023 1317   ALKPHOS 63 05/18/2023 1317   AST 26 05/18/2023 1317   ALT 28 05/18/2023 1317   BILITOT 0.8 05/18/2023 1317       Latest Reference Range & Units 02/23/23 11:19 02/23/23 11:20 05/18/23 13:17  Iron  45 - 182 ug/dL 78  79  UIBC ug/dL 161  096  TIBC 045 - 409 ug/dL 811  914  Saturation Ratios 17.9 - 39.5 % 27  26  Ferritin 24 - 336 ng/mL 29  21 (L)  Folate >5.9 ng/mL  11.1   Vitamin B12 180 - 914 pg/mL 420    Total Protein ELP 6.0 - 8.5 g/dL 6.2 (C)    Albumin SerPl Elph-Mcnc 2.9 - 4.4 g/dL 3.3 (C)    Albumin/Glob SerPl 0.7 - 1.7  1.2 (C)    Alpha2 Glob SerPl Elph-Mcnc 0.4 - 1.0 g/dL 0.7 (C)    Alpha 1 0.0 - 0.4 g/dL 0.3 (C)    Gamma Glob SerPl Elph-Mcnc 0.4 - 1.8 g/dL 0.9 (C)    M Protein SerPl Elph-Mcnc Not Observed g/dL Not Observed (C)    IFE 1  Comment (C)    Globulin, Total 2.2 - 3.9 g/dL 2.9 (C)     B-Globulin SerPl Elph-Mcnc 0.7 - 1.3 g/dL 0.9 (C)    IgG (Immunoglobin G), Serum 603 - 1,613 mg/dL 782    IgM (Immunoglobulin M), Srm 15 - 143 mg/dL 61    IgA 61 - 956 mg/dL 213    (L): Data is abnormally low (C): Corrected   Latest Reference Range & Units 02/23/23 11:20  Kappa free light chain 3.3 - 19.4 mg/L 78.9 (H)  Lambda free light chains 5.7 - 26.3 mg/L 44.8 (H)  Kappa, lambda light chain ratio 0.26 - 1.65  1.76 (H)  (H): Data is abnormally high

## 2023-06-02 ENCOUNTER — Encounter: Payer: Self-pay | Admitting: Oncology

## 2023-06-02 DIAGNOSIS — R768 Other specified abnormal immunological findings in serum: Secondary | ICD-10-CM | POA: Insufficient documentation

## 2023-06-02 DIAGNOSIS — R809 Proteinuria, unspecified: Secondary | ICD-10-CM | POA: Diagnosis not present

## 2023-06-02 DIAGNOSIS — D631 Anemia in chronic kidney disease: Secondary | ICD-10-CM | POA: Diagnosis not present

## 2023-06-02 DIAGNOSIS — N189 Chronic kidney disease, unspecified: Secondary | ICD-10-CM | POA: Diagnosis not present

## 2023-06-02 DIAGNOSIS — E211 Secondary hyperparathyroidism, not elsewhere classified: Secondary | ICD-10-CM | POA: Diagnosis not present

## 2023-06-02 NOTE — Assessment & Plan Note (Signed)
 Follows with Dr. Wolfgang Phoenix.  No indication for EPO shots at this time.

## 2023-06-02 NOTE — Assessment & Plan Note (Signed)
 Likely secondary to chronic kidney disease with slightly elevated free light chain ratio SPEP: No M spike, IFE: Normal  - Continue to monitor at this time

## 2023-06-02 NOTE — Assessment & Plan Note (Addendum)
 Patient likely has anemia of chronic kidney disease along with a component of iron  deficiency. Current TSAT 26 with a ferritin of 21. SPEP: Negative, IFE: Normal Slight elevated free light chains with slightly elevated FLC ratio but less than 100  - Will consider doing iron  infusions to a TSAT goal of 35 as long as ferritin is less than 400.  Discussed adverse effects of iron  infusion including allergic reactions, nausea and headaches.  Patient is in agreement to proceed with iron  infusions -Decrease oral iron  to every other day.  Take MiraLAX for constipation - Will consider ESA shots if patient's hemoglobin remains less than 10 after replenishing iron   Return to clinic in 6 weeks with labs

## 2023-06-04 ENCOUNTER — Inpatient Hospital Stay

## 2023-06-04 VITALS — BP 132/52 | HR 54 | Temp 97.0°F | Resp 18

## 2023-06-04 DIAGNOSIS — D631 Anemia in chronic kidney disease: Secondary | ICD-10-CM | POA: Diagnosis not present

## 2023-06-04 DIAGNOSIS — N184 Chronic kidney disease, stage 4 (severe): Secondary | ICD-10-CM | POA: Diagnosis not present

## 2023-06-04 DIAGNOSIS — E611 Iron deficiency: Secondary | ICD-10-CM | POA: Diagnosis not present

## 2023-06-04 DIAGNOSIS — D638 Anemia in other chronic diseases classified elsewhere: Secondary | ICD-10-CM

## 2023-06-04 MED ORDER — SODIUM CHLORIDE 0.9 % IV SOLN: INTRAVENOUS | Status: DC

## 2023-06-04 MED ORDER — SODIUM CHLORIDE 0.9 % IV SOLN
510.0000 mg | Freq: Once | INTRAVENOUS | Status: AC
  Administered 2023-06-04: 510 mg via INTRAVENOUS
  Filled 2023-06-04: qty 510

## 2023-06-04 NOTE — Progress Notes (Signed)
 Patient tolerated iron infusion with no complaints voiced.  Peripheral IV site clean and dry with good blood return noted before and after infusion.  Band aid applied.  VSS with discharge and left in satisfactory condition with no s/s of distress noted.

## 2023-06-04 NOTE — Patient Instructions (Signed)

## 2023-06-11 ENCOUNTER — Inpatient Hospital Stay

## 2023-06-11 VITALS — BP 135/57 | HR 50 | Temp 96.8°F | Resp 20

## 2023-06-11 DIAGNOSIS — D631 Anemia in chronic kidney disease: Secondary | ICD-10-CM | POA: Diagnosis not present

## 2023-06-11 DIAGNOSIS — D638 Anemia in other chronic diseases classified elsewhere: Secondary | ICD-10-CM

## 2023-06-11 DIAGNOSIS — N184 Chronic kidney disease, stage 4 (severe): Secondary | ICD-10-CM | POA: Diagnosis not present

## 2023-06-11 DIAGNOSIS — E611 Iron deficiency: Secondary | ICD-10-CM | POA: Diagnosis not present

## 2023-06-11 MED ORDER — SODIUM CHLORIDE 0.9 % IV SOLN
510.0000 mg | Freq: Once | INTRAVENOUS | Status: AC
  Administered 2023-06-11: 510 mg via INTRAVENOUS
  Filled 2023-06-11: qty 510

## 2023-06-11 MED ORDER — SODIUM CHLORIDE 0.9 % IV SOLN: INTRAVENOUS | Status: DC

## 2023-06-11 NOTE — Patient Instructions (Signed)

## 2023-06-11 NOTE — Progress Notes (Signed)
 Patient tolerated iron infusion with no complaints voiced.  Peripheral IV site clean and dry with good blood return noted before and after infusion.  Band aid applied.  VSS with discharge and left in satisfactory condition with no s/s of distress noted.

## 2023-06-12 DIAGNOSIS — N2581 Secondary hyperparathyroidism of renal origin: Secondary | ICD-10-CM | POA: Diagnosis not present

## 2023-06-12 DIAGNOSIS — I129 Hypertensive chronic kidney disease with stage 1 through stage 4 chronic kidney disease, or unspecified chronic kidney disease: Secondary | ICD-10-CM | POA: Diagnosis not present

## 2023-06-12 DIAGNOSIS — I5032 Chronic diastolic (congestive) heart failure: Secondary | ICD-10-CM | POA: Diagnosis not present

## 2023-06-12 DIAGNOSIS — N184 Chronic kidney disease, stage 4 (severe): Secondary | ICD-10-CM | POA: Diagnosis not present

## 2023-07-06 ENCOUNTER — Inpatient Hospital Stay: Attending: Oncology

## 2023-07-06 DIAGNOSIS — D638 Anemia in other chronic diseases classified elsewhere: Secondary | ICD-10-CM

## 2023-07-06 DIAGNOSIS — Z79899 Other long term (current) drug therapy: Secondary | ICD-10-CM | POA: Insufficient documentation

## 2023-07-06 DIAGNOSIS — R768 Other specified abnormal immunological findings in serum: Secondary | ICD-10-CM | POA: Insufficient documentation

## 2023-07-06 DIAGNOSIS — N184 Chronic kidney disease, stage 4 (severe): Secondary | ICD-10-CM | POA: Diagnosis not present

## 2023-07-06 DIAGNOSIS — D631 Anemia in chronic kidney disease: Secondary | ICD-10-CM | POA: Insufficient documentation

## 2023-07-06 DIAGNOSIS — E538 Deficiency of other specified B group vitamins: Secondary | ICD-10-CM | POA: Diagnosis not present

## 2023-07-06 LAB — COMPREHENSIVE METABOLIC PANEL WITH GFR
ALT: 35 U/L (ref 0–44)
AST: 30 U/L (ref 15–41)
Albumin: 3.5 g/dL (ref 3.5–5.0)
Alkaline Phosphatase: 78 U/L (ref 38–126)
Anion gap: 9 (ref 5–15)
BUN: 48 mg/dL — ABNORMAL HIGH (ref 8–23)
CO2: 31 mmol/L (ref 22–32)
Calcium: 8.7 mg/dL — ABNORMAL LOW (ref 8.9–10.3)
Chloride: 101 mmol/L (ref 98–111)
Creatinine, Ser: 2.36 mg/dL — ABNORMAL HIGH (ref 0.61–1.24)
GFR, Estimated: 25 mL/min — ABNORMAL LOW (ref >60)
Glucose, Bld: 111 mg/dL — ABNORMAL HIGH (ref 70–99)
Potassium: 3.8 mmol/L (ref 3.5–5.1)
Sodium: 141 mmol/L (ref 135–145)
Total Bilirubin: 1 mg/dL (ref 0.0–1.2)
Total Protein: 6.4 g/dL — ABNORMAL LOW (ref 6.5–8.1)

## 2023-07-06 LAB — VITAMIN B12: Vitamin B-12: 303 pg/mL (ref 180–914)

## 2023-07-06 LAB — FOLATE: Folate: 10.5 ng/mL (ref >5.9)

## 2023-07-06 LAB — CBC WITH DIFFERENTIAL/PLATELET
Abs Immature Granulocytes: 0.02 10*3/uL (ref 0.00–0.07)
Basophils Absolute: 0 10*3/uL (ref 0.0–0.1)
Basophils Relative: 0 %
Eosinophils Absolute: 0.2 10*3/uL (ref 0.0–0.5)
Eosinophils Relative: 2 %
HCT: 34.9 % — ABNORMAL LOW (ref 39.0–52.0)
Hemoglobin: 10.6 g/dL — ABNORMAL LOW (ref 13.0–17.0)
Immature Granulocytes: 0 %
Lymphocytes Relative: 19 %
Lymphs Abs: 1.6 10*3/uL (ref 0.7–4.0)
MCH: 31 pg (ref 26.0–34.0)
MCHC: 30.4 g/dL (ref 30.0–36.0)
MCV: 102 fL — ABNORMAL HIGH (ref 80.0–100.0)
Monocytes Absolute: 0.5 10*3/uL (ref 0.1–1.0)
Monocytes Relative: 6 %
Neutro Abs: 5.9 10*3/uL (ref 1.7–7.7)
Neutrophils Relative %: 73 %
Platelets: 136 10*3/uL — ABNORMAL LOW (ref 150–400)
RBC: 3.42 MIL/uL — ABNORMAL LOW (ref 4.22–5.81)
RDW: 14.2 % (ref 11.5–15.5)
WBC: 8.2 10*3/uL (ref 4.0–10.5)
nRBC: 0 % (ref 0.0–0.2)

## 2023-07-06 LAB — IRON AND TIBC
Iron: 70 ug/dL (ref 45–182)
Saturation Ratios: 28 % (ref 17.9–39.5)
TIBC: 247 ug/dL — ABNORMAL LOW (ref 250–450)
UIBC: 177 ug/dL

## 2023-07-06 LAB — FERRITIN: Ferritin: 172 ng/mL (ref 24–336)

## 2023-07-13 ENCOUNTER — Inpatient Hospital Stay (HOSPITAL_BASED_OUTPATIENT_CLINIC_OR_DEPARTMENT_OTHER): Admitting: Oncology

## 2023-07-13 VITALS — BP 122/51 | HR 63 | Temp 98.0°F | Resp 18

## 2023-07-13 DIAGNOSIS — E538 Deficiency of other specified B group vitamins: Secondary | ICD-10-CM | POA: Insufficient documentation

## 2023-07-13 DIAGNOSIS — R768 Other specified abnormal immunological findings in serum: Secondary | ICD-10-CM | POA: Diagnosis not present

## 2023-07-13 DIAGNOSIS — N184 Chronic kidney disease, stage 4 (severe): Secondary | ICD-10-CM

## 2023-07-13 DIAGNOSIS — D638 Anemia in other chronic diseases classified elsewhere: Secondary | ICD-10-CM | POA: Diagnosis not present

## 2023-07-13 DIAGNOSIS — D631 Anemia in chronic kidney disease: Secondary | ICD-10-CM | POA: Diagnosis not present

## 2023-07-13 DIAGNOSIS — Z79899 Other long term (current) drug therapy: Secondary | ICD-10-CM | POA: Diagnosis not present

## 2023-07-13 MED ORDER — VITAMIN B-12 1000 MCG PO TABS: 1000.0000 ug | ORAL_TABLET | Freq: Every day | ORAL | 2 refills | Status: AC

## 2023-07-13 NOTE — Assessment & Plan Note (Signed)
 Follows with Dr. Wolfgang Phoenix.  No indication for EPO shots at this time.

## 2023-07-13 NOTE — Assessment & Plan Note (Signed)
 Patient has mild vitamin B12 deficiency with levels less than 400  - Start oral vitamin B12 1000 mcg daily

## 2023-07-13 NOTE — Assessment & Plan Note (Signed)
 Likely secondary to chronic kidney disease with slightly elevated free light chain ratio SPEP: No M spike, IFE: Normal  - Continue to monitor at this time

## 2023-07-13 NOTE — Patient Instructions (Addendum)
 VISIT SUMMARY:  Today, you came in for a follow-up visit regarding your anemia after starting iron  supplementation. You reported feeling better with increased energy and less slowness in your activities. Your hemoglobin and iron  levels have improved slightly, and there are no signs of blood loss. Additionally, your vitamin B12 level is low. You also mentioned experiencing swelling in your legs.  YOUR PLAN:  -IRON  DEFICIENCY ANEMIA: Iron  deficiency anemia is a condition where your body lacks enough iron  to produce healthy red blood cells. Your energy and hemoglobin levels have improved with iron  supplementation. We will administer two additional doses of IV iron  and continue your oral iron  supplementation every other day.  -VITAMIN B12 DEFICIENCY: Vitamin B12 deficiency means your body lacks enough vitamin B12, which is essential for nerve function and the production of red blood cells. We recommend starting oral vitamin B12 supplementation, which you can get over-the-counter.  INSTRUCTIONS:  Please follow up as needed or if you experience any new symptoms. Continue taking your medications as prescribed and monitor for any changes in your condition.

## 2023-07-13 NOTE — Progress Notes (Signed)
 [facility] at [facility]  HEMATOLOGY FOLLOW-UP VISIT  Kevin Bitters, MD  REASON FOR FOLLOW-UP: Anemia of chronic kidney disease  ASSESSMENT & PLAN:  Patient is a 89 y.o. male following for anemia of chronic kidney disease    Anemia of chronic disease Patient likely has anemia of chronic kidney disease along with a component of iron  deficiency. Current TSAT 26 with a ferritin of 21. SPEP: Negative, IFE: Normal Slight elevated free light chains with slightly elevated FLC ratio but less than 100 S/p some improvement in iron  level and hemoglobin with IV iron   - Will administer 2 more doses of IV iron  with a TSAT goal of greater than 30.  Reemphasized the adverse effects including allergic reactions, nausea, headache. - Continue oral iron  to every other day.  Take MiraLAX for constipation - Will consider ESA shots if patient's hemoglobin remains less than 10 after replenishing iron   Return to clinic in 2 months with labs  CKD (chronic kidney disease) stage 4, GFR 15-29 ml/min (HCC) Follows with Dr. Carrolyn Clan.   No indication for EPO shots at this time.  Elevated serum immunoglobulin free light chains Likely secondary to chronic kidney disease with slightly elevated free light chain ratio SPEP: No M spike, IFE: Normal  - Continue to monitor at this time  Vitamin B12 deficiency Patient has mild vitamin B12 deficiency with levels less than 400  - Start oral vitamin B12 1000 mcg daily    Orders Placed This Encounter  Procedures   Ferritin    Standing Status:   Future    Expected Date:   09/07/2023    Expiration Date:   12/06/2023   Folate    Standing Status:   Future    Expected Date:   09/07/2023    Expiration Date:   12/06/2023   Vitamin B12    Standing Status:   Future    Expected Date:   09/07/2023    Expiration Date:   12/06/2023   CBC with Differential/Platelet    Standing Status:   Future    Expected Date:   09/07/2023    Expiration Date:    12/06/2023   Comprehensive metabolic panel with GFR    Standing Status:   Future    Expected Date:   09/07/2023    Expiration Date:   12/06/2023   Iron  and TIBC    Standing Status:   Future    Expected Date:   09/07/2023    Expiration Date:   12/06/2023    The total time spent in the appointment was 20 minutes encounter with patients including review of chart and various tests results, discussions about plan of care and coordination of care plan   All questions were answered. The patient knows to call the clinic with any problems, questions or concerns. No barriers to learning was detected.  Eduardo Grade, MD 6/16/20251:42 PM   SUMMARY OF HEMATOLOGIC HISTORY: Anemia of chronic kidney disease - SPEP: No M spike, IFE: Normal - Slightly elevated free light chains with slightly elevated FLC ratio, less than 100 - S/p IV Feraheme  510 mg X 2 doses on 06/04/2023 and 06/11/2023   INTERVAL HISTORY: Kevin [name] 89 y.o. male following for anemia of chronic kidney disease. Patient was accompanied by his daughter today.  He feels better after receiving iron  supplementation, noting increased energy levels and less slowness in his activities. He has been taking iron  pills every other day without experiencing constipation. No signs of blood  loss, such as blood in stools, are present. Previous workup for multiple myeloma was negative, and other tests have not indicated any issues. His vitamin B12 level is low.  He lives alone and does not report any other complaints during this visit.  I have reviewed the past medical history, past surgical history, social history and family history with the patient   ALLERGIES:  has no known allergies.  MEDICATIONS:  Current Outpatient Medications  Medication Sig Dispense Refill   acetaminophen  (TYLENOL ) 500 MG tablet Take 500 mg by mouth as needed for mild pain or fever.     amLODipine  (NORVASC ) 10 MG tablet Take 10 mg by mouth daily.     aspirin  EC 81 MG  tablet Take 81 mg by mouth daily.     atorvastatin  (LIPITOR ) 80 MG tablet Take 1 tablet (80 mg total) by mouth every evening. 90 tablet 3   brimonidine (ALPHAGAN) 0.2 % ophthalmic solution INSTILL ONE DROP IN THE LEFT EYE TWICE DAILY     calcitRIOL (ROCALTROL) 0.25 MCG capsule Take 0.25 mcg by mouth 3 (three) times a week.     Cholecalciferol  (VITAMIN D -3) 1000 UNITS CAPS Take 1,000 Units by mouth daily.      cyanocobalamin  (VITAMIN B12) 1000 MCG tablet Take 1 tablet (1,000 mcg total) by mouth daily. 90 tablet 2   dorzolamide-timolol (COSOPT) 22.3-6.8 MG/ML ophthalmic solution PLACE ONE DROP INTO THE LEFT EYE TWICE DAILY     furosemide  (LASIX ) 20 MG tablet Take 40 mg by mouth 2 (two) times daily.     guaiFENesin  (MUCINEX ) 600 MG 12 hr tablet Take 600 mg by mouth daily.     hydrALAZINE  (APRESOLINE ) 100 MG tablet Take 0.5 tablets by mouth 3 (three) times daily.     iron  polysaccharides (NIFEREX) 150 MG capsule Take 150 mg by mouth daily.     isosorbide  mononitrate (IMDUR ) 60 MG 24 hr tablet Take 1 tablet (60 mg total) by mouth daily. 90 tablet 3   latanoprost (XALATAN) 0.005 % ophthalmic solution Place 1 drop into both eyes at bedtime.     loratadine (CLARITIN) 10 MG tablet Take 10 mg by mouth daily.  2   metoprolol  tartrate (LOPRESSOR ) 25 MG tablet TAKE 1/2 TABLET BY MOUTH TWICE DAILY. 90 tablet 2   nitroGLYCERIN  (NITROSTAT ) 0.4 MG SL tablet Place 1 tablet (0.4 mg total) under the tongue every 5 (five) minutes x 3 doses as needed for chest pain (if no relief after 2 nd dose, proceed to the ED or call 911). 75 tablet 1   OXYGEN  Inhale 1-2 L into the lungs continuous.     sodium bicarbonate  650 MG tablet Take 1 tablet (650 mg total) by mouth 2 (two) times daily. 60 tablet 0   No current facility-administered medications for this visit.     REVIEW OF SYSTEMS:   Constitutional: Denies fevers, chills or night sweats Eyes: Denies blurriness of vision Ears, nose, mouth, throat, and face: Denies  mucositis or sore throat Respiratory: Denies cough, dyspnea or wheezes Cardiovascular: Denies palpitation, chest discomfort or lower extremity swelling Gastrointestinal:  Denies nausea, heartburn or change in bowel habits Skin: Denies abnormal skin rashes Lymphatics: Denies new lymphadenopathy or easy bruising Neurological:Denies numbness, tingling or new weaknesses Behavioral/Psych: Mood is stable, no new changes  All other systems were reviewed with the patient and are negative.  PHYSICAL EXAMINATION:   Vitals:   07/13/23 1326  BP: (!) 122/51  Pulse: 63  Resp: 18  Temp: 98 F (36.7 C)  SpO2: 97%     GENERAL:alert, no distress and comfortable SKIN: skin color, texture, turgor are normal, no rashes or significant lesions LUNGS: clear to auscultation and percussion with normal breathing effort HEART: regular rate & rhythm and no murmurs and B/L pitting pedal edema ABDOMEN:abdomen soft, non-tender and normal bowel sounds Musculoskeletal:no cyanosis of digits and no clubbing  NEURO: alert & oriented x 3 with fluent speech  LABORATORY DATA:  I have reviewed the data as listed  Lab Results  Component Value Date   WBC 8.2 07/06/2023   NEUTROABS 5.9 07/06/2023   HGB 10.6 (L) 07/06/2023   HCT 34.9 (L) 07/06/2023   MCV 102.0 (H) 07/06/2023   PLT 136 (L) 07/06/2023      Chemistry      Component Value Date/Time   NA 141 07/06/2023 1244   K 3.8 07/06/2023 1244   CL 101 07/06/2023 1244   CO2 31 07/06/2023 1244   BUN 48 (H) 07/06/2023 1244   CREATININE 2.36 (H) 07/06/2023 1244      Component Value Date/Time   CALCIUM  8.7 (L) 07/06/2023 1244   ALKPHOS 78 07/06/2023 1244   AST 30 07/06/2023 1244   ALT 35 07/06/2023 1244   BILITOT 1.0 07/06/2023 1244      Latest Reference Range & Units 07/06/23 12:44  Iron  45 - 182 ug/dL 70  UIBC ug/dL 161  TIBC 096 - 045 ug/dL 409 (L)  Saturation Ratios 17.9 - 39.5 % 28  Ferritin 24 - 336 ng/mL 172  Folate >5.9 ng/mL 10.5   Vitamin B12 180 - 914 pg/mL 303  (L): Data is abnormally low  Latest Reference Range & Units 02/23/23 11:19 02/23/23 11:20 05/18/23 13:17  Total Protein ELP 6.0 - 8.5 g/dL 6.2 (C)    Albumin SerPl Elph-Mcnc 2.9 - 4.4 g/dL 3.3 (C)    Albumin/Glob SerPl 0.7 - 1.7  1.2 (C)    Alpha2 Glob SerPl Elph-Mcnc 0.4 - 1.0 g/dL 0.7 (C)    Alpha 1 0.0 - 0.4 g/dL 0.3 (C)    Gamma Glob SerPl Elph-Mcnc 0.4 - 1.8 g/dL 0.9 (C)    M Protein SerPl Elph-Mcnc Not Observed g/dL Not Observed (C)    IFE 1  Comment (C)    Globulin, Total 2.2 - 3.9 g/dL 2.9 (C)    B-Globulin SerPl Elph-Mcnc 0.7 - 1.3 g/dL 0.9 (C)    IgG (Immunoglobin G), Serum 603 - 1,613 mg/dL 811    IgM (Immunoglobulin M), Srm 15 - 143 mg/dL 61    IgA 61 - 914 mg/dL 782    (L): Data is abnormally low (C): Corrected   Latest Reference Range & Units 02/23/23 11:20  Kappa free light chain 3.3 - 19.4 mg/L 78.9 (H)  Lambda free light chains 5.7 - 26.3 mg/L 44.8 (H)  Kappa, lambda light chain ratio 0.26 - 1.65  1.76 (H)  (H): Data is abnormally high

## 2023-07-13 NOTE — Assessment & Plan Note (Addendum)
 Patient likely has anemia of chronic kidney disease along with a component of iron  deficiency. Current TSAT 26 with a ferritin of 21. SPEP: Negative, IFE: Normal Slight elevated free light chains with slightly elevated FLC ratio but less than 100 S/p some improvement in iron  level and hemoglobin with IV iron   - Will administer 2 more doses of IV iron  with a TSAT goal of greater than 30.  Reemphasized the adverse effects including allergic reactions, nausea, headache. - Continue oral iron  to every other day.  Take MiraLAX for constipation - Will consider ESA shots if patient's hemoglobin remains less than 10 after replenishing iron   Return to clinic in 2 months with labs

## 2023-07-15 DIAGNOSIS — H04123 Dry eye syndrome of bilateral lacrimal glands: Secondary | ICD-10-CM | POA: Diagnosis not present

## 2023-07-16 ENCOUNTER — Inpatient Hospital Stay

## 2023-07-21 DIAGNOSIS — H401123 Primary open-angle glaucoma, left eye, severe stage: Secondary | ICD-10-CM | POA: Diagnosis not present

## 2023-07-23 ENCOUNTER — Inpatient Hospital Stay

## 2023-07-23 VITALS — BP 129/48 | HR 52 | Temp 97.3°F | Resp 17

## 2023-07-23 DIAGNOSIS — N184 Chronic kidney disease, stage 4 (severe): Secondary | ICD-10-CM | POA: Diagnosis not present

## 2023-07-23 DIAGNOSIS — D638 Anemia in other chronic diseases classified elsewhere: Secondary | ICD-10-CM

## 2023-07-23 DIAGNOSIS — D631 Anemia in chronic kidney disease: Secondary | ICD-10-CM | POA: Diagnosis not present

## 2023-07-23 DIAGNOSIS — Z79899 Other long term (current) drug therapy: Secondary | ICD-10-CM | POA: Diagnosis not present

## 2023-07-23 DIAGNOSIS — E538 Deficiency of other specified B group vitamins: Secondary | ICD-10-CM | POA: Diagnosis not present

## 2023-07-23 DIAGNOSIS — R768 Other specified abnormal immunological findings in serum: Secondary | ICD-10-CM | POA: Diagnosis not present

## 2023-07-23 MED ORDER — SODIUM CHLORIDE 0.9% FLUSH: 10.0000 mL | Freq: Once | INTRAVENOUS | Status: DC | PRN

## 2023-07-23 MED ORDER — HEPARIN SOD (PORK) LOCK FLUSH 100 UNIT/ML IV SOLN: 250.0000 [IU] | Freq: Once | INTRAVENOUS | Status: DC | PRN

## 2023-07-23 MED ORDER — SODIUM CHLORIDE 0.9% FLUSH
3.0000 mL | Freq: Once | INTRAVENOUS | Status: DC | PRN
Start: 2023-07-23 — End: 2023-07-23

## 2023-07-23 MED ORDER — HEPARIN SOD (PORK) LOCK FLUSH 100 UNIT/ML IV SOLN: 500.0000 [IU] | Freq: Once | INTRAVENOUS | Status: DC | PRN

## 2023-07-23 MED ORDER — SODIUM CHLORIDE 0.9 % IV SOLN
510.0000 mg | Freq: Once | INTRAVENOUS | Status: AC
  Administered 2023-07-23: 510 mg via INTRAVENOUS
  Filled 2023-07-23: qty 510

## 2023-07-23 MED ORDER — SODIUM CHLORIDE 0.9 % IV SOLN: INTRAVENOUS | Status: DC

## 2023-07-23 MED ORDER — ALTEPLASE 2 MG IJ SOLR: 2.0000 mg | Freq: Once | INTRAMUSCULAR | Status: DC | PRN

## 2023-07-23 NOTE — Progress Notes (Signed)
 Patient presents today for iron infusion.  Patient is in satisfactory condition with no new complaints voiced.  Vital signs are stable.  We will proceed with infusion per provider orders.    Peripheral IV started with good blood return pre and post infusion.  Feraheme 510 mg given today per MD orders. Tolerated infusion without adverse affects. Vital signs stable. No complaints at this time. Discharged from clinic via wheelchair in stable condition. Alert and oriented x 3. F/U with [facility] as scheduled.

## 2023-07-23 NOTE — Patient Instructions (Signed)

## 2023-07-30 ENCOUNTER — Inpatient Hospital Stay: Attending: Oncology

## 2023-07-30 VITALS — BP 116/47 | HR 47 | Temp 96.6°F | Resp 20

## 2023-07-30 DIAGNOSIS — D638 Anemia in other chronic diseases classified elsewhere: Secondary | ICD-10-CM

## 2023-07-30 DIAGNOSIS — D631 Anemia in chronic kidney disease: Secondary | ICD-10-CM | POA: Diagnosis not present

## 2023-07-30 DIAGNOSIS — N184 Chronic kidney disease, stage 4 (severe): Secondary | ICD-10-CM | POA: Insufficient documentation

## 2023-07-30 MED ORDER — SODIUM CHLORIDE 0.9 % IV SOLN
510.0000 mg | Freq: Once | INTRAVENOUS | Status: AC
  Administered 2023-07-30: 510 mg via INTRAVENOUS
  Filled 2023-07-30: qty 510

## 2023-07-30 MED ORDER — SODIUM CHLORIDE 0.9 % IV SOLN: INTRAVENOUS | Status: AC

## 2023-07-30 NOTE — Progress Notes (Signed)
   07/30/23 1400  Spiritual Encounters  Type of Visit Initial  Care provided to: Pt and family (Daughter, Actor)  Referral source Other (comment) (Chaplain making rounds)  Reason for visit  (Introduction to Spiritual Care)  OnCall Visit No  Spiritual Framework  Presenting Themes Meaning/purpose/sources of inspiration;Values and beliefs;Caregiving needs;Coping tools  Community/Connection Family;Faith community  Interventions  Spiritual Care Interventions Made Established relationship of care and support;Narrative/life review;Explored values/beliefs/practices/strengths;Prayer  Spiritual Care Plan  Spiritual Care Issues Still Outstanding Chaplain will continue to follow   Reason for Visit: Chaplain making rounds on the floor visiting infusion Pts   Description of Visit: Arriving in the room I found Kevin [name] in a recliner chair receiving treatment, with his daughter Madalyn) present for support.   I introduced myself as the new chaplain for the cancer center and provided education to explain that the role of a chaplain is more than just a religious presence.  They were receptive to my presence, and open to conversation.   I encouraged storytelling and life review, while exploring spiritual, emotional, and relational needs.  Sai appears to be a strong man of faith and does claim to have anxiety regarding his health.  He states, "I place myself in God's hands."   Halton's daughter was present as his support person and worked to encourage her in her caregiving to ensure that she would feel seen and that someone would recognize that her role in this journey is difficult as well.   Upon completion of our time together, Sophia asked for prayer and I offered it freely.   Plan of Care: I mentioned to Kevin [name] that I would like to check in with him at his next visit and he stated that he would be open to that.  I will mark my calendar.   Maude Roll, MDiv   Chaplain, [facility]   [email or web address] [phone]

## 2023-08-06 DIAGNOSIS — I739 Peripheral vascular disease, unspecified: Secondary | ICD-10-CM | POA: Diagnosis not present

## 2023-08-06 DIAGNOSIS — M79671 Pain in right foot: Secondary | ICD-10-CM | POA: Diagnosis not present

## 2023-08-06 DIAGNOSIS — M79672 Pain in left foot: Secondary | ICD-10-CM | POA: Diagnosis not present

## 2023-08-06 DIAGNOSIS — M79674 Pain in right toe(s): Secondary | ICD-10-CM | POA: Diagnosis not present

## 2023-08-06 DIAGNOSIS — M79675 Pain in left toe(s): Secondary | ICD-10-CM | POA: Diagnosis not present

## 2023-08-25 ENCOUNTER — Other Ambulatory Visit: Payer: Self-pay | Admitting: Cardiology

## 2023-08-31 DIAGNOSIS — I10 Essential (primary) hypertension: Secondary | ICD-10-CM | POA: Diagnosis not present

## 2023-08-31 DIAGNOSIS — Z299 Encounter for prophylactic measures, unspecified: Secondary | ICD-10-CM | POA: Diagnosis not present

## 2023-08-31 DIAGNOSIS — Z0000 Encounter for general adult medical examination without abnormal findings: Secondary | ICD-10-CM | POA: Diagnosis not present

## 2023-08-31 DIAGNOSIS — E7800 Pure hypercholesterolemia, unspecified: Secondary | ICD-10-CM | POA: Diagnosis not present

## 2023-08-31 DIAGNOSIS — Z79899 Other long term (current) drug therapy: Secondary | ICD-10-CM | POA: Diagnosis not present

## 2023-08-31 DIAGNOSIS — R5383 Other fatigue: Secondary | ICD-10-CM | POA: Diagnosis not present

## 2023-09-08 ENCOUNTER — Inpatient Hospital Stay: Attending: Oncology

## 2023-09-08 DIAGNOSIS — E538 Deficiency of other specified B group vitamins: Secondary | ICD-10-CM | POA: Insufficient documentation

## 2023-09-08 DIAGNOSIS — D509 Iron deficiency anemia, unspecified: Secondary | ICD-10-CM | POA: Insufficient documentation

## 2023-09-08 DIAGNOSIS — D638 Anemia in other chronic diseases classified elsewhere: Secondary | ICD-10-CM

## 2023-09-08 DIAGNOSIS — R768 Other specified abnormal immunological findings in serum: Secondary | ICD-10-CM | POA: Insufficient documentation

## 2023-09-08 DIAGNOSIS — N184 Chronic kidney disease, stage 4 (severe): Secondary | ICD-10-CM | POA: Insufficient documentation

## 2023-09-08 DIAGNOSIS — D631 Anemia in chronic kidney disease: Secondary | ICD-10-CM | POA: Insufficient documentation

## 2023-09-08 DIAGNOSIS — Z9981 Dependence on supplemental oxygen: Secondary | ICD-10-CM | POA: Diagnosis not present

## 2023-09-08 LAB — CBC WITH DIFFERENTIAL/PLATELET
Abs Immature Granulocytes: 0.03 K/uL (ref 0.00–0.07)
Basophils Absolute: 0 K/uL (ref 0.0–0.1)
Basophils Relative: 0 %
Eosinophils Absolute: 0.2 K/uL (ref 0.0–0.5)
Eosinophils Relative: 2 %
HCT: 34.3 % — ABNORMAL LOW (ref 39.0–52.0)
Hemoglobin: 10.7 g/dL — ABNORMAL LOW (ref 13.0–17.0)
Immature Granulocytes: 0 %
Lymphocytes Relative: 20 %
Lymphs Abs: 1.8 K/uL (ref 0.7–4.0)
MCH: 31.8 pg (ref 26.0–34.0)
MCHC: 31.2 g/dL (ref 30.0–36.0)
MCV: 101.8 fL — ABNORMAL HIGH (ref 80.0–100.0)
Monocytes Absolute: 0.6 K/uL (ref 0.1–1.0)
Monocytes Relative: 7 %
Neutro Abs: 6.1 K/uL (ref 1.7–7.7)
Neutrophils Relative %: 71 %
Platelets: 129 K/uL — ABNORMAL LOW (ref 150–400)
RBC: 3.37 MIL/uL — ABNORMAL LOW (ref 4.22–5.81)
RDW: 13.5 % (ref 11.5–15.5)
WBC: 8.7 K/uL (ref 4.0–10.5)
nRBC: 0 % (ref 0.0–0.2)

## 2023-09-08 LAB — COMPREHENSIVE METABOLIC PANEL WITH GFR
ALT: 30 U/L (ref 0–44)
AST: 25 U/L (ref 15–41)
Albumin: 3.6 g/dL (ref 3.5–5.0)
Alkaline Phosphatase: 71 U/L (ref 38–126)
Anion gap: 10 (ref 5–15)
BUN: 65 mg/dL — ABNORMAL HIGH (ref 8–23)
CO2: 26 mmol/L (ref 22–32)
Calcium: 8.7 mg/dL — ABNORMAL LOW (ref 8.9–10.3)
Chloride: 101 mmol/L (ref 98–111)
Creatinine, Ser: 2.84 mg/dL — ABNORMAL HIGH (ref 0.61–1.24)
GFR, Estimated: 20 mL/min — ABNORMAL LOW (ref >60)
Glucose, Bld: 101 mg/dL — ABNORMAL HIGH (ref 70–99)
Potassium: 4.3 mmol/L (ref 3.5–5.1)
Sodium: 137 mmol/L (ref 135–145)
Total Bilirubin: 0.5 mg/dL (ref 0.0–1.2)
Total Protein: 6.4 g/dL — ABNORMAL LOW (ref 6.5–8.1)

## 2023-09-08 LAB — IRON AND TIBC
Iron: 80 ug/dL (ref 45–182)
Saturation Ratios: 31 % (ref 17.9–39.5)
TIBC: 259 ug/dL (ref 250–450)
UIBC: 179 ug/dL

## 2023-09-08 LAB — FOLATE: Folate: 11.1 ng/mL (ref >5.9)

## 2023-09-08 LAB — FERRITIN: Ferritin: 312 ng/mL (ref 24–336)

## 2023-09-08 LAB — VITAMIN B12: Vitamin B-12: 5320 pg/mL — ABNORMAL HIGH (ref 180–914)

## 2023-09-09 DIAGNOSIS — R809 Proteinuria, unspecified: Secondary | ICD-10-CM | POA: Diagnosis not present

## 2023-09-09 DIAGNOSIS — I10 Essential (primary) hypertension: Secondary | ICD-10-CM | POA: Diagnosis not present

## 2023-09-09 DIAGNOSIS — D631 Anemia in chronic kidney disease: Secondary | ICD-10-CM | POA: Diagnosis not present

## 2023-09-09 DIAGNOSIS — N189 Chronic kidney disease, unspecified: Secondary | ICD-10-CM | POA: Diagnosis not present

## 2023-09-15 ENCOUNTER — Inpatient Hospital Stay (HOSPITAL_BASED_OUTPATIENT_CLINIC_OR_DEPARTMENT_OTHER): Admitting: Oncology

## 2023-09-15 VITALS — BP 119/64 | HR 68 | Temp 97.5°F | Resp 18 | Ht 66.0 in | Wt 213.0 lb

## 2023-09-15 DIAGNOSIS — D631 Anemia in chronic kidney disease: Secondary | ICD-10-CM | POA: Diagnosis not present

## 2023-09-15 DIAGNOSIS — E538 Deficiency of other specified B group vitamins: Secondary | ICD-10-CM

## 2023-09-15 DIAGNOSIS — R768 Other specified abnormal immunological findings in serum: Secondary | ICD-10-CM

## 2023-09-15 DIAGNOSIS — Z9981 Dependence on supplemental oxygen: Secondary | ICD-10-CM | POA: Diagnosis not present

## 2023-09-15 DIAGNOSIS — D638 Anemia in other chronic diseases classified elsewhere: Secondary | ICD-10-CM | POA: Diagnosis not present

## 2023-09-15 DIAGNOSIS — N184 Chronic kidney disease, stage 4 (severe): Secondary | ICD-10-CM

## 2023-09-15 DIAGNOSIS — D509 Iron deficiency anemia, unspecified: Secondary | ICD-10-CM | POA: Diagnosis not present

## 2023-09-15 NOTE — Assessment & Plan Note (Addendum)
 Likely secondary to chronic kidney disease with slightly elevated free light chain ratio SPEP: No M spike, IFE: Normal  - Continue to monitor at this time

## 2023-09-15 NOTE — Assessment & Plan Note (Addendum)
 She was started on B12 supplements at her last visit.  Labs from 09/08/2023 shows a B12 level of 5320.  -Discontinue B12 for now and we will recheck at next visit.  B12 levels can be falsely elevated in the presence of underlying inflammation

## 2023-09-15 NOTE — Progress Notes (Signed)
 [name] OFFICE PROGRESS NOTE  Kevin Leta NOVAK, MD  ASSESSMENT & PLAN:    Assessment & Plan CKD (chronic kidney disease) stage 4, GFR 15-29 ml/min (HCC) Follows with Dr. Rachele.   No indication for EPO shots at this time. Anemia of chronic disease Patient likely has anemia of chronic kidney disease along with a component of iron  deficiency. Current iron  saturation is 31% and ferritin 312. SPEP: Negative, IFE: Normal Slight elevated free light chains with slightly elevated FLC ratio but less than 100 S/p improvement of his iron  levels since his iron  infusion although his hemoglobin has not normalized.  - Continue oral iron  to every other day.  Take MiraLAX for constipation - Will consider ESA shots if patient's hemoglobin remains less than 10 after replenishing iron   Return to clinic in 3 months with labs Elevated serum immunoglobulin free light chains Likely secondary to chronic kidney disease with slightly elevated free light chain ratio SPEP: No M spike, IFE: Normal  - Continue to monitor at this time Vitamin B12 deficiency She was started on B12 supplements at her last visit.  Labs from 09/08/2023 shows a B12 level of 5320.  -Discontinue B12 for now and we will recheck at next visit.  B12 levels can be falsely elevated in the presence of underlying inflammation   Orders Placed This Encounter  Procedures   Iron  and TIBC    Standing Status:   Future    Expected Date:   12/16/2023    Expiration Date:   03/15/2024   Comprehensive metabolic panel with GFR    Standing Status:   Future    Expected Date:   12/16/2023    Expiration Date:   03/15/2024   CBC with Differential/Platelet    Standing Status:   Future    Expected Date:   12/16/2023    Expiration Date:   03/15/2024   Vitamin B12    Standing Status:   Future    Expected Date:   12/16/2023    Expiration Date:   03/15/2024   Folate    Standing Status:   Future    Expected Date:   12/16/2023     Expiration Date:   03/15/2024   Ferritin    Standing Status:   Future    Expected Date:   12/16/2023    Expiration Date:   03/15/2024    INTERVAL HISTORY: Patient returns for follow-up for anemia due to CKD and iron  deficiency, B12 deficiency, elevated serum immunoglobulins and chronic kidney disease.  Since his last visit, he has been taking B12 supplements daily.  He is taking iron  supplements every other day.  He also received 4 doses of IV iron  (Feraheme  510 mg) on 06/04/2023, 06/11/2023, 07/23/2023 and 07/30/2023.  Reports overall doing well since his last visit with us .  He is chronically on 2 L of oxygen .  We reviewed CBC, iron  panel, B12 and folate level.  SUMMARY OF HEMATOLOGIC HISTORY: Anemia of chronic kidney disease - SPEP: No M spike, IFE: Normal - Slightly elevated free light chains with slightly elevated FLC ratio, less than 100 - S/p IV Feraheme  510 mg X 2 doses on 06/04/2023 and 06/11/2023 -S/p IV Feraheme  510 mg x 2 doses on 07/23/2023 and 07/30/2023.    Lab Results  Component Value Date   HGB 10.7 (L) 09/08/2023   FERRITIN 312 09/08/2023   VITAMINB12 5,320 (H) 09/08/2023    Vitals:   09/15/23 1305  BP: 119/64  Pulse: 68  Resp:  18  Temp: (!) 97.5 F (36.4 C)  SpO2: 97%    Review of Systems  Constitutional:  Positive for malaise/fatigue.  Respiratory:  Positive for shortness of breath.     Physical Exam Constitutional:      Appearance: Normal appearance.  Cardiovascular:     Rate and Rhythm: Normal rate and regular rhythm.  Pulmonary:     Effort: Pulmonary effort is normal.     Breath sounds: Normal breath sounds.  Abdominal:     General: Bowel sounds are normal.     Palpations: Abdomen is soft.  Musculoskeletal:        General: No swelling. Normal range of motion.  Neurological:     Mental Status: He is alert and oriented to person, place, and time. Mental status is at baseline.     I spent 25 minutes dedicated to the care of this patient  (face-to-face and non-face-to-face) on the date of the encounter to include what is described in the assessment and plan.,  Delon Hope, NP 09/15/2023 8:46 PM

## 2023-09-15 NOTE — Assessment & Plan Note (Addendum)
 Follows with Dr. Wolfgang Phoenix.  No indication for EPO shots at this time.

## 2023-09-15 NOTE — Assessment & Plan Note (Addendum)
 Patient likely has anemia of chronic kidney disease along with a component of iron  deficiency. Current iron  saturation is 31% and ferritin 312. SPEP: Negative, IFE: Normal Slight elevated free light chains with slightly elevated FLC ratio but less than 100 S/p improvement of his iron  levels since his iron  infusion although his hemoglobin has not normalized.  - Continue oral iron  to every other day.  Take MiraLAX for constipation - Will consider ESA shots if patient's hemoglobin remains less than 10 after replenishing iron   Return to clinic in 3 months with labs

## 2023-09-17 DIAGNOSIS — N184 Chronic kidney disease, stage 4 (severe): Secondary | ICD-10-CM | POA: Diagnosis not present

## 2023-09-17 DIAGNOSIS — I5032 Chronic diastolic (congestive) heart failure: Secondary | ICD-10-CM | POA: Diagnosis not present

## 2023-09-17 DIAGNOSIS — N2581 Secondary hyperparathyroidism of renal origin: Secondary | ICD-10-CM | POA: Diagnosis not present

## 2023-09-17 DIAGNOSIS — I129 Hypertensive chronic kidney disease with stage 1 through stage 4 chronic kidney disease, or unspecified chronic kidney disease: Secondary | ICD-10-CM | POA: Diagnosis not present

## 2023-10-15 DIAGNOSIS — M79675 Pain in left toe(s): Secondary | ICD-10-CM | POA: Diagnosis not present

## 2023-10-15 DIAGNOSIS — M79672 Pain in left foot: Secondary | ICD-10-CM | POA: Diagnosis not present

## 2023-10-15 DIAGNOSIS — I739 Peripheral vascular disease, unspecified: Secondary | ICD-10-CM | POA: Diagnosis not present

## 2023-10-15 DIAGNOSIS — M79671 Pain in right foot: Secondary | ICD-10-CM | POA: Diagnosis not present

## 2023-10-15 DIAGNOSIS — M79674 Pain in right toe(s): Secondary | ICD-10-CM | POA: Diagnosis not present

## 2023-11-04 ENCOUNTER — Ambulatory Visit: Attending: Cardiology | Admitting: Cardiology

## 2023-11-04 ENCOUNTER — Encounter: Payer: Self-pay | Admitting: Cardiology

## 2023-11-04 VITALS — BP 138/60 | HR 52 | Ht 66.0 in | Wt 212.4 lb

## 2023-11-04 DIAGNOSIS — E782 Mixed hyperlipidemia: Secondary | ICD-10-CM | POA: Diagnosis not present

## 2023-11-04 DIAGNOSIS — I10 Essential (primary) hypertension: Secondary | ICD-10-CM | POA: Diagnosis not present

## 2023-11-04 DIAGNOSIS — N184 Chronic kidney disease, stage 4 (severe): Secondary | ICD-10-CM

## 2023-11-04 DIAGNOSIS — I25119 Atherosclerotic heart disease of native coronary artery with unspecified angina pectoris: Secondary | ICD-10-CM | POA: Diagnosis not present

## 2023-11-04 NOTE — Progress Notes (Signed)
    Cardiology Office Note  Date: 11/04/2023   ID: Kevin [name], DOB [birth year], MRN [id number]  History of Present Illness: Kevin [name] is a 89 y.o. male last seen in February.  He is here today with his family member for a follow-up visit.  Reports overall no change in status, no chest pain.  Does have chronic leg swelling that has been adequately controlled on current diuretic regimen.  His weight is stable.  We went over his medications.  Discussed stopping Lopressor  completely at this point with resting heart rate in the low 50s.  I rechecked his blood pressure today at 138/60.  He continues to follow with Dr. Rosamond.  Renal function has deteriorated further based on his most recent lab work in August.  Lipids look good however with LDL 53.  I reviewed his ECG today which shows sinus bradycardia with short PR interval and nonspecific ST changes.  Physical Exam: VS:  BP 138/60 (BP Location: Left Arm)   Pulse (!) 52   Ht 5' 6 (1.676 m)   Wt 212 lb 6.4 oz (96.3 kg)   SpO2 94%   BMI 34.28 kg/m , BMI Body mass index is 34.28 kg/m.  Wt Readings from Last 3 Encounters:  11/04/23 212 lb 6.4 oz (96.3 kg)  09/15/23 213 lb (96.6 kg)  06/01/23 214 lb 15.2 oz (97.5 kg)    General: Patient appears comfortable at rest.  Wearing supplemental oxygen  via nasal cannula. Neck: Supple, no elevated JVP or carotid bruits. Lungs: Clear to auscultation, nonlabored breathing at rest. Cardiac: Regular rate and rhythm, no S3, 1/6 systolic murmur. Extremities: Stable, mild ankle edema..  ECG:  An ECG dated 09/01/2022 was personally reviewed today and demonstrated:  Sinus bradycardia with short PR interval and nonspecific ST changes.  Labwork: 09/08/2023: ALT 30; AST 25; BUN 65; Creatinine, Ser 2.84; Hemoglobin 10.7; Platelets 129; Potassium 4.3; Sodium 137  August 2025: Cholesterol 120, triglycerides 72, HDL 52, LDL 53  Other Studies Reviewed Today:  No interval cardiac testing for review  today.  Assessment and Plan:  1.  Multivessel CAD status post DES to the LAD in 2014 with known occlusion of the mid to distal RCA associated with left-to-right collaterals and medically managed circumflex stenosis.  Last ischemic testing was in 2022, low risk Lexiscan  Myoview .  Continue medical therapy in the absence of accelerating angina.  I reviewed his ECG.  Continue aspirin  81 mg daily, Imdur  60 mg daily, Lipitor  80 mg daily, and as needed nitroglycerin .   2.  Mixed hyperlipidemia.  LDL 53 in August.  Continue Lipitor  80 mg daily.   3.  Primary hypertension.  Stopping Lopressor  given bradycardia with heart rate in the 50s.  Continue hydralazine  100 mg 3 times a day and Norvasc  10 mg daily.   4.  HFpEF, LVEF 60 to 65% with moderate mitral regurgitation, moderate tricuspid regurgitation, and severely elevated estimated RVSP by echocardiogram in August 2024 at [facility].  Continue Lasix  40 mg twice daily.  Not candidate for Leonore due to degree of renal insufficiency.   5.  CKD stage IV, creatinine creatinine 2.84 with GFR 20 in August.  Disposition:  Follow up 6 months.  Signed, Jayson JUDITHANN Sierras, M.D., F.A.C.C. [facility]

## 2023-11-04 NOTE — Patient Instructions (Addendum)
 Medication Instructions:   STOP Lopressor   Labwork: None today  Testing/Procedures: None today  Follow-Up: 6 months  Any Other Special Instructions Will Be Listed Below (If Applicable).  If you need a refill on your cardiac medications before your next appointment, please call your pharmacy.

## 2023-11-10 DIAGNOSIS — H401123 Primary open-angle glaucoma, left eye, severe stage: Secondary | ICD-10-CM | POA: Diagnosis not present

## 2023-12-15 ENCOUNTER — Inpatient Hospital Stay: Attending: Oncology

## 2023-12-15 DIAGNOSIS — R7689 Other specified abnormal immunological findings in serum: Secondary | ICD-10-CM | POA: Insufficient documentation

## 2023-12-15 DIAGNOSIS — Z79899 Other long term (current) drug therapy: Secondary | ICD-10-CM | POA: Diagnosis not present

## 2023-12-15 DIAGNOSIS — D509 Iron deficiency anemia, unspecified: Secondary | ICD-10-CM | POA: Diagnosis not present

## 2023-12-15 DIAGNOSIS — D631 Anemia in chronic kidney disease: Secondary | ICD-10-CM | POA: Diagnosis present

## 2023-12-15 DIAGNOSIS — N184 Chronic kidney disease, stage 4 (severe): Secondary | ICD-10-CM | POA: Insufficient documentation

## 2023-12-15 DIAGNOSIS — Z87891 Personal history of nicotine dependence: Secondary | ICD-10-CM | POA: Diagnosis not present

## 2023-12-15 DIAGNOSIS — E538 Deficiency of other specified B group vitamins: Secondary | ICD-10-CM | POA: Insufficient documentation

## 2023-12-15 DIAGNOSIS — D638 Anemia in other chronic diseases classified elsewhere: Secondary | ICD-10-CM

## 2023-12-15 LAB — CBC WITH DIFFERENTIAL/PLATELET
Abs Immature Granulocytes: 0.02 K/uL (ref 0.00–0.07)
Basophils Absolute: 0 K/uL (ref 0.0–0.1)
Basophils Relative: 0 %
Eosinophils Absolute: 0.3 K/uL (ref 0.0–0.5)
Eosinophils Relative: 3 %
HCT: 34.8 % — ABNORMAL LOW (ref 39.0–52.0)
Hemoglobin: 10.6 g/dL — ABNORMAL LOW (ref 13.0–17.0)
Immature Granulocytes: 0 %
Lymphocytes Relative: 22 %
Lymphs Abs: 2 K/uL (ref 0.7–4.0)
MCH: 30.5 pg (ref 26.0–34.0)
MCHC: 30.5 g/dL (ref 30.0–36.0)
MCV: 100 fL (ref 80.0–100.0)
Monocytes Absolute: 0.6 K/uL (ref 0.1–1.0)
Monocytes Relative: 7 %
Neutro Abs: 5.9 K/uL (ref 1.7–7.7)
Neutrophils Relative %: 68 %
Platelets: 153 K/uL (ref 150–400)
RBC: 3.48 MIL/uL — ABNORMAL LOW (ref 4.22–5.81)
RDW: 13 % (ref 11.5–15.5)
WBC: 8.8 K/uL (ref 4.0–10.5)
nRBC: 0 % (ref 0.0–0.2)

## 2023-12-15 LAB — COMPREHENSIVE METABOLIC PANEL WITH GFR
ALT: 31 U/L (ref 0–44)
AST: 29 U/L (ref 15–41)
Albumin: 4 g/dL (ref 3.5–5.0)
Alkaline Phosphatase: 85 U/L (ref 38–126)
Anion gap: 9 (ref 5–15)
BUN: 57 mg/dL — ABNORMAL HIGH (ref 8–23)
CO2: 31 mmol/L (ref 22–32)
Calcium: 9.1 mg/dL (ref 8.9–10.3)
Chloride: 100 mmol/L (ref 98–111)
Creatinine, Ser: 2.55 mg/dL — ABNORMAL HIGH (ref 0.61–1.24)
GFR, Estimated: 23 mL/min — ABNORMAL LOW (ref >60)
Glucose, Bld: 99 mg/dL (ref 70–99)
Potassium: 4.5 mmol/L (ref 3.5–5.1)
Sodium: 141 mmol/L (ref 135–145)
Total Bilirubin: 0.4 mg/dL (ref 0.0–1.2)
Total Protein: 6.4 g/dL — ABNORMAL LOW (ref 6.5–8.1)

## 2023-12-15 LAB — IRON AND TIBC
Iron: 76 ug/dL (ref 45–182)
Saturation Ratios: 32 % (ref 17.9–39.5)
TIBC: 239 ug/dL — ABNORMAL LOW (ref 250–450)
UIBC: 163 ug/dL

## 2023-12-15 LAB — VITAMIN B12: Vitamin B-12: 946 pg/mL — ABNORMAL HIGH (ref 180–914)

## 2023-12-15 LAB — FOLATE: Folate: 9.4 ng/mL (ref >5.9)

## 2023-12-15 LAB — FERRITIN: Ferritin: 350 ng/mL — ABNORMAL HIGH (ref 24–336)

## 2023-12-21 NOTE — Progress Notes (Unsigned)
 [facility] [address], [place] [address]   CLINIC:  Medical Oncology/Hematology  PCP:  Rosamond Leta NOVAK, MD [address] [place] [address] [phone]   REASON FOR VISIT:  Follow-up for anemia secondary to CKD and iron  deficiency  CURRENT THERAPY: Intermittent IV iron   INTERVAL HISTORY:   Kevin [name] 89 y.o. male returns for routine follow-up of of his anemia related to CKD and iron  deficiency. He was last seen by NP Delon Hope on 09/15/2023.  He  denies any recent surgeries, hospitalizations, or changes in baseline health status.   At today's visit, he reports feeling fair.  He  reports 60% energy, but has low stamina and tires easily. He has 100% appetite, and is maintaining stable weight at this time.  He is taking iron  tablet every other day. He  denies any bleeding episodes. He has some mild baseline fatigue and dyspnea on exertion. No ice pica, headaches, chest pain, lightheadedness, or syncope.     ASSESSMENT & PLAN:  1.  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  Review of EMR shows mild to moderate anemia since at least 2014. - Follows with Dr. Rachele for CKD stage IV - SPEP and immunofixation normal.  Elevated FLC ratio addressed below. - Labs from January 2025 showed iron  deficiency (ferritin 29, iron  saturation 27%) - Patient reports EGD/colonoscopy done 10+ years ago, reportedly normal and told that he did not need any additional endoscopic studies at that time. - Required intermittent IV iron , most recently with Feraheme  x 1 on 08/16/2023. - She is taking iron  tablet every other day with MiraLAX for constipation  - Denies any rectal bleeding or melena. - Most recent labs (12/15/2023): Hgb 10.6/MCV 100.0.  Normal WBC/platelets. Ferritin 350, iron  saturation 32%. Creatinine 2.55/GFR 23. - PLAN: Recommend to HOLD  iron  tablet at this time. - Iron  deficiency has resolved, with residual anemia likely related to his underlying CKD.  RTC in 3 months with repeat labs, and consider ESA if Hgb <10 despite adequate iron  and B12 levels. - Unclear source of iron  deficiency  - could VS slow blood loss versus malabsorption.  Given his age, I am hesitant to recommend EGD/colonoscopy unless he has any evidence of severe hemorrhaging.  Would consider checking stool cards if he has any future drops in hemoglobin/ferritin levels.    2.  Vitamin B12 deficiency - Marginal B12 at 303 noted on 07/06/2023 - He was started on B12 supplements (1000 mcg daily) in June 2025, but labs from 09/08/2023 showed elevated vitamin B12 5322 therefore vitamin B12 supplement was discontinued - Most recent labs (12/15/2023): Vitamin B12 remains elevated but improved at 946.  Normal folate 9.4. - PLAN: No indication to restart vitamin B12 at this time.  3.  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  Minimally elevated FLC ratio 1.76 (kappa 78.9/lambda 44.8).   - PLAN: Minimally elevated FLC ratio within range of what would be expected given the elderly patient with CKD stage IV.  No further testing indicated at this time.  PLAN SUMMARY: >> Labs in 3 months = CBC/D, CMP, ferritin, iron /TIBC, B12, MMA, folate >> OFFICE visit in 3 months (1 week after labs)     REVIEW OF SYSTEMS:   Review of Systems  Constitutional:  Positive for fatigue.  Negative for appetite change, chills, diaphoresis, fever and unexpected weight change.  HENT:   Negative for lump/mass and nosebleeds.   Eyes:  Negative for eye problems.  Respiratory:  Positive for shortness of breath. Negative for cough and hemoptysis.   Cardiovascular:  Negative for chest pain, leg swelling and palpitations.  Gastrointestinal:  Positive for diarrhea. Negative for abdominal pain, blood in stool,  constipation, nausea and vomiting.  Genitourinary:  Negative for hematuria.   Skin: Negative.   Neurological:  Negative for dizziness, headaches and light-headedness.  Hematological:  Does not bruise/bleed easily.     PHYSICAL EXAM:  ECOG PERFORMANCE STATUS: 1 - Symptomatic but completely ambulatory  Vitals:   12/22/23 1036  BP: (!) 117/51  Pulse: (!) 52  Resp: 17  Temp: 97.9 F (36.6 C)  SpO2: 98%   Filed Weights   12/22/23 1036  Weight: 211 lb (95.7 kg)   Physical Exam Constitutional:      Appearance: Normal appearance. He is obese.     Comments: Supplemental oxygen  in place  Cardiovascular:     Rate and Rhythm: Bradycardia present.     Heart sounds: Normal heart sounds.  Pulmonary:     Breath sounds: Normal breath sounds.  Neurological:     General: No focal deficit present.     Mental Status: Mental status is at baseline.  Psychiatric:        Behavior: Behavior normal. Behavior is cooperative.     PAST MEDICAL/SURGICAL HISTORY:  Past Medical History:  Diagnosis Date   Arthritis    CAD (coronary artery disease)    a. s/p DES to LAD (2014)  b. s/p LHC (09/2014) with patent LAD stent and stable dz from previous cath    Chronic anemia    CKD (chronic kidney disease), stage IV (HCC)    Claudication    R leg with bilateral femoral bruits.   Essential hypertension    Hyperlipidemia    NSTEMI (non-ST elevated myocardial infarction) [facility])    Past Surgical History:  Procedure Laterality Date   Bilateral carpal tunnel release Bilateral    CARDIAC CATHETERIZATION N/A 10/10/2014   Procedure: Left Heart Cath and Coronary Angiography;  Surgeon: Victory LELON Sharps, MD;  Location: [facility] INVASIVE CV LAB;  Service: Cardiovascular;  Laterality: N/A;   LEFT HEART CATHETERIZATION WITH CORONARY ANGIOGRAM N/A 02/09/2012   Procedure: LEFT HEART CATHETERIZATION WITH CORONARY ANGIOGRAM;  Surgeon: Peter M Jordan, MD; left main 10%, LAD 80%/95%, D1 patent, CFX 70% at OM 2, dCFX 90%, RCA 100%  with L-R collaterals     PERCUTANEOUS CORONARY STENT INTERVENTION (PCI-S) N/A 02/11/2012   Procedure: PERCUTANEOUS CORONARY STENT INTERVENTION (PCI-S);  Surgeon: Lonni JONETTA Cash, MD; 3.0 x 28 mm Promus Premier DES to the mid LAD    REPAIR OF PERFORATED ULCER      SOCIAL HISTORY:  Social History   Socioeconomic History   Marital status: Widowed    Spouse name: Not on file   Number of children: 5   Years of education: Not on file   Highest education level: Not on file  Occupational History   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  Tobacco Use   Smoking status: Former    Current packs/day: 0.00    Average packs/day: 0.5 packs/day for 45.0 years (22.5 ttl pk-yrs)    Types: Cigarettes    Start date: 01/28/1968    Quit date: 02/06/2012    Years since quitting: 11.8   Smokeless tobacco: Never  Substance and Sexual Activity   Alcohol  use: No    Alcohol /week: 0.0 standard drinks of alcohol    Drug use: No   Sexual activity: Not on file  Other Topics Concern   Not on file  Social History Narrative   Lives in [place]    Social Drivers of Health   Financial Resource Strain: Low Risk (09/10/2022)   Received from [facility]   Overall Financial Resource Strain (CARDIA)    Difficulty of Paying Living Expenses: Not very hard  Food Insecurity: No Food Insecurity (09/10/2022)   Received from [facility]   Hunger Vital Sign    Within the past 12 months, you worried that your food would run out before you got the money to buy more.: Never true    Within the past 12 months, the food you bought just didn't last and you didn't have money to get more.: Never true  Transportation Needs: Unmet Transportation Needs (09/10/2022)   Received from [facility]   PRAPARE - Transportation    Lack of Transportation (Medical): Yes    Lack of Transportation (Non-Medical): Not on file  Physical Activity: Not on file  Stress: Not on file  Social Connections: Not on  file  Intimate Partner Violence: Not At Risk (09/10/2022)   Received from [facility]   Humiliation, Afraid, Rape, and Kick questionnaire    Within the last year, have you been afraid of your partner or ex-partner?: No    Within the last year, have you been humiliated or emotionally abused in other ways by your partner or ex-partner?: No    Within the last year, have you been kicked, hit, slapped, or otherwise physically hurt by your partner or ex-partner?: No    Within the last year, have you been raped or forced to have any kind of sexual activity by your partner or ex-partner?: No    FAMILY HISTORY:  Family History  Problem Relation Age of Onset   Hypertension Mother    Alzheimer's disease Mother    Other Father        brain tumor   Heart disease Brother    Heart disease Sister     CURRENT MEDICATIONS:  Outpatient Encounter Medications as of 12/22/2023  Medication Sig   acetaminophen  (TYLENOL ) 500 MG tablet Take 500 mg by mouth as needed for mild pain or fever.   amLODipine  (NORVASC ) 10 MG tablet Take 10 mg by mouth daily.   aspirin  EC 81 MG tablet Take 81 mg by mouth daily.   atorvastatin  (LIPITOR ) 80 MG tablet Take 1 tablet (80 mg total) by mouth every evening.   Azelastine HCl 137 MCG/SPRAY SOLN Place 1 spray into both nostrils 2 (two) times daily.   brimonidine (ALPHAGAN) 0.2 % ophthalmic solution INSTILL ONE DROP IN THE LEFT EYE TWICE DAILY   calcitRIOL (ROCALTROL) 0.25 MCG capsule Take 0.25 mcg by mouth 3 (three) times a week.   Cholecalciferol  (VITAMIN D -3) 1000 UNITS CAPS Take 1,000 Units by mouth daily.    cyanocobalamin  (VITAMIN B12) 1000 MCG tablet Take 1 tablet (1,000 mcg total) by mouth daily.   dorzolamide-timolol (COSOPT) 22.3-6.8 MG/ML ophthalmic solution PLACE ONE DROP INTO THE LEFT EYE TWICE DAILY   furosemide  (LASIX ) 20 MG tablet Take 40 mg by mouth 2 (two) times daily.   guaiFENesin  (MUCINEX ) 600 MG 12 hr tablet Take 600 mg by mouth daily.   hydrALAZINE   (APRESOLINE ) 100 MG tablet Take 0.5 tablets by mouth 3 (three) times  daily.   iron  polysaccharides (NIFEREX) 150 MG capsule Take 150 mg by mouth daily.   isosorbide  mononitrate (IMDUR ) 60 MG 24 hr tablet TAKE 1 TABLET(60 MG) BY MOUTH DAILY   latanoprost (XALATAN) 0.005 % ophthalmic solution Place 1 drop into both eyes at bedtime.   loratadine (CLARITIN) 10 MG tablet Take 10 mg by mouth daily.   nitroGLYCERIN  (NITROSTAT ) 0.4 MG SL tablet Place 1 tablet (0.4 mg total) under the tongue every 5 (five) minutes x 3 doses as needed for chest pain (if no relief after 2 nd dose, proceed to the ED or call 911).   OXYGEN  Inhale 1-2 L into the lungs continuous.   sodium bicarbonate  650 MG tablet Take 1 tablet (650 mg total) by mouth 2 (two) times daily.   Facility-Administered Encounter Medications as of 12/22/2023  Medication   0.9 %  sodium chloride  infusion    ALLERGIES:  No Known Allergies  LABORATORY DATA:  I have reviewed the labs as listed.  CBC    Component Value Date/Time   WBC 8.8 12/15/2023 1333   RBC 3.48 (L) 12/15/2023 1333   HGB 10.6 (L) 12/15/2023 1333   HCT 34.8 (L) 12/15/2023 1333   PLT 153 12/15/2023 1333   MCV 100.0 12/15/2023 1333   MCH 30.5 12/15/2023 1333   MCHC 30.5 12/15/2023 1333   RDW 13.0 12/15/2023 1333   LYMPHSABS 2.0 12/15/2023 1333   MONOABS 0.6 12/15/2023 1333   EOSABS 0.3 12/15/2023 1333   BASOSABS 0.0 12/15/2023 1333      Latest Ref Rng & Units 12/15/2023    1:33 PM 09/08/2023   12:52 PM 07/06/2023   12:44 PM  CMP  Glucose 70 - 99 mg/dL 99  898  888   BUN 8 - 23 mg/dL 57  65  48   Creatinine 0.61 - 1.24 mg/dL 7.44  7.15  7.63   Sodium 135 - 145 mmol/L 141  137  141   Potassium 3.5 - 5.1 mmol/L 4.5  4.3  3.8   Chloride 98 - 111 mmol/L 100  101  101   CO2 22 - 32 mmol/L 31  26  31    Calcium  8.9 - 10.3 mg/dL 9.1  8.7  8.7   Total Protein 6.5 - 8.1 g/dL 6.4  6.4  6.4   Total Bilirubin 0.0 - 1.2 mg/dL 0.4  0.5  1.0   Alkaline Phos 38 - 126 U/L 85  71   78   AST 15 - 41 U/L 29  25  30    ALT 0 - 44 U/L 31  30  35     DIAGNOSTIC IMAGING:  I have independently reviewed the relevant imaging and discussed with the patient.   WRAP UP:  All questions were answered. The patient knows to call the clinic with any problems, questions or concerns.  Medical decision making: Moderate  Time spent on visit: I spent 20 minutes counseling the patient face to face. The total time spent in the appointment was 30 minutes and more than 50% was on counseling.  Pleasant CHRISTELLA Barefoot, PA-C  12/22/23 11:43 AM

## 2023-12-22 ENCOUNTER — Inpatient Hospital Stay: Admitting: Physician Assistant

## 2023-12-22 VITALS — BP 117/51 | HR 52 | Temp 97.9°F | Resp 17 | Wt 211.0 lb

## 2023-12-22 DIAGNOSIS — D509 Iron deficiency anemia, unspecified: Secondary | ICD-10-CM | POA: Diagnosis not present

## 2023-12-22 DIAGNOSIS — N184 Chronic kidney disease, stage 4 (severe): Secondary | ICD-10-CM | POA: Diagnosis not present

## 2023-12-22 DIAGNOSIS — E538 Deficiency of other specified B group vitamins: Secondary | ICD-10-CM

## 2023-12-22 DIAGNOSIS — D631 Anemia in chronic kidney disease: Secondary | ICD-10-CM

## 2023-12-22 NOTE — Patient Instructions (Addendum)
 [facility] at [facility] **VISIT SUMMARY & IMPORTANT INSTRUCTIONS **   You were seen today by Pleasant Barefoot PA-C for your anemia related to chronic kidney disease.  Your iron  levels look great right now, but you still have some mild residual anemia.  This is related to your chronic kidney disease.  Your mild anemia from chronic kidney disease does not require treatment at this time.  We will recheck labs in and see you for follow-up visit in 3 months.      MEDICATIONS:  - STOP iron  pill for now. - Do NOT restart Vitamin B12 at this time.  FOLLOW-UP APPOINTMENT: 3 months  ** Thank you for trusting me with your healthcare!  I strive to provide all of my patients with quality care at each visit.  If you receive a survey for this visit, I would be so grateful to you for taking the time to provide feedback.  Thank you in advance!  ~ Breane Grunwald                                        Dr. Mickiel Davonna Pleasant Barefoot, PA-C          Delon Hope, NP   - - - - - - - - - - - - - - - - - -    Thank you for choosing [facility] at [facility] to provide your oncology and hematology care.  To afford each patient quality time with our provider, please arrive at least 15 minutes before your scheduled appointment time.   If you have a lab appointment with the Cancer Center please come in thru the Main Entrance and check in at the main information desk.  You need to re-schedule your appointment should you arrive 10 or more minutes late.  We strive to give you quality time with our providers, and arriving late affects you and other patients whose appointments are after yours.  Also, if you no show three or more times for appointments you may be dismissed from the clinic at the providers discretion.     Again, thank you for choosing [facility].  Our hope is that these requests will decrease the amount of time that you wait before  being seen by our physicians.       _____________________________________________________________  Should you have questions after your visit to [facility], please contact our office at [phone] and follow the prompts.  Our office hours are 8:00 a.m. and 4:30 p.m. Monday - Friday.  Please note that voicemails left after 4:00 p.m. may not be returned until the following business day.  We are closed weekends and major holidays.  You do have access to a nurse 24-7, just call the main number to the clinic [phone] and do not press any options, hold on the line and a nurse will answer the phone.    For prescription refill requests, have your pharmacy contact our office and allow 72 hours.

## 2024-03-21 ENCOUNTER — Inpatient Hospital Stay

## 2024-03-28 ENCOUNTER — Inpatient Hospital Stay: Admitting: Physician Assistant

## 2024-04-11 ENCOUNTER — Inpatient Hospital Stay: Attending: Oncology

## 2024-04-13 ENCOUNTER — Inpatient Hospital Stay: Admitting: Physician Assistant

## 2024-04-18 ENCOUNTER — Inpatient Hospital Stay: Admitting: Physician Assistant

## 2024-04-20 ENCOUNTER — Ambulatory Visit: Admitting: Cardiology
